# Patient Record
Sex: Female | Born: 1950 | Race: Black or African American | Hispanic: No | Marital: Married | Smoking: Never smoker
Health system: Southern US, Community
[De-identification: ages and names within clinical notes are randomized; demographics above are authoritative.]

## PROBLEM LIST (undated history)

## (undated) DIAGNOSIS — G8929 Other chronic pain: Secondary | ICD-10-CM

## (undated) DIAGNOSIS — M25569 Pain in unspecified knee: Secondary | ICD-10-CM

## (undated) DIAGNOSIS — N644 Mastodynia: Secondary | ICD-10-CM

## (undated) DIAGNOSIS — M47819 Spondylosis without myelopathy or radiculopathy, site unspecified: Secondary | ICD-10-CM

## (undated) DIAGNOSIS — F5101 Primary insomnia: Secondary | ICD-10-CM

## (undated) DIAGNOSIS — M2550 Pain in unspecified joint: Secondary | ICD-10-CM

## (undated) DIAGNOSIS — E875 Hyperkalemia: Secondary | ICD-10-CM

## (undated) DIAGNOSIS — Z1231 Encounter for screening mammogram for malignant neoplasm of breast: Secondary | ICD-10-CM

## (undated) DIAGNOSIS — F419 Anxiety disorder, unspecified: Secondary | ICD-10-CM

## (undated) DIAGNOSIS — J40 Bronchitis, not specified as acute or chronic: Secondary | ICD-10-CM

## (undated) DIAGNOSIS — J019 Acute sinusitis, unspecified: Secondary | ICD-10-CM

## (undated) DIAGNOSIS — K219 Gastro-esophageal reflux disease without esophagitis: Secondary | ICD-10-CM

## (undated) DIAGNOSIS — M47816 Spondylosis without myelopathy or radiculopathy, lumbar region: Secondary | ICD-10-CM

## (undated) DIAGNOSIS — E782 Mixed hyperlipidemia: Secondary | ICD-10-CM

## (undated) DIAGNOSIS — M5442 Lumbago with sciatica, left side: Secondary | ICD-10-CM

## (undated) DIAGNOSIS — R7989 Other specified abnormal findings of blood chemistry: Secondary | ICD-10-CM

## (undated) DIAGNOSIS — J42 Unspecified chronic bronchitis: Principal | ICD-10-CM

## (undated) DIAGNOSIS — R109 Unspecified abdominal pain: Secondary | ICD-10-CM

## (undated) DIAGNOSIS — M5416 Radiculopathy, lumbar region: Secondary | ICD-10-CM

## (undated) DIAGNOSIS — G4700 Insomnia, unspecified: Secondary | ICD-10-CM

## (undated) DIAGNOSIS — Z139 Encounter for screening, unspecified: Secondary | ICD-10-CM

## (undated) DIAGNOSIS — R413 Other amnesia: Secondary | ICD-10-CM

## (undated) DIAGNOSIS — I10 Essential (primary) hypertension: Secondary | ICD-10-CM

## (undated) DIAGNOSIS — F1910 Other psychoactive substance abuse, uncomplicated: Secondary | ICD-10-CM

## (undated) DIAGNOSIS — F329 Major depressive disorder, single episode, unspecified: Secondary | ICD-10-CM

## (undated) DIAGNOSIS — F32A Depression, unspecified: Secondary | ICD-10-CM

## (undated) DIAGNOSIS — R569 Unspecified convulsions: Secondary | ICD-10-CM

## (undated) DIAGNOSIS — G610 Guillain-Barre syndrome: Secondary | ICD-10-CM

## (undated) HISTORY — PX: JOINT REPLACEMENT: SHX530

---

## 2009-05-08 LAB — LIPID PANEL
Cholesterol, Total: 212 mg/dl — ABNORMAL HIGH (ref <200)
HDL: 40 mg/dL (ref 40–60)
LDL Calculated: 137 mg/dl — ABNORMAL HIGH (ref <100)
Triglycerides: 174 mg/dl — ABNORMAL HIGH (ref <150)
VLDL Cholesterol Calculated: 35 mg/dl

## 2009-05-08 LAB — CBC WITH AUTO DIFFERENTIAL
Basophils %: 0.7 % (ref 0.0–2.0)
Basophils Absolute: 0.1 10*3 (ref 0.0–0.2)
Eosinophils %: 2.5 % (ref 0.0–5.0)
Eosinophils Absolute: 0.2 10*3 (ref 0.0–0.6)
Granulocyte Absolute Count: 5.9 10*3 (ref 1.7–7.7)
Hematocrit: 41.6 % (ref 36.0–48.0)
Hemoglobin: 14.3 g/dL (ref 12.0–16.0)
Lymphocytes %: 33.5 % (ref 25.0–40.0)
Lymphocytes Absolute: 3.2 10*3 (ref 1.0–5.1)
MCH: 31.2 pg (ref 26–34)
MCHC: 34.5 g/dL (ref 31–36)
MCV: 90.6 fl (ref 80–100)
MPV: 7.2 fl (ref 5.0–10.5)
Monocytes %: 3.4 % (ref 0.0–8.0)
Monocytes Absolute: 0.3 10*3 (ref 0.0–0.95)
Platelets: 300 10*3 (ref 135–450)
RBC: 4.59 10*6 (ref 4.0–5.2)
RDW: 12.4 % (ref 11.5–14.5)
Segs Relative: 59.9 % (ref 42.0–63.0)
WBC: 9.7 10*3 (ref 4.0–11.0)

## 2009-05-08 LAB — MAGNESIUM: Magnesium: 2.4 mg/dL (ref 1.8–2.4)

## 2009-05-08 LAB — COMPREHENSIVE METABOLIC PANEL
ALT: 23 U/L (ref 10–40)
AST: 22 U/L (ref 15–37)
Albumin/Globulin Ratio: 1.6 (ref 1.1–2.2)
Albumin: 4.8 g/dL (ref 3.4–5.0)
Alkaline Phosphatase: 90 U/L (ref 25–100)
Anion Gap: 10
BUN: 12 mg/dL (ref 7–18)
CO2: 28 meq/L (ref 21–32)
Calcium: 9.6 mg/dL (ref 8.3–10.6)
Chloride: 105 meq/L (ref 99–110)
Creatinine: 0.7 mg/dL (ref 0.6–1.1)
GFR Est, African/Amer: >60
GFR, Estimated: >60 (ref >60)
Glucose: 117 mg/dL — ABNORMAL HIGH (ref 70–99)
Potassium: 3.9 meq/L (ref 3.5–5.1)
Sodium: 139 meq/L (ref 136–145)
Total Bilirubin: 0.6 mg/dL (ref 0.0–1.0)
Total Protein: 7.8 g/dL (ref 6.4–8.2)

## 2009-05-08 LAB — T4, FREE: T4 Free: 0.96 ng/dL (ref 0.9–1.8)

## 2009-05-08 LAB — TSH
T4 Free: 0.96 ng/dL (ref 0.9–1.8)
TSH: 0.91 u[IU]/mL (ref 0.35–5.5)

## 2009-05-08 LAB — SEDIMENTATION RATE, MANUAL: Sed Rate, Automated: 40 mm/h — ABNORMAL HIGH (ref 0–30)

## 2009-05-08 LAB — VITAMIN B12: Vitamin B-12: 559 pg/mL (ref 211–911)

## 2009-05-10 LAB — VITAMIN D 25 HYDROXY: Vit D, 25-Hydroxy: 11 ng/mL — ABNORMAL LOW (ref 30–80)

## 2010-04-28 ENCOUNTER — Emergency Department (HOSPITAL_BASED_OUTPATIENT_CLINIC_OR_DEPARTMENT_OTHER): Admission: EM | Admit: 2010-04-28 | Discharge: 2010-04-28 | Payer: Self-pay | Admitting: Emergency Medicine

## 2010-05-03 ENCOUNTER — Emergency Department (HOSPITAL_BASED_OUTPATIENT_CLINIC_OR_DEPARTMENT_OTHER): Admission: EM | Admit: 2010-05-03 | Discharge: 2010-05-03 | Payer: Self-pay | Admitting: Emergency Medicine

## 2010-07-21 LAB — LIPID PANEL
Cholesterol, Total: 268 mg/dl — ABNORMAL HIGH (ref <200)
HDL: 37 mg/dl — ABNORMAL LOW (ref 40–60)
LDL Direct: 168 mg/dl — ABNORMAL HIGH (ref <100)
Triglycerides: 344 mg/dl — ABNORMAL HIGH (ref <150)

## 2010-07-21 LAB — COMPREHENSIVE METABOLIC PANEL
ALT: 24 U/L (ref 10–40)
AST: 20 U/L (ref 15–37)
Albumin/Globulin Ratio: 1.7 (ref 1.1–2.2)
Albumin: 4.6 gm/dl (ref 3.4–5.0)
Alkaline Phosphatase: 80 U/L (ref 45–129)
Anion Gap: 6.7
BUN: 13 mg/dl (ref 7–18)
CO2: 28 mEq/L (ref 21–32)
Calcium: 9.8 mg/dl (ref 8.3–10.6)
Chloride: 106 mEq/L (ref 99–110)
Creatinine: 0.6 mg/dl (ref 0.6–1.1)
GFR Est, African/Amer: >60
GFR, Estimated: >60 (ref >60)
Glucose: 106 mg/dl — ABNORMAL HIGH (ref 70–99)
Potassium: 4.5 mEq/L (ref 3.5–5.1)
Sodium: 141 mEq/L (ref 136–145)
Total Bilirubin: 0.5 mg/dl (ref 0.0–1.0)
Total Protein: 7.3 gm/dl (ref 6.4–8.2)

## 2010-07-22 LAB — VITAMIN D 25 HYDROXY: Vit D, 25-Hydroxy: 24 ng/ml — ABNORMAL LOW (ref 30–80)

## 2012-12-07 ENCOUNTER — Encounter

## 2013-06-28 NOTE — Patient Instructions (Addendum)
INSTRUCTED PT NOT TO TAKE VALIUM AND RESTORIL AT THE SAME TIME.   Cichy therapy recommended for comfort.       Arthritis: After Your Visit  Your Care Instructions  Arthritis, also called osteoarthritis, is a breakdown of the cartilage that cushions your joints. When the cartilage wears down, your bones rub against each other. This causes pain and stiffness. Many people have some arthritis as they age. Arthritis most often affects the joints of the spine, hands, hips, knees, or feet.  You can take simple measures to protect your joints, ease your pain, and help y  Learning About Sleeping Well  What does sleeping well mean?  Sleeping well means getting enough sleep. How much sleep is enough varies among people.  The number of hours you sleep is not as important as how you feel when you wake up. If you do not feel refreshed, you probably need more sleep. Another sign of not getting enough sleep is feeling tired during the day.  The average total nightly sleep time is 7?? to 8 hours. Healthy adults may need a little more or a little less than this.  Why is getting enough sleep important?  Getting enough quality sleep is a basic part of good health. When your sleep suffers, your mood and your thoughts can suffer too. You may find yourself feeling more grumpy or stressed. Not getting enough sleep also can lead to serious problems, including injury, accidents, anxiety, and depression.  What might cause poor sleeping?  Many things can cause sleep problems, including:  ?? Stress. Stress can be caused by fear about a single event, such as giving a speech. Or you may have ongoing stress, such as worry about work or school.  ?? Depression, anxiety, and other mental or emotional conditions.  ?? Changes in your sleep habits or surroundings. This includes changes that happen where you sleep, such as noise, light, or sleeping in a different bed. It also includes changes in your sleep pattern, such as having jet lag or working a late  shift.  ?? Health problems, such as pain, breathing problems, and restless legs syndrome.  ?? Lack of regular exercise.  How can you help yourself?  Here are some tips that may help you sleep more soundly and wake up feeling more refreshed.  Your sleeping area  ?? Use your bedroom only for sleeping and sex. A bit of light reading may help you fall asleep. But if it doesn't, do your reading elsewhere in the house. Don't watch TV in bed.  ?? Be sure your bed is big enough to stretch out comfortably, especially if you have a sleep partner.  ?? Keep your bedroom quiet, dark, and cool. Use curtains, blinds, or a sleep mask to block out light. To block out noise, use earplugs, soothing music, or a "white noise" machine.  Your evening and bedtime routine  ?? Get regular exercise, but not within 3 to 4 hours before your bedtime.  ?? Create a relaxing bedtime routine. You might want to take a warm shower or bath, listen to soothing music, or drink a cup of noncaffeinated tea.  ?? Go to bed at the same time every night. And get up at the same time every morning, even if you feel tired.  What to avoid  ?? Limit caffeine (coffee, tea, caffeinated sodas) during the day, and don't have any for at least 4 to 6 hours before bedtime.  ?? Don't drink alcohol before bedtime. Alcohol can  cause you to wake up more often during the night.  ?? Don't smoke or use tobacco, especially in the evening. Nicotine can keep you awake.  ?? Don't take naps during the day, especially close to bedtime.  ?? Don't lie in bed awake for too long. If you can't fall asleep, or if you wake up in the middle of the night and can't get back to sleep within 15 minutes or so, get out of bed and go to another room until you feel sleepy.  ?? Don't take medicine right before bed that may keep you awake or make you feel hyper or energized. Your doctor can tell you if your medicine may do this and if you can take it earlier in the day.  If you can't sleep  ?? Imagine yourself in a  peaceful, pleasant scene. Focus on the details and feelings of being in a place that is relaxing.  ?? Get up and do a quiet or boring activity until you feel sleepy.  ?? Don't drink any liquids after 6 p.m. if you wake up often because you have to go to the bathroom.   Where can you learn more?   Go to [email or web address] and sign in to your MyChart account. Enter [phone] in the Search Health Information box to learn more about ???Learning About Sleeping Well.???    If you do not have an account, please click on the ???Sign Up Now??? link.     ?? 2006-2014 [employer]. Care instructions adapted under license by [facility]. This care instruction is for use with your licensed healthcare professional. If you have questions about a medical condition or this instruction, always ask your healthcare professional. Healthwise, Incorporated disclaims any warranty or liability for your use of this information.  Content Version: 10.0.273164; Last Revised: December 02, 2010            ou stay active.  Follow-up care is a key part of your treatment and safety. Be sure to make and go to all appointments, and call your doctor if you are having problems. It's also a good idea to know your test results and keep a list of the medicines you take.  How can you care for yourself at home?  ?? Stay at a healthy weight. Being overweight puts extra strain on your joints.  ?? Talk to your doctor or physical therapist about exercises that will help ease joint pain.  ?? Stretch. You may enjoy gentle forms of yoga to help keep your joints and muscles flexible.  ?? Walk instead of jog. Other types of exercise that are less stressful on the joints include riding a bicycle, swimming, or water exercise.  ?? Lift weights. Strong muscles help reduce stress on your joints. Stronger thigh muscles, for example, take some of the stress off of the knees and hips. Learn the right way to lift weights so you do not make joint pain  worse.  ?? Take your medicines exactly as prescribed. Call your doctor if you think you are having a problem with your medicine.  ?? Take pain medicines exactly as directed.  ?? If the doctor gave you a prescription medicine for pain, take it as prescribed.  ?? If you are not taking a prescription pain medicine, ask your doctor if you can take an over-the-counter medicine.  ?? Use a cane, crutch, walker, or another device if you need help to get around. These can help rest your joints. You also can use other  things to make life easier, such as a higher toilet seat and padded handles on kitchen utensils.  ?? Do not sit in low chairs, which can make it hard to get up.  ?? Put heat or cold on your sore joints as needed. Use whichever helps you most. You also can take turns with hot and cold packs.  ?? Apply heat 2 or 3 times a day for 20 to 30 minutes--using a heating pad, hot shower, or hot pack--to relieve pain and stiffness.  ?? Put ice or a cold pack on your sore joint for 10 to 20 minutes at a time. Put a thin cloth between the ice and your skin.  ?? Your doctor may suggest you try the supplements glucosamine and chondroitin. They are part of what makes up normal cartilage. Tell your doctor if you have diabetes, allergies to shellfish, or if you take warfarin (Coumadin).  When should you call for help?  Call your doctor now or seek immediate medical care if:  ?? You have sudden swelling, warmth, or pain in any joint.  ?? You have joint pain and a fever or rash.  ?? You have such bad pain that you cannot use a joint.  Watch closely for changes in your health, and be sure to contact your doctor if:  ?? You have mild joint symptoms that continue even with more than 6 weeks of care at home.  ?? You have stomach pain or other problems with your medicine.   Where can you learn more?   Go to [email or web address] and sign in to your MyChart account. Enter [phone] in the Search Health Information box to learn more about  ???Arthritis: After Your Visit.???    If you do not have an account, please click on the ???Sign Up Now??? link.     ?? 2006-2014 Healthwise, Inc[employer]. Care instructions adapted under license by [facility]. This care instruction is for use with your licensed healthcare professional. If you have questions about a medical condition or this instruction, always ask your healthcare professional. Healthwise, Incorporated disclaims any warranty or liability for your use of this information.  Content Version: 10.0.273164; Last Revised: May 13, 2012              diazepam  Pronunciation: dye AZ e pam  Brand: Valium  What is the most important information I should know about diazepam?    You should not use this medication if you are allergic to diazepam or similar medicines (Ativan, Klonopin, Restoril, Xanax, and others), or if you have myasthenia gravis, severe liver disease, narrow-angle glaucoma, a severe breathing problem, or sleep apnea.    Do not use diazepam if you are pregnant.  It could harm the unborn baby.    Do not start or stop taking diazepam during pregnancy without your doctor's advice. Diazepam may cause harm to an unborn baby, but having a seizure during pregnancy could harm both the mother and the baby. Tell your doctor right away if you become pregnant while taking diazepam for seizures.  Before you take diazepam, tell your doctor if you have glaucoma, asthma or other breathing problems, kidney or liver disease, seizures, or a history of drug or alcohol addiction, mental illness, depression, or suicidal thoughts.    Do not drink alcohol while taking diazepam.  This medication can increase the effects of alcohol.  Never take more of this medication than your doctor has prescribed. An overdose of diazepam can be fatal.  Diazepam may be habit forming. Never share diazepam with another person, especially someone with a history of drug abuse or addiction.  What is diazepam?  Diazepam is a  benzodiazepine (ben-zoe-dye-AZE-eh-peens). Diazepam affects chemicals in the brain that may become unbalanced and cause anxiety.  Diazepam is used to treat anxiety disorders, alcohol withdrawal symptoms, or muscle spasms. Diazepam is sometimes used with other medications to treat seizures.  Diazepam may also be used for purposes not listed in this medication guide.  What should I discuss with my healthcare provider before taking diazepam?    You should not use this medication if you are allergic to diazepam or similar drugs (Ativan, Klonopin, Restoril, Xanax, and others), or if you have:  ?? myasthenia gravis (a muscle weakness disorder);  ?? severe liver disease;  ?? narrow-angle glaucoma;  ?? a severe breathing problem; or  ?? sleep apnea (breathing stops during sleep).  To make sure diazepam is safe for you, tell your doctor if you have any of these conditions:  ?? open-angle glaucoma;  ?? asthma, emphysema, bronchitis, chronic obstructive pulmonary disorder (COPD), or other breathing problems;  ?? kidney or liver disease;  ?? epilepsy or other seizure disorder;  ?? a history of mental illness, depression, or suicidal thoughts or behavior; or  ?? a history of drug or alcohol addiction.    Diazepam may be habit forming. Never share diazepam with another person, especially someone with a history of drug abuse or addiction. Keep the medication in a place where others cannot get to it.    FDA pregnancy category D. Do not use diazepam if you are pregnant.  It could harm the unborn baby. Use effective birth control, and tell your doctor if you become pregnant during treatment. Diazepam may cause low blood pressure, breathing problems, or addiction and withdrawal symptoms in a newborn if the mother takes the medication during pregnancy.    Do not start or stop taking diazepam during pregnancy without your doctor's advice. Diazepam may cause harm to an unborn baby, but having a seizure during pregnancy could harm both the mother  and the baby. Tell your doctor right away if you become pregnant while taking diazepam for seizures.    Diazepam can pass into breast milk and may harm a nursing baby. You should not breast-feed while using this medicine.    Do not give this medication to a child younger than 39 months old.  The sedative effects of diazepam may last longer in older adults. Accidental falls are common in elderly patients who take benzodiazepines. Use caution to avoid falling or accidental injury while you are taking diazepam.  How should I take diazepam?  Follow all directions on your prescription label. Your doctor may occasionally change your dose to make sure you get the best results. Do not take this medicine in larger or smaller amounts or for longer than recommended.  Measure liquid medicine with a special dose-measuring spoon or medicine cup. If you do not have a dose-measuring device, ask your pharmacist for one.  Diazepam should be used for only a short time. Do not take this medication for longer than 12 weeks (3 months) without your doctor's advice.  Do not stop using diazepam suddenly, or you could have increased seizures or unpleasant withdrawal symptoms. Ask your doctor how to safely stop using diazepam.    Call your doctor at once if you feel that this medicine is not working as well as usual, or if you think you need to  use more than usual.  While using diazepam, you may need frequent blood tests at your doctor's office.    Store at room temperature away from moisture, heat, and light.  Keep track of the amount of medicine used from each new bottle. Diazepam is a drug of abuse and you should be aware if anyone is using your medicine improperly or without a prescription.  What happens if I miss a dose?  Take the missed dose as soon as you remember. Skip the missed dose if it is almost time for your next scheduled dose. Do not take extra medicine to make up the missed dose.  What happens if I overdose?    Seek emergency  medical attention or call the Poison Help line at [phone]. An overdose of diazepam can be fatal.  Overdose symptoms may include extreme drowsiness, loss of balance or coordination, limp or weak muscles, or fainting.  What should I avoid while taking diazepam?    This medication may impair your thinking or reactions. Be careful if you drive or do anything that requires you to be alert.    Do not drink alcohol while taking diazepam.  This medication can increase the effects of alcohol.  Grapefruit and grapefruit juice may interact with diazepam and lead to unwanted side effects. Discuss the use of grapefruit products with your doctor.  What are the possible side effects of diazepam?    Get emergency medical help if you have any of these signs of an allergic reaction: hives; difficult breathing; swelling of your face, lips, tongue, or throat.    Call your doctor at once if you have:  ?? confusion, hallucinations, unusual thoughts or behavior;  ?? unusual risk-taking behavior, decreased inhibitions, no fear of danger;  ?? depressed mood, thoughts of suicide or hurting yourself;  ?? hyperactivity, agitation, aggression, hostility;  ?? new or worsening seizures;  ?? weak or shallow breathing;  ?? a feeling like you might pass out;  ?? muscle twitching, tremor;  ?? loss of bladder control; or  ?? little or no urinating.  Common side effects may include:  ?? memory problems;  ?? drowsiness, tired feeling;  ?? dizziness, spinning sensation;  ?? feeling restless or irritable;  ?? muscle weakness;  ?? nausea, constipation;  ?? drooling or dry mouth, slurred speech;  ?? blurred vision, double vision;  ?? mild skin rash, itching; or  ?? loss of interest in sex.  This is not a complete list of side effects and others may occur. Call your doctor for medical advice about side effects. You may report side effects to FDA at 1-800-FDA-1088. You may report side effects to FDA at 1-800-FDA-1088.  What other drugs will affect diazepam?    Taking  diazepam with other drugs that make you sleepy or slow your breathing can cause dangerous or life-threatening side effects. Ask your doctor before taking diazepam with a sleeping pill, narcotic pain medicine, muscle relaxer, or medicine for anxiety, depression, or seizures.  Tell your doctor about all medicines you use, and those you start or stop using during your treatment with diazepam, especially:  ?? cimetidine;  ?? omeprazole;  ?? phenytoin;  ?? an antibiotic--clarithromycin, erythromycin, telithromycin;  ?? an antidepressant such as fluoxetine, fluoxetine, and others;  ?? antifungal medicine--itraconazole, ketoconazole, voriconazole;  ?? heart or blood pressure medication such as diltiazem, nicardipine, quinidine, verapamil, and others; or  ?? HIV/AIDS medicine--atazanavir, delavirdine, fosamprenavir, indinavir, nelfinavir, saquinavir, or ritonavir.  This list is not complete. Other drugs may  interact with diazepam, including prescription and over-the-counter medicines, vitamins, and herbal products. Not all possible interactions are listed in this medication guide.  Where can I get more information?  Your pharmacist can provide more information about diazepam.    Remember, keep this and all other medicines out of the reach of children, never share your medicines with others, and use this medication only for the indication prescribed.  Every effort has been made to ensure that the information provided by [employer]') is accurate, up-to-date, and complete, but no guarantee is made to that effect. Drug information contained herein may be time sensitive. [employer] information has been compiled for use by healthcare practitioners and consumers in the [place] and therefore [employer] does not warrant that uses outside of the [place] are appropriate, unless specifically indicated otherwise. [employer]'s drug information does not endorse drugs, diagnose patients or recommend therapy. [employer]'s drug  information is an [employer] corporate to assist licensed healthcare practitioners in caring for their patients and/or to serve consumers viewing this service as a supplement to, and not a substitute for, the expertise, skill, knowledge and judgment of healthcare practitioners. The absence of a warning for a given drug or drug combination in no way should be construed to indicate that the drug or drug combination is safe, effective or appropriate for any given patient. [employer] does not assume any responsibility for any aspect of healthcare administered with the aid of information [employer] provides. The information contained herein is not intended to cover all possible uses, directions, precautions, warnings, drug interactions, allergic reactions, or adverse effects. If you have questions about the drugs you are taking, check with your doctor, nurse or pharmacist.  Copyright [id number] [employer] Version: 12.01. Revision date: 01/14/2012.  This information does not replace the advice of a doctor. Healthwise, Incorporated disclaims any warranty or liability for your use of this information.   Content Version: 10.0.273164        temazepam  Pronunciation: te MAZ e pam  Brand: Restoril  What is the most important information I should know about temazepam?    Temazepam may cause a severe allergic reaction. Stop taking temazepam and get emergency medical help if you have any of these signs of an allergic reaction: hives; difficulty breathing; swelling of your face, lips, tongue, or throat.    Take temazepam only when you are getting ready for several hours of sleep.  You may fall asleep very quickly after taking the medicine.  Some people using this medicine have engaged in activity such as driving, eating, or making phone calls and later having no memory of the activity.  If this happens to you, stop taking temazepam and talk with your doctor about another treatment for your sleep disorder.    Do not use  this medication if you are allergic to temazepam or to other benzodiazepines, such as alprazolam (Xanax), chlordiazepoxide (Librium), clorazepate (Tranxene), diazepam (Valium), lorazepam (Ativan), or triazolam (Halcion).    This medication can cause birth defects in an unborn baby, or withdrawal symptoms in a newborn. Do not use temazepam if you are pregnant.  Before taking temazepam, tell your doctor if you have any breathing problems, glaucoma, kidney or liver disease, myasthenia gravis, or a history of depression, suicidal thoughts, or addiction to drugs or alcohol.    Do not drink alcohol while taking temazepam. It can increase some of the side effects, and could possibly cause a fatal overdose.  Avoid using other medicines that make  you sleepy. They can add to sleepiness caused by temazepam.    Temazepam may be habit-forming and should be used only by the person it was prescribed for. Temazepam should never be shared with another person, especially someone who has a history of drug abuse or addiction. Keep the medication in a secure place where others cannot get to it.  What is temazepam?  Temazepam is in a group of drugs called benzodiazepines (ben-zoe-dye-AZE-eh-peens). Temazepam affects chemicals in the brain that may become unbalanced and cause sleep problems (insomnia).  Temazepam is used to treat insomnia symptoms, such as trouble falling or staying asleep.  Temazepam may also be used for other purposes not listed in this medication guide.  What should I discuss with my healthcare provider before taking temazepam?  Some people using this medicine have engaged in activity such as driving, eating, or making phone calls and later having no memory of the activity.  If this happens to you, stop taking temazepam and talk with your doctor about another treatment for your sleep disorder.    Do not use this medication if you are allergic to temazepam or to other benzodiazepines, such as alprazolam (Xanax),  chlordiazepoxide (Librium), clorazepate (Tranxene), diazepam (Valium), lorazepam (Ativan), or triazolam (Halcion).  Before taking temazepam, tell your doctor if you are allergic to any drugs, or if you have:  ?? asthma, emphysema, bronchitis, chronic obstructive pulmonary disorder (COPD), or other breathing problems;  ?? glaucoma;  ?? kidney or liver disease;  ?? myasthenia gravis;  ?? a history of depression or suicidal thoughts or behavior; or  ?? a history of drug or alcohol addiction.  If you have any of these conditions, you may need a dose adjustment or special tests to safely take temazepam.    Temazepam can cause birth defects in an unborn baby.  It may also cause addiction or withdrawal symptoms in a newborn if the mother takes temazepam late in pregnancy. Do not use temazepam if you are pregnant. Tell your doctor right away if you become pregnant during treatment. Use an effective form of birth control while you are using this medication.    Temazepam may pass into breast milk and could harm a nursing baby. Do not use this medication without telling your doctor if you are breast-feeding a baby.  The sedative effects of temazepam may last longer in older adults. Accidental falls are common in elderly patients who take benzodiazepines. Use caution to avoid falling or accidental injury while you are taking temazepam.    Do not give this medication to anyone under 19 years old.  How should I take temazepam?  Take this medication exactly as it was prescribed for you. Do not take the medication in larger amounts, or take it for longer than recommended by your doctor. Follow the directions on your prescription label.    Take temazepam only when you are getting ready for several hours of sleep.  You may fall asleep very quickly after taking the medicine.    Contact your doctor if this medicine seems to stop working as well in helping you fall asleep and stay asleep.    Temazepam should be used for only a short time to  treat insomnia. After 7 to 10 nights of use, talk with your doctor about whether or not you should keep taking temazepam.  Your insomnia symptoms may return when you stop using temazepam after using it over a long period of time. You may need to use less and less  before you stop the medication completely.    Temazepam may be habit-forming and should be used only by the person it was prescribed for. Temazepam should never be shared with another person, especially someone who has a history of drug abuse or addiction. Keep the medication in a secure place where others cannot get to it.    Store temazepam at room temperature away from moisture and heat.  Keep track of how many tablets have been used from each new bottle of this medicine. Benzodiazepines are drugs of abuse and you should be aware if any person in the household is using this medicine improperly or without a prescription.  What happens if I miss a dose?  Since temazepam is taken as needed, you are not likely to be on a dosing schedule. Take temazepam only when you have time for several hours of sleep.  What happens if I overdose?    Seek emergency medical attention if you think you have used too much of this medicine. An overdose of temazepam can be fatal, especially if taken with alcohol.  Overdose symptoms may include extreme drowsiness, confusion, muscle weakness, slurred speech, tremors, a slow heartbeat, shallow breathing, feeling light-headed, fainting, seizure (black-out or convulsions), and coma.  What should I avoid while taking temazepam?    Do not drink alcohol while you are taking temazepam. It can increase some of the side effects, and could possibly cause a fatal overdose.    Temazepam can cause side effects that may impair your thinking or reactions. Be careful if you drive or do anything that requires you to be awake and alert.  What are the possible side effects of temazepam?    Temazepam may cause a severe allergic reaction. Stop taking  temazepam and get emergency medical help if you have any of these signs of an allergic reaction: hives; difficulty breathing; swelling of your face, lips, tongue, or throat.    Get emergency medical help if you have any of these signs of an allergic reaction: hives; difficulty breathing; swelling of your face, lips, tongue, or throat.    Stop using temazepam and call your doctor at once if you have any of these serious side effects:  ?? weak or shallow breathing;  ?? fast or pounding heartbeats;  ?? confusion, slurred speech, unusual thoughts or behavior;  ?? hallucinations, agitation, aggression;  ?? thoughts of suicide or hurting yourself;  ?? restless muscle movements in your eyes, tongue, jaw, or neck;  ?? pale skin, easy bruising or bleeding, unusual weakness;  ?? fever, chills, body aches, flu symptoms;  ?? problems with urination; or  ?? nausea, stomach pain, low fever, loss of appetite, dark urine, clay-colored stools, jaundice (yellowing of the skin or eyes).  Less serious side effects may include:  ?? daytime drowsiness (or during hours when you are not normally sleeping);  ?? amnesia or forgetfulness;  ?? muscle weakness, lack of balance or coordination;  ?? numbness, burning, pain, or tingly feeling;  ?? headache, blurred vision, depressed mood;  ?? feeling nervous, excited, or irritable;  ?? nausea, vomiting, stomach discomfort; or  ?? dry mouth, increased thirst.  This is not a complete list of side effects and others may occur. Tell your doctor about any unusual or bothersome side effect. You may report side effects to FDA at 1-800-FDA-1088.  What other drugs will affect temazepam?    Before using temazepam, tell your doctor if you regularly use other medicines that make you sleepy (such as cold  or allergy medicine, narcotic pain medicine, sleeping pills, muscle relaxers, and medicine for seizures, depression, or anxiety). They can add to sleepiness caused by temazepam.  Before taking temazepam, tell your doctor if you  are using any of the following drugs:  ?? fluvoxamine (Luvox);  ?? itraconazole (Sporanox);  ?? ketoconazole (Nizoral); or  ?? nefazodone (Serzone).  This list is not complete and there may be other drugs that can interact with temazepam. Tell your doctor about all the prescription and over-the-counter medications you use. This includes vitamins, minerals, herbal products, and drugs prescribed by other doctors. Do not start using a new medication without telling your doctor.  Where can I get more information?  Your pharmacist can provide more information about temazepam.    Remember, keep this and all other medicines out of the reach of children, never share your medicines with others, and use this medication only for the indication prescribed.  Every effort has been made to ensure that the information provided by [employer]') is accurate, up-to-date, and complete, but no guarantee is made to that effect. Drug information contained herein may be time sensitive. [employer] information has been compiled for use by healthcare practitioners and consumers in the [place] and therefore [employer] does not warrant that uses outside of the [place] are appropriate, unless specifically indicated otherwise. [employer]'s drug information does not endorse drugs, diagnose patients or recommend therapy. [employer]'s drug information is an [employer] corporate to assist licensed healthcare practitioners in caring for their patients and/or to serve consumers viewing this service as a supplement to, and not a substitute for, the expertise, skill, knowledge and judgment of healthcare practitioners. The absence of a warning for a given drug or drug combination in no way should be construed to indicate that the drug or drug combination is safe, effective or appropriate for any given patient. [employer] does not assume any responsibility for any aspect of healthcare administered with the aid of information [employer]  provides. The information contained herein is not intended to cover all possible uses, directions, precautions, warnings, drug interactions, allergic reactions, or adverse effects. If you have questions about the drugs you are taking, check with your doctor, nurse or pharmacist.  Copyright [phone] [employer] Version: 8.03. Revision date: 12/11/2009.  This information does not replace the advice of a doctor. [employer] disclaims any warranty or liability for your use of this information.   Content Version: 10.0.273164        Anxiety Disorder: After Your Visit  Your Care Instructions  Anxiety is a normal reaction to stress. Difficult situations can cause you to have symptoms such as sweaty palms and a nervous feeling.  In an anxiety disorder, the symptoms are far more severe. Constant worry, muscle tension, trouble sleeping, nausea and diarrhea, and other symptoms can make normal daily activities difficult or impossible. These symptoms may occur for no reason, and they can affect your work, school, or social life. Medicines, counseling, and self-care can all help.  Follow-up care is a key part of your treatment and safety. Be sure to make and go to all appointments, and call your doctor if you are having problems. It's also a good idea to know your test results and keep a list of the medicines you take.  How can you care for yourself at home?  ?? Take medicines exactly as directed. Call your doctor if you think you are having a problem with your medicine.  ?? Go to your counseling  sessions and follow-up appointments.  ?? Recognize and accept your anxiety. Then, when you are in a situation that makes you anxious, say to yourself, "This is not an emergency. I feel uncomfortable, but I am not in danger. I can keep going even if I feel anxious."  ?? Be kind to your body:  ?? Relieve tension with exercise or a massage.  ?? Get enough rest.  ?? Avoid alcohol, caffeine, nicotine, and illegal drugs. They can  increase your anxiety level and cause sleep problems.  ?? Learn and do relaxation techniques. See below for more about these techniques.  ?? Engage your mind. Get out and do something you enjoy. Go to a funny movie, or take a walk or hike. Plan your day. Having too much or too little to do can make you anxious.  ?? Keep a record of your symptoms. Discuss your fears with a good friend or family member, or join a support group for people with similar problems. Talking to others sometimes relieves stress.  ?? Get involved in social groups, or volunteer to help others. Being alone sometimes makes things seem worse than they are.  ?? Get at least 30 minutes of exercise on most days of the week to relieve stress. Walking is a good choice. You also may want to do other activities, such as running, swimming, cycling, or playing tennis or team sports.  Relaxation techniques  Do relaxation exercises 10 to 20 minutes a day. You can play soothing, relaxing music while you do them, if you wish.  ?? Tell others in your house that you are going to do your relaxation exercises. Ask them not to disturb you.  ?? Find a comfortable place, away from all distractions and noise.  ?? Lie down on your back, or sit with your back straight.  ?? Focus on your breathing. Make it slow and steady.  ?? Breathe in through your nose. Breathe out through either your nose or mouth.  ?? Breathe deeply, filling up the area between your navel and your rib cage. Breathe so that your belly goes up and down.  ?? Do not hold your breath.  ?? Breathe like this for 5 to 10 minutes. Notice the feeling of calmness throughout your whole body.  As you continue to breathe slowly and deeply, relax by doing the following for another 5 to 10 minutes:  ?? Tighten and relax each muscle group in your body. You can begin at your toes and work your way up to your head.  ?? Imagine your muscle groups relaxing and becoming heavy.  ?? Empty your mind of all thoughts.  ?? Let yourself relax  more and more deeply.  ?? Become aware of the state of calmness that surrounds you.  ?? When your relaxation time is over, you can bring yourself back to alertness by moving your fingers and toes and then your hands and feet and then stretching and moving your entire body. Sometimes people fall asleep during relaxation, but they usually wake up shortly afterward.  ?? Always give yourself time to return to full alertness before you drive a car or do anything that might cause an accident if you are not fully alert. Never play a relaxation tape while you drive a car.  When should you call for help?  Call 911 anytime you think you may need emergency care. For example, call if:  ?? You feel you cannot stop from hurting yourself or someone else.  Watch closely for  changes in your health, and be sure to contact your doctor if:  ?? You have anxiety or fear that affects your life.  ?? You have symptoms of anxiety that are new or different from those you had before.   Where can you learn more?   Go to [email or web address] and sign in to your MyChart account. Enter P754 in the Search Health Information box to learn more about ???Anxiety Disorder: After Your Visit.???    If you do not have an account, please click on the ???Sign Up Now??? link.     ?? 2006-2014 [employer]. Care instructions adapted under license by [facility]. This care instruction is for use with your licensed healthcare professional. If you have questions about a medical condition or this instruction, always ask your healthcare professional. Health[employer], Incorporated disclaims any warranty or liability for your use of this information.  Content Version: 10.0.273164; Last Revised: August 02, 2012

## 2013-06-28 NOTE — Progress Notes (Signed)
Subjective:      Patient ID: Lisa York is a 62 y.o. female.    Leg Pain   The incident occurred more than 1 week ago. There was no injury mechanism. The pain is present in the left knee, left leg, left ankle and left foot. The quality of the pain is described as aching. The pain is at a severity of 8/10. The pain is moderate. The pain has been fluctuating since onset. She reports no foreign bodies present. Nothing aggravates the symptoms.     Pt c/o left leg knee pain radiating down leg for "a while." Hx left knee surgery 6-7 years ago. Denies any acute injury. Had xray of left knee in the past 6 months with no acute changes. Has taken aleve, tylenol. Ibuprofen with no relief. Has tried ice with some relief but not totally gone.      Anxiety  Patient is here for evaluation of anxiety.  She has the following anxiety symptoms: insomnia, racing thoughts, shaking. Onset of symptoms was approximately several years ago.  Symptoms have been gradually worsening since that time. She denies current suicidal and homicidal ideation. Family history significant for anxiety and depression.Possible organic causes contributing are: none. Risk factors: positive family history in  brother(s) and sister(s) Previous treatment includes medication Prozac.   She complains of the following medication side effects: none.    Pt reports that she has had increasing anxiety over the past year. Lost her husband 1 year ago. States that she used to take valium once a day over 10 years ago and it helped. States that she stopped taking it because she didn't think that she needed it. Also reports that she has difficulty sleeping.     Review of Systems   Constitutional: Negative for fever, chills and fatigue.   HENT: Negative.    Eyes: Negative.    Respiratory: Negative.  Negative for chest tightness and shortness of breath.    Cardiovascular: Negative.    Gastrointestinal: Negative.    Endocrine: Negative.    Genitourinary: Negative.     Musculoskeletal: Positive for myalgias (left knee pain). Negative for back pain and joint swelling.   Skin: Negative.    Allergic/Immunologic: Negative.    Neurological: Negative for light-headedness and headaches.   Hematological: Negative.    Psychiatric/Behavioral: Positive for sleep disturbance (difficulty sleeping). Negative for confusion. The patient is nervous/anxious (increasing since husband's death).        Objective:   Physical Exam   Constitutional: She is oriented to person, place, and time. Vital signs are normal. She appears well-developed and well-nourished. No distress.   HENT:   Head: Normocephalic.   Right Ear: Hearing, tympanic membrane, external ear and ear canal normal.   Left Ear: Hearing, tympanic membrane, external ear and ear canal normal.   Nose: Nose normal.   Mouth/Throat: Uvula is midline.   Eyes: Conjunctivae are normal. Pupils are equal, round, and reactive to light.   Neck: Trachea normal and normal range of motion. Neck supple. No thyromegaly present.   Cardiovascular: Normal rate, regular rhythm, S1 normal, S2 normal and normal heart sounds.    Pulmonary/Chest: Effort normal and breath sounds normal. No respiratory distress. She has no wheezes.   Abdominal: Soft. Bowel sounds are normal. She exhibits no distension. There is no tenderness.   Musculoskeletal:        Left knee: She exhibits decreased range of motion. She exhibits no swelling. Tenderness found.   Neurological: She is alert and  oriented to person, place, and time.   Skin: Skin is warm, dry and intact. No rash noted. She is not diaphoretic.   Psychiatric: Her speech is normal and behavior is normal. Her mood appears anxious. She exhibits a depressed mood. She expresses no homicidal and no suicidal ideation. She expresses no suicidal plans and no homicidal plans.     BP 158/90   Pulse 110   Wt 184 lb 12.8 oz (83.825 kg)   SpO2 94%    Assessment/Plan      Lisa York was seen today for leg pain and anxiety.    Diagnoses and  associated orders for this visit:    Arthritis: Previous left knee xray revealed mild to moderate arthritis. Pt performs ROM exercises in swimming pool daily. Meds as below. Discussed Obryan therapy and f/u if no relief.    - meloxicam (MOBIC) 15 MG tablet; Take 1 tablet by mouth daily.    Anxiety: Increasing anxiety over the past year after husband's death. Refilled Paxil. Prescribed Valium. Instructed pt not to take with Restoril. Pt verbalized understanding. Pt to f/u if symprtoms do not improve.   - PARoxetine (PAXIL) 40 MG tablet; Take 1 tablet by mouth daily.  - diazepam (VALIUM) 2 MG tablet; Take 1 tablet by mouth every 12 hours as needed for Anxiety for 10 days.    Insomnia, persistent: C/O increasing difficulty sleeping. Recommended taking Paxil in the morning instead of at night. Had taken Restoril in the past with good results. Prescribed Restoril. F/u if no improvement.   - temazepam (RESTORIL) 15 MG capsule; Take 1 capsule by mouth nightly as needed for Sleep for 14 days.

## 2013-08-07 NOTE — Patient Instructions (Addendum)
Knee Pain: After Your Visit  Your Care Instructions  Overuse is often the cause of knee pain. Other causes are climbing stairs, kneeling, or other activities that use the knee. Everyday wear and tear, especially as you get older, also can cause knee pain.  Rest, along with home treatment, often relieves pain and allows your knee to heal. If you have a serious knee injury, you may need tests and treatment.  Follow-up care is a key part of your treatment and safety. Be sure to make and go to all appointments, and call your doctor if you are having problems. It???s also a good idea to know your test results and keep a list of the medicines you take.  How can you care for yourself at home?  ?? Take pain medicines exactly as directed.  ?? If the doctor gave you a prescription medicine for pain, take it as prescribed.  ?? If you are not taking a prescription pain medicine, ask your doctor if you can take an over-the-counter medicine.  ?? Do not take two or more pain medicines at the same time unless the doctor told you to. Many pain medicines contain acetaminophen, which is Tylenol. Too much acetaminophen (Tylenol) can be harmful.  ?? Rest and protect your knee. Take a break from any activity that may cause pain.  ?? Put ice or a cold pack on your knee for 10 to 20 minutes at a time. Put a thin cloth between the ice and your skin.  ?? Prop up a sore knee on a pillow when you ice it or anytime you sit or lie down for the next 3 days. Try to keep it above the level of your heart. This will help reduce swelling.  ?? If your doctor recommends an elastic bandage, sleeve, or other type of support for your knee, wear it as directed.  ?? If your knee is not swollen, you can put moist heat, a heating pad, or a warm cloth on your knee.  ?? After several days of rest, you can begin gentle exercise of your knee.  ?? Reach and stay at a healthy weight. Extra weight can strain the joints, especially the knees and hips, and make the pain worse.  Losing even a few pounds may help.  When should you call for help?  Call 911 anytime you think you may need emergency care. For example, call if:  ?? You have sudden chest pain and shortness of breath, or you cough up blood.  Call your doctor now or seek immediate medical care if:  ?? You have severe or increasing pain.  ?? Your leg or foot turns cold or changes color.  ?? You cannot stand or put weight on your knee.  ?? Your knee looks twisted or bent out of shape.  ?? You cannot move your knee.  ?? You have signs of infection, such as:  ?? Increased pain, swelling, warmth, or redness.  ?? Red streaks leading from the sore area.  ?? Pus draining from a place on your knee.  ?? A fever.  ?? You have signs of a blood clot in your leg, such as:  ?? Pain in your calf, back of the knee, thigh, or groin.  ?? Redness and swelling in your leg or groin.  Watch closely for changes in your health, and be sure to contact your doctor if:  ?? Your knee feels numb or tingly.  ?? You do not get better as expected.  ??   You have any new symptoms, such as swelling.  ?? You have bruises from a knee injury that last longer than 2 weeks.   Where can you learn more?   Go to [email or web address] and sign in to your MyChart account. Enter K195 in the Search Health Information box to learn more about ???Knee Pain: After Your Visit.???    If you do not have an account, please click on the ???Sign Up Now??? link.     ?? 2006-2014 [employer], [employer]. Care instructions adapted under license by [facility]. This care instruction is for use with your licensed healthcare professional. If you have questions about a medical condition or this instruction, always ask your healthcare professional. Health[employer], Inc[employer] disclaims any warranty or liability for your use of this information.  Content Version: 10.0.273164; Last Revised: February 16, 2012              acetaminophen and hydrocodone  Pronunciation: a SEET a MIN oh fen and hye droe  KOE done  Brand: Anexsia, Co-Gesic, Hycet, Lorcet 10/650, Lorcet Plus, Lortab 10/500, Lortab 2.5/500, Lortab 5/500, Lortab 7.5/500, Lortab Elixir, Maxidone, [place], [place], Vicodin, [place], [place], [place], [place]  What is the most important information I should know about acetaminophen and hydrocodone?    You should not use this medicine if you have recently used alcohol, sedatives, tranquilizers, or other narcotic medications.  Do not use this medicine if you have taken an MAO inhibitor in the past 14 days. A dangerous drug interaction could occur. MAO inhibitors include isocarboxazid, linezolid, phenelzine, rasagiline, selegiline, and tranylcypromine.    Do not take more of this medication than is recommended. An overdose of acetaminophen can damage your liver or cause death. Call your doctor at once if you have nausea, pain in your upper stomach, itching, loss of appetite, dark urine, clay-colored stools, or jaundice (yellowing of your skin or eyes).    In rare cases, acetaminophen may cause a severe skin reaction. Stop taking this medicine and call your doctor right away if you have skin redness or a rash that spreads and causes blistering and peeling.     This medicine may be habit forming. Never share acetaminophen and hydrocodone with another person, especially someone with a history of drug abuse or addiction.  What is acetaminophen and hydrocodone?  Hydrocodone is an opioid pain medication. An opioid is sometimes called a narcotic.  Acetaminophen is a less potent pain reliever that increases the effects of hydrocodone.  Acetaminophen and hydrocodone is a combination medicine used to relieve moderate to severe pain.  Acetaminophen and hydrocodone may also be used for purposes not listed in this medication guide.  What should I discuss with my healthcare provider before taking acetaminophen and hydrocodone?    You should not use this medicine if you are allergic to acetaminophen (Tylenol) or hydrocodone, or  if you have recently used alcohol, sedatives, tranquilizers, or other narcotic medications.  Do not use this medicine if you have taken an MAO inhibitor in the past 14 days. A dangerous drug interaction could occur. MAO inhibitors include isocarboxazid, linezolid, phenelzine, rasagiline, selegiline, and tranylcypromine.  To make sure this medicine is safe for you, tell your doctor if you have:  ?? liver disease, cirrhosis, or if you drink more than 3 alcoholic beverages per day;  ?? a history of alcoholism or drug addiction;  ?? diarrhea, inflammatory bowel disease;  ?? bowel obstruction, severe constipation;  ?? a colostomy or ileostomy;  ?? kidney disease;  ?? low  blood pressure, or if you are dehydrated;  ?? a history of head injury, brain tumor, or stroke; or  ?? asthma, COPD, sleep apnea, or other breathing disorders.    Hydrocodone may be habit forming. Never share this medicine with another person, especially someone with a history of drug abuse or addiction. Keep the medication in a place where others cannot get to it.    FDA pregnancy category C. It is not known whether this medication is harmful to an unborn baby, but it could cause breathing problems or addiction/withdrawal symptoms in a newborn. Tell your doctor if you are pregnant or plan to become pregnant while using this medication.    Acetaminophen and hydrocodone can pass into breast milk and may harm a nursing baby. You should not breast-feed while using this medicine.  How should I take acetaminophen and hydrocodone?  Follow all directions on your prescription label. Never take this medicine in larger amounts, or for longer than prescribed. An overdose can damage your liver or cause death. Tell your doctor if the medicine seems to stop working as well in relieving your pain.  Measure liquid medicine with a special dose-measuring spoon or medicine cup. If you do not have a dose-measuring device, ask your pharmacist for one.    Drink 6 to 8 full glasses of  water daily to help prevent constipation while you are taking acetaminophen and hydrocodone. Do not use a stool softener (laxative) without first asking your doctor.  This medicine can cause unusual results with certain urine tests. Tell any doctor who treats you that you are using acetaminophen and hydrocodone.  If you need surgery, tell the surgeon ahead of time that you are using this medicine. You may need to stop using the medicine for a short time.    Do not stop using this medicine suddenly after long-term use, or you could have unpleasant withdrawal symptoms. Ask your doctor how to avoid withdrawal symptoms when you stop using acetaminophen and hydrocodone.    Store at room temperature away from moisture and heat.  Keep track of the amount of medicine used from each new bottle. Hydrocodone is a drug of abuse and you should be aware if anyone is using your medicine improperly or without a prescription.  Always check your bottle to make sure you have received the correct pills (same brand and type) of medicine prescribed by your doctor. Ask the pharmacist if you have any questions about the medicine you receive at the pharmacy.  What happens if I miss a dose?  Since acetaminophen and hydrocodone is taken as needed, you may not be on a dosing schedule. If you are taking the medication regularly, take the missed dose as soon as you remember. Skip the missed dose if it is almost time for your next scheduled dose. Do not use extra medicine to make up the missed dose.  What happens if I overdose?    Seek emergency medical attention or call the Poison Help line at [phone]. An overdose of acetaminophen and hydrocodone can be fatal.   The first signs of an acetaminophen overdose include loss of appetite, nausea, vomiting, stomach pain, sweating, and confusion or weakness. Later symptoms may include pain in your upper stomach, dark urine, and yellowing of your skin or the whites of your eyes.  Overdose symptoms  may also include extreme drowsiness, pinpoint pupils, cold and clammy skin, muscle weakness, fainting, weak pulse, slow heart rate, coma, blue lips, shallow breathing, or no breathing  What should I avoid while taking acetaminophen and hydrocodone?    This medication may impair your thinking or reactions. Avoid driving or operating machinery until you know how acetaminophen and hydrocodone will affect you.    Ask a doctor or pharmacist before using any other cold, allergy, pain, or sleep medication. Acetaminophen (sometimes abbreviated as APAP) is contained in many combination medicines. Taking certain products together can cause you to get too much acetaminophen which can lead to a fatal overdose. Check the label to see if a medicine contains acetaminophen or APAP.    Avoid drinking alcohol. It may increase your risk of liver damage while taking acetaminophen.  What are the possible side effects of acetaminophen and hydrocodone?    Get emergency medical help if you have any of these signs of an allergic reaction: hives; difficulty breathing; swelling of your face, lips, tongue, or throat.    In rare cases, acetaminophen may cause a severe skin reaction that can be fatal. This could occur even if you have taken acetaminophen in the past and had no reaction. Stop taking this medicine and call your doctor right away if you have skin redness or a rash that spreads and causes blistering and peeling.  If you have this type of reaction, you should never again take any medicine that contains acetaminophen.    Call your doctor at once if you have:  ?? shallow breathing, slow heartbeat;  ?? a light-headed feeling, like you might pass out;  ?? confusion, unusual thoughts or behavior;  ?? seizure (convulsions);  ?? easy bruising or bleeding; or  ?? nausea, upper stomach pain, itching, loss of appetite, dark urine, clay-colored stools, jaundice (yellowing of the skin or eyes).  Common side effects include:  ?? drowsiness;  ?? upset  stomach, constipation;  ?? headache;  ?? blurred vision; or  ?? dry mouth.  This is not a complete list of side effects and others may occur. Call your doctor for medical advice about side effects. You may report side effects to FDA at 1-800-FDA-1088.  What other drugs will affect acetaminophen and hydrocodone?    Taking this medicine with other drugs that make you sleepy or slow your breathing can cause dangerous or life-threatening side effects. Ask your doctor before taking acetaminophen and hydrocodone with a sleeping pill, narcotic pain medicine, muscle relaxer, or medicine for anxiety, depression, or seizures.  Other drugs may interact with acetaminophen and hydrocodone, including prescription and over-the-counter medicines, vitamins, and herbal products. Tell each of your health care providers about all medicines you use now and any medicine you start or stop using.  Where can I get more information?  Your pharmacist can provide more information about acetaminophen and hydrocodone.    Remember, keep this and all other medicines out of the reach of children, never share your medicines with others, and use this medication only for the indication prescribed.  Every effort has been made to ensure that the information provided by [employer] ('[employer]') is accurate, up-to-date, and complete, but no guarantee is made to that effect. Drug information contained herein may be time sensitive. [employer] information has been compiled for use by healthcare practitioners and consumers in the [place] and therefore [employer] does not warrant that uses outside of the [place] are appropriate, unless specifically indicated otherwise. [employer]'s drug information does not endorse drugs, diagnose patients or recommend therapy. [employer]'s drug information is an Armed forces technical officer designed to assist licensed healthcare practitioners in caring for their patients  and/or to serve consumers viewing this service as a supplement  to, and not a substitute for, the expertise, skill, knowledge and judgment of healthcare practitioners. The absence of a warning for a given drug or drug combination in no way should be construed to indicate that the drug or drug combination is safe, effective or appropriate for any given patient. [employer] does not assume any responsibility for any aspect of healthcare administered with the aid of information [employer] provides. The information contained herein is not intended to cover all possible uses, directions, precautions, warnings, drug interactions, allergic reactions, or adverse effects. If you have questions about the drugs you are taking, check with your doctor, nurse or pharmacist.  Copyright [phone] [employer] Version: 14.01. Revision date: 08/04/2012.  This information does not replace the advice of a doctor. Healthwise, Incorporated disclaims any warranty or liability for your use of this information.   Content Version: 10.0.273164

## 2013-08-07 NOTE — Progress Notes (Signed)
Subjective:      Patient ID: Lisa York is a 62 y.o. female.    HPI  Chief Complaint   Patient presents with   ??? Knee Pain     Follow up on left knee pain, pt states that it is not getting any better.   Patient with persistent moderate left knee pain X 5 month. Patient describes pain as aching with intermittent sharp pain. Patient had previous x-ray that was negative. Patient denies any know injuries or incident at which pain began.     Review of Systems   Constitutional: Negative for fever and chills.   Respiratory: Negative.    Cardiovascular: Negative.    Musculoskeletal: Positive for arthralgias (left knee).   Skin: Negative.    Neurological: Negative.    Psychiatric/Behavioral: Positive for sleep disturbance (pain in knee is affecting sleep).       Objective:   Physical Exam   Constitutional: Lisa York is oriented to person, place, and time. Lisa York appears well-developed and well-nourished.   HENT:   Head: Normocephalic and atraumatic.   Eyes: Conjunctivae and EOM are normal. Pupils are equal, round, and reactive to light.   Neck: Normal range of motion. Neck supple.   Cardiovascular: Normal rate, regular rhythm and normal heart sounds.    Pulmonary/Chest: Effort normal and breath sounds normal.   Abdominal: Soft. Bowel sounds are normal.   Musculoskeletal:        Left knee: Lisa York exhibits decreased range of motion and swelling. Lisa York exhibits no erythema. Tenderness found. Lateral joint line tenderness noted.   Neurological: Lisa York is alert and oriented to person, place, and time.   Skin: Skin is warm and dry.   Psychiatric: Lisa York has a normal mood and affect.   Vitals reviewed.    BP 134/88   Pulse 98   Temp(Src) 98.9 ??F (37.2 ??C) (Tympanic)   Wt 182 lb (82.555 kg)   SpO2 99%    Assessment/Plan:      Lisa York was seen today for knee pain.    Diagnoses and associated orders for this visit:    Left lateral knee pain:  Patient with persistent left knee pain for approx. 6 months. Edema and tenderness left lateral joint line.  Patient reports a "catch" in knee intermittently when walking. Patient prefers Dr. Kathyrn Sheriff. Discussed Arrey and meds as below for pain. Patient to f/u as needed.  - [facility]- Kathyrn Sheriff, [place], MD (Sports, Shoulder)  - HYDROcodone-acetaminophen (NORCO) 10-325 MG per tablet; Take 1 tablet by mouth every 6 hours as needed for Pain for up to 14 days.

## 2013-08-21 NOTE — Telephone Encounter (Signed)
Pt states that her ortho appt was scheduled for today at 3:00pm and that it was cancelled by their office, can she get a refill of her pain medicine?

## 2013-08-22 NOTE — Telephone Encounter (Signed)
Clydie Braun were you able to reach [facility]?

## 2013-08-22 NOTE — Telephone Encounter (Signed)
Pt called regarding her medication refill request, pls call her to let her know if it is or isn't going to be filled so she may pick the script up

## 2013-08-22 NOTE — Telephone Encounter (Signed)
Pt has an appt next week with [facility]

## 2013-08-22 NOTE — Telephone Encounter (Signed)
Rx was sent to [facility]

## 2013-08-22 NOTE — Telephone Encounter (Signed)
Noted.

## 2013-08-22 NOTE — Telephone Encounter (Signed)
Dr Clotilde Dieter office confirms that they did have to cancel Dr Clotilde Dieter patients yesterday, he had an emergency.

## 2013-08-30 NOTE — Progress Notes (Signed)
Chief Complaint:  Knee Pain      History of Present Illness:  Lisa York is a 62 y.o. female here regarding left knee pain. The pain is located lateral and has been present for the last 2 months or so.  She states she notices some catching with walking and the knee sometimes gives out.  She also notices a painful bump on the top of her patella, this bump Saenz electric shocks across her knee when touched.  She denies any specific injury.  She states that ice does tend to help.  She was given Mobic and Norco from her PCP, however she was unable to tolerate the Mobic.  She underwent a knee scope of this knee in 2007 by Dr. Kathyrn Sheriff    Medical History:  Patient's medications, allergies, past medical, surgical, social and family histories were reviewed and updated as appropriate.    Review of Systems:  Pertinent items are noted in HPI.  Relevant review of systems reviewed and available in the patient's chart    Vital Signs:  Filed Vitals:    08/30/13 1403   BP: 118/69   Pulse: 93       Pain Assessment:       General Exam:   Constitutional: Patient is adequately groomed with no evidence of malnutrition  Lymphatic: The lymphatic examination bilaterally reveals all areas to be without enlargement or induration.  Vascular: Examination reveals no swelling or calf tenderness.  Peripheral pulses are palpable and 2+.    Knee Examination  Inspection:   No gross deformities noted. no swelling noted. No erythema or ecchymosis. Skin is intact.    Palpation:  Tenderness to palpation along the lateral joint line.  Tenderness palpation over the patella, a bump is palpated because 1 cm in diameter.    Range of Motion:  Range of motion    Strength:  5 out of 5 strength with flexion and extension.    Special Tests:  ACL, MCL, PCL, LCL are intact with stress exam.  There is crepitus noted with range of motion under the patella.  A positive grind test.  No pain or catching is able to be elicited with McMurray's.    Skin: There are no  rashes, ulcerations or lesions.    Gait: Normal    Reflex: Not tested    Additional Examinations:  Right Lower Extremity: Examination of the right lower extremity does not show any tenderness, deformity or injury.  Range of motion is unremarkable.  There is no gross instability.  There are no rashes, ulcerations or lesions.  Strength and tone are normal.      XR KNEE LEFT STANDARD EXTENDED VW  Diagnostic Test Findings:  Xrays obtained and reviewed in office  4 views of the left knee show minimal osteoarthritic changes, there is slight narrowing in the medial joint line bilaterally.  No evidence of fracture or dislocation.      Assessment :  Left knee pain possible meniscus pathology.  Superficial cyst over left patella possible neuroma.    Impression:  Encounter Diagnosis   Name Primary?   ??? Left knee pain Yes       Office Procedures:  Orders Placed This Encounter   Procedures   ??? Knee Left 4V or more     [id number]     Order Specific Question:  Reason for exam:     Answer:  Pain       Treatment Plan:  At this time we would like to  order an MRI for further investigation intra-articularly to rule out lateral meniscus tear.  She'll follow up with [place] after her MRI.  We discussed with her about the cyst over her patella, at this point we instructed her to avoid painful activities such as kneeling.    Lubertha South DO dictating for Dr. Kathyrn Sheriff

## 2013-08-30 NOTE — Patient Instructions (Signed)
Knee: Exercises  Your Care Instructions  Here are some examples of exercises for your knee. Start each exercise slowly. Ease off the exercise if you start to have pain.  Your doctor or physical therapist will tell you when you can start these exercises and which ones will work best for you.  How to do the exercises  Quad sets    1. Sit with your leg straight and supported on the floor or a firm bed. (If you feel discomfort in the front or back of your knee, place a small towel roll under your knee.)  2. Tighten the muscles on top of your thigh by pressing the back of your knee flat down to the floor. (If you feel discomfort under your kneecap, place a small towel roll under your knee.)  3. Hold for about 6 seconds, then rest for up to 10 seconds.  4. Do 8 to 12 repetitions several times a day.  Straight-leg raises to the front    1. Lie on your back with your good knee bent so that your foot rests flat on the floor. Your injured leg should be straight. Make sure that your low back has a normal curve. You should be able to slip your flat hand in between the floor and the small of your back, with your palm touching the floor and your back touching the back of your hand.  2. Tighten the thigh muscles in the injured leg by pressing the back of your knee flat down to the floor. Hold your knee straight.  3. Keeping the thigh muscles tight, lift your injured leg up so that your heel is about 12 inches off the floor. Hold for about 6 seconds and then lower slowly.  4. Do 8 to 12 repetitions, 3 times a day.  Straight-leg raises to the outside    1. Lie on your side, with your injured leg on top.  2. Tighten the front thigh muscles of your injured leg to keep your knee straight.  3. Keep your hip and your leg straight in line with the rest of your body, and keep your knee pointing forward. Do not drop your hip back.  4. Lift your injured leg straight up toward the ceiling, about 12 inches off the floor. Hold for about 6  seconds, then slowly lower your leg.  5. Do 8 to 12 repetitions.  Straight-leg raises to the back    1. Lie on your stomach, and lift your leg straight up behind you (toward the ceiling).  2. Lift your toes about 6 inches off the floor, hold for about 6 seconds, then lower slowly.  3. Do 8 to 12 repetitions.  Straight-leg raises to the inside    1. Lie on the side of your body with the injured leg.  2. You can either prop your other (good) leg up on a chair, or you can bend your good knee and put that foot in front of your injured knee. Do not drop your hip back.  3. Tighten the muscles on the front of your thigh to straighten your injured knee.  4. Keep your kneecap pointing forward, and lift your whole leg up toward the ceiling about 6 inches. Hold for about 6 seconds, then lower slowly.  5. Do 8 to 12 repetitions.  Heel dig bridging    1. Lie on your back with both knees bent and your ankles bent so that only your heels are digging into the floor. Your knees   should be bent about 90 degrees.  2. Then push your heels into the floor, squeeze your buttocks, and lift your hips off the floor until your shoulders, hips, and knees are all in a straight line.  3. Hold for about 6 seconds as you continue to breathe normally, and then slowly lower your hips back down to the floor and rest for up to 10 seconds.  4. Do 8 to 12 repetitions.  Hamstring curls    1. Lie on your stomach with your knees straight. If your kneecap is uncomfortable, roll up a washcloth and put it under your leg just above your kneecap.  2. Lift the foot of your injured leg by bending the knee so that you bring the foot up toward your buttock. If this motion hurts, try it without bending your knee quite as far. This may help you avoid any painful motion.  3. Slowly lower your leg back to the floor.  4. Do 8 to 12 repetitions.  5. With permission from your doctor or physical therapist, you may also want to add a cuff weight to your ankle (not more than  5 pounds). With weight, you do not have to lift your leg more than 12 inches to get a hamstring workout.  Shallow standing knee bends    Note: Do this exercise only if you have very little pain; if you have no clicking, locking, or giving way if you have an injured knee; and if it does not hurt while you are doing 8 to 12 repetitions.  1. Stand with your hands lightly resting on a counter or chair in front of you. Put your feet shoulder-width apart.  2. Slowly bend your knees so that you squat down like you are going to sit in a chair. Make sure your knees do not go in front of your toes.  3. Lower yourself about 6 inches. Your heels should remain on the floor at all times.  4. Rise slowly to a standing position.  Heel raises    1. Stand with your feet a few inches apart, with your hands lightly resting on a counter or chair in front of you.  2. Slowly raise your heels off the floor while keeping your knees straight.  3. Hold for about 6 seconds, then slowly lower your heels to the floor.  4. Do 8 to 12 repetitions several times during the day.  Follow-up care is a key part of your treatment and safety. Be sure to make and go to all appointments, and call your doctor if you are having problems. It's also a good idea to know your test results and keep a list of the medicines you take.   Where can you learn more?   Go to [email or web address] and sign in to your MyChart account. Enter Q615 in the Search Health Information box to learn more about ???Knee: Exercises.???    If you do not have an account, please click on the ???Sign Up Now??? link.     ?? 2006-2014 [employer]. Care instructions adapted under license by [facility]. This care instruction is for use with your licensed healthcare professional. If you have questions about a medical condition or this instruction, always ask your healthcare professional. [employer], Inc[employer] disclaims any warranty or liability for your  use of this information.  Content Version: 10.0.273164; Last Revised: November 11, 2012

## 2013-08-30 NOTE — Communication Body (Signed)
Assessment:      Plan:

## 2013-09-15 NOTE — Telephone Encounter (Signed)
Pt seen Dr. Kathyrn Sheriff, is waiting for approval of MRI. She fell a few days ago and is requesting a refill on the Norco

## 2013-09-21 MED ORDER — TEMAZEPAM 15 MG PO CAPS
15 MG | ORAL_CAPSULE | Freq: Every evening | ORAL | Status: AC | PRN
Start: 2013-09-21 — End: 2013-10-05

## 2013-09-21 MED ORDER — OMEPRAZOLE 40 MG PO CPDR
40 MG | ORAL_CAPSULE | Freq: Every day | ORAL | Status: DC
Start: 2013-09-21 — End: 2014-03-28

## 2013-09-21 MED ORDER — HYDROCODONE-ACETAMINOPHEN 10-325 MG PO TABS
10-325 MG | ORAL_TABLET | Freq: Four times a day (QID) | ORAL | Status: DC | PRN
Start: 2013-09-21 — End: 2013-11-10

## 2013-09-21 NOTE — Patient Instructions (Signed)
Learning About Sleeping Well  What does sleeping well mean?  Sleeping well means getting enough sleep. How much sleep is enough varies among people.  The number of hours you sleep is not as important as how you feel when you wake up. If you do not feel refreshed, you probably need more sleep. Another sign of not getting enough sleep is feeling tired during the day.  The average total nightly sleep time is 7?? to 8 hours. Healthy adults may need a little more or a little less than this.  Why is getting enough sleep important?  Getting enough quality sleep is a basic part of good health. When your sleep suffers, your mood and your thoughts can suffer too. You may find yourself feeling more grumpy or stressed. Not getting enough sleep also can lead to serious problems, including injury, accidents, anxiety, and depression.  What might cause poor sleeping?  Many things can cause sleep problems, including:  ?? Stress. Stress can be caused by fear about a single event, such as giving a speech. Or you may have ongoing stress, such as worry about work or school.  ?? Depression, anxiety, and other mental or emotional conditions.  ?? Changes in your sleep habits or surroundings. This includes changes that happen where you sleep, such as noise, light, or sleeping in a different bed. It also includes changes in your sleep pattern, such as having jet lag or working a late shift.  ?? Health problems, such as pain, breathing problems, and restless legs syndrome.  ?? Lack of regular exercise.  How can you help yourself?  Here are some tips that may help you sleep more soundly and wake up feeling more refreshed.  Your sleeping area   ?? Use your bedroom only for sleeping and sex. A bit of light reading may help you fall asleep. But if it doesn't, do your reading elsewhere in the house. Don't watch TV in bed.  ?? Be sure your bed is big enough to stretch out comfortably, especially if you have a sleep partner.  ?? Keep your bedroom quiet,  dark, and cool. Use curtains, blinds, or a sleep mask to block out light. To block out noise, use earplugs, soothing music, or a "white noise" machine.  Your evening and bedtime routine   ?? Get regular exercise, but not within 3 to 4 hours before your bedtime.  ?? Create a relaxing bedtime routine. You might want to take a warm shower or bath, listen to soothing music, or drink a cup of noncaffeinated tea.  ?? Go to bed at the same time every night. And get up at the same time every morning, even if you feel tired.  What to avoid   ?? Limit caffeine (coffee, tea, caffeinated sodas) during the day, and don't have any for at least 4 to 6 hours before bedtime.  ?? Don't drink alcohol before bedtime. Alcohol can cause you to wake up more often during the night.  ?? Don't smoke or use tobacco, especially in the evening. Nicotine can keep you awake.  ?? Don't take naps during the day, especially close to bedtime.  ?? Don't lie in bed awake for too long. If you can't fall asleep, or if you wake up in the middle of the night and can't get back to sleep within 15 minutes or so, get out of bed and go to another room until you feel sleepy.  ?? Don't take medicine right before bed that may keep you   awake or make you feel hyper or energized. Your doctor can tell you if your medicine may do this and if you can take it earlier in the day.  If you can't sleep   ?? Imagine yourself in a peaceful, pleasant scene. Focus on the details and feelings of being in a place that is relaxing.  ?? Get up and do a quiet or boring activity until you feel sleepy.  ?? Don't drink any liquids after 6 p.m. if you wake up often because you have to go to the bathroom.   Where can you learn more?   Go to [email or web address] and sign in to your MyChart account. Enter [phone] in the Search Health Information box to learn more about ???Learning About Sleeping Well.???    If you do not have an account, please click on the ???Sign Up Now??? link.     ??  2006-2014 [employer]. Care instructions adapted under license by [facility]. This care instruction is for use with your licensed healthcare professional. If you have questions about a medical condition or this instruction, always ask your healthcare professional. [employer], [employer] disclaims any warranty or liability for your use of this information.  Content Version: 10.1.311062; Current as of: March 08, 2013              Knee Pain: After Your Visit  Your Care Instructions     Overuse is often the cause of knee pain. Other causes are climbing stairs, kneeling, or other activities that use the knee. Everyday wear and tear, especially as you get older, also can cause knee pain.  Rest, along with home treatment, often relieves pain and allows your knee to heal. If you have a serious knee injury, you may need tests and treatment.  Follow-up care is a key part of your treatment and safety. Be sure to make and go to all appointments, and call your doctor if you are having problems. It???s also a good idea to know your test results and keep a list of the medicines you take.  How can you care for yourself at home?  ?? Take pain medicines exactly as directed.  ?? If the doctor gave you a prescription medicine for pain, take it as prescribed.  ?? If you are not taking a prescription pain medicine, ask your doctor if you can take an over-the-counter medicine.  ?? Do not take two or more pain medicines at the same time unless the doctor told you to. Many pain medicines contain acetaminophen, which is Tylenol. Too much acetaminophen (Tylenol) can be harmful.  ?? Rest and protect your knee. Take a break from any activity that may cause pain.  ?? Put ice or a cold pack on your knee for 10 to 20 minutes at a time. Put a thin cloth between the ice and your skin.  ?? Prop up a sore knee on a pillow when you ice it or anytime you sit or lie down for the next 3 days. Try to keep it above the level of your  heart. This will help reduce swelling.  ?? If your doctor recommends an elastic bandage, sleeve, or other type of support for your knee, wear it as directed.  ?? If your knee is not swollen, you can put moist heat, a heating pad, or a warm cloth on your knee.  ?? After several days of rest, you can begin gentle exercise of your knee.  ?? Reach and stay at a healthy weight.  Extra weight can strain the joints, especially the knees and hips, and make the pain worse. Losing even a few pounds may help.  When should you call for help?  Call 911 anytime you think you may need emergency care. For example, call if:  ?? You have sudden chest pain and shortness of breath, or you cough up blood.  Call your doctor now or seek immediate medical care if:  ?? You have severe or increasing pain.  ?? Your leg or foot turns cold or changes color.  ?? You cannot stand or put weight on your knee.  ?? Your knee looks twisted or bent out of shape.  ?? You cannot move your knee.  ?? You have signs of infection, such as:  ?? Increased pain, swelling, warmth, or redness.  ?? Red streaks leading from the sore area.  ?? Pus draining from a place on your knee.  ?? A fever.  ?? You have signs of a blood clot in your leg, such as:  ?? Pain in your calf, back of the knee, thigh, or groin.  ?? Redness and swelling in your leg or groin.  Watch closely for changes in your health, and be sure to contact your doctor if:  ?? Your knee feels numb or tingly.  ?? You do not get better as expected.  ?? You have any new symptoms, such as swelling.  ?? You have bruises from a knee injury that last longer than 2 weeks.   Where can you learn more?   Go to [email or web address] and sign in to your MyChart account. Enter K195 in the Search Health Information box to learn more about ???Knee Pain: After Your Visit.???    If you do not have an account, please click on the ???Sign Up Now??? link.     ?? 2006-2014 [employer]. Care instructions adapted under license  by [facility]. This care instruction is for use with your licensed healthcare professional. If you have questions about a medical condition or this instruction, always ask your healthcare professional. [employer], [employer] disclaims any warranty or liability for your use of this information.  Content Version: 10.1.311062; Current as of: February 16, 2012              Gastroesophageal Reflux Disease (GERD): After Your Visit  Your Care Instructions     Gastroesophageal reflux disease (GERD) is the backward flow of stomach acid into the esophagus. The esophagus is the tube that leads from your throat to your stomach. A one-way valve prevents the stomach acid from moving up into this tube. When you have GERD, this valve does not close tightly enough.  If you have mild GERD symptoms including heartburn, you may be able to control the problem with antacids or over-the-counter medicine. Changing your diet, losing weight, and making other lifestyle changes can also help reduce symptoms.  Follow-up care is a key part of your treatment and safety. Be sure to make and go to all appointments, and call your doctor if you are having problems. It???s also a good idea to know your test results and keep a list of the medicines you take.  How can you care for yourself at home?  ?? Take your medicines exactly as prescribed. Call your doctor if you think you are having a problem with your medicine.  ?? Your doctor may recommend over-the-counter medicine. For mild or occasional indigestion, antacids, such as Tums, Gaviscon, Mylanta, or Maalox, may help. Your doctor also may  recommend over-the-counter acid reducers, such as Pepcid AC, Tagamet HB, Zantac 75, or Prilosec. Read and follow all instructions on the label. If you use these medicines often, talk with your doctor.  ?? Change your eating habits.  ?? It???s best to eat several small meals instead of two or three large meals.  ?? After you eat, wait 2 to 3 hours before you  lie down.  ?? Chocolate, mint, and alcohol can make GERD worse.  ?? Spicy foods, foods that have a lot of acid (like tomatoes and oranges), and coffee can make GERD symptoms worse in some people. If your symptoms are worse after you eat a certain food, you may want to stop eating that food to see if your symptoms get better.  ?? Do not smoke or chew tobacco. Smoking can make GERD worse. If you need help quitting, talk to your doctor about stop-smoking programs and medicines. These can increase your chances of quitting for good.  ?? If you have GERD symptoms at night, raise the head of your bed 6 to 8 inches by putting the frame on blocks or placing a foam wedge under the head of your mattress. (Adding extra pillows does not work.)  ?? Do not wear tight clothing around your middle.  ?? Lose weight if you need to. Losing just 5 to 10 pounds can help.  When should you call for help?  Call your doctor now or seek immediate medical care if:  ?? You have new or different belly pain.  ?? Your stools are black and tarlike or have streaks of blood.  Watch closely for changes in your health, and be sure to contact your doctor if:  ?? Your symptoms have not improved after 2 days.  ?? Food seems to catch in your throat or chest.   Where can you learn more?   Go to [email or web address] and sign in to your MyChart account. Enter [phone] in the Search Health Information box to learn more about ???Gastroesophageal Reflux Disease (GERD): After Your Visit.???    If you do not have an account, please click on the ???Sign Up Now??? link.     ?? 2006-2014 [employer], [employer]. Care instructions adapted under license by [facility]. This care instruction is for use with your licensed healthcare professional. If you have questions about a medical condition or this instruction, always ask your healthcare professional. [employer] disclaims any warranty or liability for your use of this information.  Content  Version: 10.1.311062; Current as of: May 13, 2012

## 2013-09-21 NOTE — Progress Notes (Signed)
Subjective:      Patient ID: Lisa York is a 62 y.o. female.    Leg Pain   The incident occurred more than 1 week ago. There was no injury mechanism. The pain is present in the left leg. The quality of the pain is described as aching. The pain has been intermittent since onset. She reports no foreign bodies present. Nothing aggravates the symptoms.   pt has seen Dr. Kathyrn Sheriff who told pt she has possible cyst on kneecap. Patient reports ortho is working on getting MRI approved through [employer].  Pt also wants flu and pneumonia inj  Review of Systems   Constitutional: Negative for fever, chills and appetite change.   HENT: Positive for congestion and sneezing.    Respiratory: Negative.    Cardiovascular: Negative.    Gastrointestinal: Negative.    Genitourinary: Negative.    Musculoskeletal: Positive for arthralgias (left knee).   Skin: Negative.    Allergic/Immunologic: Positive for environmental allergies.   Psychiatric/Behavioral: Negative.        Objective:   Physical Exam   Constitutional: She is oriented to person, place, and time. Vital signs are normal. She appears well-developed and well-nourished.   HENT:   Head: Normocephalic and atraumatic.   Eyes: Conjunctivae and EOM are normal. Pupils are equal, round, and reactive to light.   Neck: Normal range of motion. Neck supple.   Cardiovascular: Normal rate, regular rhythm and normal heart sounds.    Pulmonary/Chest: Effort normal and breath sounds normal.   Abdominal: Soft. Bowel sounds are normal.   Musculoskeletal: Normal range of motion.        Left knee: She exhibits swelling. She exhibits no erythema. Tenderness found.   Neurological: She is alert and oriented to person, place, and time.   Skin: Skin is warm, dry and intact.   Psychiatric: She has a normal mood and affect.   Vitals reviewed.    BP 138/80   Pulse 104   Wt 185 lb (83.915 kg)   BMI 29.87 kg/m2   SpO2 95%    Assessment/Plan:      Lisa York was seen today for leg pain.    Diagnoses and  associated orders for this visit:    Left knee pain: Discussed pain management contract that includes random drug urine tests and pill counts. Patient agreeable. Contract signed and patient provided with copy of contract. Discussed Buesing and to f/u with ortho as scheduled.   - HYDROcodone-acetaminophen (NORCO) 10-325 MG per tablet; Take 1 tablet by mouth every 6 hours as needed for Pain for up to 30 days.    Insomnia: ongoing issue. Discussed sleep hygiene. Also see patient instructions.   - temazepam (RESTORIL) 15 MG capsule; Take 1 capsule by mouth nightly as needed for Sleep for up to 14 days.    GERD (gastroesophageal reflux disease)  - omeprazole (PRILOSEC) 40 MG capsule; Take 1 capsule by mouth daily.

## 2013-10-09 ENCOUNTER — Encounter (HOSPITAL_BASED_OUTPATIENT_CLINIC_OR_DEPARTMENT_OTHER): Payer: Self-pay | Admitting: Emergency Medicine

## 2013-10-09 ENCOUNTER — Emergency Department (HOSPITAL_BASED_OUTPATIENT_CLINIC_OR_DEPARTMENT_OTHER)
Admission: EM | Admit: 2013-10-09 | Discharge: 2013-10-10 | Disposition: A | Discharge status: Home / Self Care | Payer: Medicare HMO | Attending: Emergency Medicine | Admitting: Emergency Medicine

## 2013-10-09 ENCOUNTER — Emergency Department (HOSPITAL_BASED_OUTPATIENT_CLINIC_OR_DEPARTMENT_OTHER): Payer: Medicare HMO

## 2013-10-09 DIAGNOSIS — F10239 Alcohol dependence with withdrawal, unspecified: Secondary | ICD-10-CM | POA: Insufficient documentation

## 2013-10-09 DIAGNOSIS — R4182 Altered mental status, unspecified: Secondary | ICD-10-CM | POA: Insufficient documentation

## 2013-10-09 DIAGNOSIS — Z880 Allergy status to penicillin: Secondary | ICD-10-CM | POA: Insufficient documentation

## 2013-10-09 DIAGNOSIS — R4789 Other speech disturbances: Secondary | ICD-10-CM | POA: Insufficient documentation

## 2013-10-09 DIAGNOSIS — R5381 Other malaise: Secondary | ICD-10-CM | POA: Insufficient documentation

## 2013-10-09 DIAGNOSIS — Z8669 Personal history of other diseases of the nervous system and sense organs: Secondary | ICD-10-CM | POA: Insufficient documentation

## 2013-10-09 DIAGNOSIS — F10939 Alcohol use, unspecified with withdrawal, unspecified: Secondary | ICD-10-CM

## 2013-10-09 HISTORY — DX: Unspecified convulsions: R56.9

## 2013-10-09 HISTORY — DX: Guillain-Barre syndrome: G61.0

## 2013-10-09 LAB — RAPID URINE DRUG SCREEN, HOSP PERFORMED
Amphetamines: NOT DETECTED
Barbiturates: NOT DETECTED
Cocaine: NOT DETECTED
Opiates: NOT DETECTED
Tetrahydrocannabinol: NOT DETECTED

## 2013-10-09 LAB — URINALYSIS, ROUTINE W REFLEX MICROSCOPIC
Bilirubin Urine: NEGATIVE
Ketones, ur: NEGATIVE mg/dL
Nitrite: NEGATIVE
Protein, ur: 100 mg/dL — AB
Specific Gravity, Urine: 1.015 (ref 1.005–1.030)
Urobilinogen, UA: 1 mg/dL (ref 0.0–1.0)

## 2013-10-09 LAB — CBC WITH DIFFERENTIAL/PLATELET
Basophils Relative: 0 % (ref 0–1)
Eosinophils Relative: 0 % (ref 0–5)
Hemoglobin: 10.6 g/dL — ABNORMAL LOW (ref 12.0–15.0)
Lymphocytes Relative: 23 % (ref 12–46)
MCH: 35.6 pg — ABNORMAL HIGH (ref 26.0–34.0)
Monocytes Absolute: 0.7 10*3/uL (ref 0.1–1.0)
Neutro Abs: 3.1 10*3/uL (ref 1.7–7.7)
Neutrophils Relative %: 63 % (ref 43–77)
Platelets: 109 10*3/uL — ABNORMAL LOW (ref 150–400)
RBC: 2.98 MIL/uL — ABNORMAL LOW (ref 3.87–5.11)
RDW: 13.4 % (ref 11.5–15.5)
WBC: 5 10*3/uL (ref 4.0–10.5)

## 2013-10-09 LAB — GLUCOSE, CAPILLARY: Glucose-Capillary: 160 mg/dL — ABNORMAL HIGH (ref 70–99)

## 2013-10-09 LAB — COMPREHENSIVE METABOLIC PANEL
BUN: 6 mg/dL (ref 6–23)
CO2: 26 mEq/L (ref 19–32)
Calcium: 8.9 mg/dL (ref 8.4–10.5)
Creatinine, Ser: 0.7 mg/dL (ref 0.50–1.10)
GFR calc Af Amer: >90 mL/min (ref >90)
GFR calc non Af Amer: 90 mL/min — ABNORMAL LOW (ref >90)
Glucose, Bld: 206 mg/dL — ABNORMAL HIGH (ref 70–99)
Sodium: 133 mEq/L — ABNORMAL LOW (ref 135–145)
Total Protein: 7.7 g/dL (ref 6.0–8.3)

## 2013-10-09 LAB — URINE MICROSCOPIC-ADD ON

## 2013-10-09 LAB — TROPONIN I: Troponin I: <0.3 ng/mL (ref <0.30)

## 2013-10-09 LAB — ETHANOL: Alcohol, Ethyl (B): <11 mg/dL (ref 0–11)

## 2013-10-09 MED ORDER — ACETAMINOPHEN 325 MG PO TABS
650.0000 mg | ORAL_TABLET | Freq: Once | ORAL | Status: AC
  Administered 2013-10-09: 650 mg via ORAL
  Filled 2013-10-09: qty 2

## 2013-10-09 MED ORDER — SODIUM CHLORIDE 0.9 % IV BOLUS (SEPSIS)
1000.0000 mL | Freq: Once | INTRAVENOUS | Status: AC
  Administered 2013-10-09: 1000 mL via INTRAVENOUS

## 2013-10-09 NOTE — ED Notes (Signed)
Per Pt. Husband the Pt. Had a seizure for approx. 2-3 mins.  Pt. Not taking any seizure meds at present time per Pt. Husband.  Pt. Said when he came home from work the Pt. Was speaking fine and he left to get food and the pt. Was acting funny.

## 2013-10-09 NOTE — ED Notes (Signed)
Pt. Is alert and oriented with no distress noted.  Family at bedside of pt. Reports the pt. Is acting normal and is WNL.  Pt. Will be discharge home after Dr. Micheline Maze has given explanation of how to care for Pt. And when to return if needed.

## 2013-10-09 NOTE — ED Notes (Signed)
Pt. Brought in by family due to seizure in car.  Pt. Has history of seizures.  Pt. Is postictal at present time.  Eyes open and speaking abstract.

## 2013-10-09 NOTE — ED Notes (Signed)
Family at bedside. 

## 2013-10-09 NOTE — ED Provider Notes (Signed)
CSN: [id number]     Arrival date & time 10/09/13  2034 History   First MD Initiated Contact with Patient 10/09/13 2108     Chief Complaint  Patient presents with  . Seizures   (Consider location/radiation/quality/duration/timing/severity/associated sxs/prior Treatment) Patient is a 62 y.o. female presenting with altered mental status.  Altered Mental Status Presenting symptoms: disorientation and partial responsiveness   Presenting symptoms: no confusion   Severity:  Moderate Most recent episode:  Today Episode history:  Single Duration:  1 hour Timing:  Constant Progression:  Waxing and waning Chronicity:  New Context: alcohol use (recent decreased amt of ETOH intake)   Context: taking medications as prescribed, not a recent change in medication, not a recent illness and not a recent infection   Associated symptoms: seizures, slurred speech and weakness   Associated symptoms: no abdominal pain, no agitation, no bladder incontinence, no decreased appetite, no difficulty breathing, no fever, no headaches, no nausea, no palpitations and no vomiting   Associated symptoms comment:  Witnessed 203 min generalized seizure just PTA Seizures:    Seizure activity on arrival: no     Seizure type:  Grand mal   Duration:  3 minutes   Timing:  Once   Progression:  Improving   Chronicity:  Recurrent   Past Medical History  Diagnosis Date  . Guillain-Barre     in 1986 poss due to flu shot  . Seizures     last seizure was 10 yrs ago   Past Surgical History  Procedure Laterality Date  . Joint replacement      Left hip   No family history on file. History  Substance Use Topics  . Smoking status: Never Smoker   . Smokeless tobacco: Not on file  . Alcohol Use: Yes     Comment: per Husband the pt. has a drinking problem and he pours the alcohol out.   OB History   Grav Para Term Preterm Abortions TAB SAB Ect Mult Living                 Review of Systems  Constitutional: Positive  for activity change. Negative for fever, chills, diaphoresis, appetite change, fatigue and decreased appetite.  HENT: Negative for congestion, facial swelling, rhinorrhea and sore throat.   Eyes: Negative for photophobia and discharge.  Respiratory: Negative for cough, chest tightness and shortness of breath.   Cardiovascular: Negative for chest pain, palpitations and leg swelling.  Gastrointestinal: Negative for nausea, vomiting, abdominal pain and diarrhea.  Endocrine: Negative for polydipsia and polyuria.  Genitourinary: Negative for bladder incontinence, dysuria, frequency, difficulty urinating and pelvic pain.  Musculoskeletal: Negative for arthralgias, back pain, neck pain and neck stiffness.  Skin: Negative for color change and wound.  Allergic/Immunologic: Negative for immunocompromised state.  Neurological: Positive for seizures and weakness. Negative for facial asymmetry, numbness and headaches.  Hematological: Does not bruise/bleed easily.  Psychiatric/Behavioral: Negative for confusion and agitation.    Allergies  Penicillins  Home Medications  No current outpatient prescriptions on file. BP 149/82  Pulse 87  Temp(Src) 101 F (38.3 C) (Oral)  Resp 18  Ht 5' (1.524 m)  Wt 90 lb (40.824 kg)  BMI 17.58 kg/m2  SpO2 100% Physical Exam  Constitutional: She is oriented to person, place, and time. She appears well-developed and well-nourished. No distress.  HENT:  Head: Normocephalic and atraumatic.  Mouth/Throat: No oropharyngeal exudate.  Eyes: Pupils are equal, round, and reactive to light.  Neck: Normal range of motion. Neck supple.  Cardiovascular: Normal rate, regular rhythm and normal heart sounds.  Exam reveals no gallop and no friction rub.   No murmur heard. Pulmonary/Chest: Effort normal and breath sounds normal. No respiratory distress. She has no wheezes. She has no rales.  Abdominal: Soft. Bowel sounds are normal. She exhibits no distension and no mass. There  is no tenderness. There is no rebound and no guarding.  Musculoskeletal: Normal range of motion. She exhibits no edema and no tenderness.  Neurological: She is alert and oriented to person, place, and time. She has normal strength. No cranial nerve deficit or sensory deficit. She exhibits normal muscle tone. Gait abnormal. GCS eye subscore is 4. GCS verbal subscore is 3. GCS motor subscore is 6.  Falls to side with attempt to ambulate  Skin: Skin is warm and dry.  Psychiatric: She has a normal mood and affect.    ED Course  Procedures (including critical care time) Labs Review Labs Reviewed  GLUCOSE, CAPILLARY - Abnormal; Notable for the following:    Glucose-Capillary 160 (*)    All other components within normal limits  URINALYSIS, ROUTINE W REFLEX MICROSCOPIC - Abnormal; Notable for the following:    APPearance CLOUDY (*)    Hgb urine dipstick LARGE (*)    Protein, ur 100 (*)    Leukocytes, UA TRACE (*)    All other components within normal limits  COMPREHENSIVE METABOLIC PANEL - Abnormal; Notable for the following:    Sodium 133 (*)    Chloride 92 (*)    Glucose, Bld 206 (*)    AST 67 (*)    Alkaline Phosphatase 197 (*)    GFR calc non Af Amer 90 (*)    All other components within normal limits  CBC WITH DIFFERENTIAL - Abnormal; Notable for the following:    RBC 2.98 (*)    Hemoglobin 10.6 (*)    HCT 30.6 (*)    MCV 102.7 (*)    MCH 35.6 (*)    Platelets 109 (*)    Monocytes Relative 14 (*)    All other components within normal limits  URINE MICROSCOPIC-ADD ON - Abnormal; Notable for the following:    Bacteria, UA MANY (*)    All other components within normal limits  URINE CULTURE  ETHANOL  URINE RAPID DRUG SCREEN (HOSP PERFORMED)  APTT  TROPONIN I   Imaging Review Ct Head Wo Contrast  10/09/2013   CLINICAL DATA:  Slurred speech in ataxia. Code stroke.  Seizure.  EXAM: CT HEAD WITHOUT CONTRAST  TECHNIQUE: Contiguous axial images were obtained from the base of  the skull through the vertex without intravenous contrast.  COMPARISON:  03/13/2007  FINDINGS: Skull:No acute osseous abnormality. No lytic or blastic lesion.  Orbits: No acute abnormality.  Brain: Global brain atrophy. The cerebellum is more severely affected, likely related to history of alcohol use. No evidence of acute infarct, hemorrhage, hydrocephalus, or mass lesion. No shift or herniation. Ventriculomegaly in proportion to cerebral volume loss.  IMPRESSION: 1. No evidence of acute intracranial disease. 2. Arlys John atrophy, preferentially affecting the cerebellum.   Electronically Signed   By: Tiburcio Pea M.D.   On: 10/09/2013 21:40    EKG Interpretation     Ventricular Rate:  87 PR Interval:  142 QRS Duration: 58 QT Interval:  366 QTC Calculation: 440 R Axis:   62 Text Interpretation:  Normal sinus rhythm Possible Left atrial enlargement Borderline ECG No old tracing to compare  MDM   1. Alcohol withdrawal seizure    Pt is a 62 y.o. female with Pmhx as above, last seen nml 6:30 PM, pt came home from store, found pt standing in house, with slurred speech, difficulty walking, decreased responsiveness.  Pt placed in car and on way to ED had 2-3 mins generalized seizure activity that resolved just before arrival. Pt post-ictal, but improving. She is moving arms, legs equally, but cannot ambulate w/o falling, unable to speak in clear sentences.  Husband states pt heavy drinker and that he poured out all ETOH in house yesterday except for 4 beers that were left in the house. Given my concern for possible CVA, code CVA called and I have spoken to Dr. Thad Ranger w/ neurology who feels pt likely in post-ictal state upon husband's arrival home and does not feel symptoms likely represent CVA.  Will not transfer to [facility] at this time and will continue w/u.    11:13 Pt back to baseline mental status, able to ambulate w/ nml gait as witnessed by family, states she feels improved.  I  suspect ETOH withdraw seizures as cause of symptoms.  Pt has mild fever, but no clear source, Doubt meningitis, pna, UTI, no sign of skin or soft tissue infection.  I have requested that if pt wants to quit drinking again, she does so under physician supervision. Return precautions given for new or worsening symptoms including confusion, trouble speaking, trouble walking, confusion      Shanna Cisco, MD 10/10/13 1219

## 2013-10-10 NOTE — ED Notes (Signed)
Pt. Did  Not take the tylenol before leaving facility.  Pt. Family taking Pt. Home.  Will return the tylenol.

## 2013-10-11 LAB — URINE CULTURE: Colony Count: >=100000

## 2013-10-12 NOTE — Progress Notes (Signed)
Post ED Visit - Positive Culture Follow-up  Culture report reviewed by antimicrobial stewardship pharmacist: []  Wes Dulaney, Pharm.D., BCPS []  Celedonio Miyamoto, Pharm.D., BCPS []  Georgina Pillion, [address].D., BCPS []  [place], [address].D., BCPS, AAHIVP [x]  Estella Husk, Pharm.D., BCPS, AAHIVP  Positive Urine culture No urinary symptoms or diagnosis of UTI.  No treatment required.  Sallee Provencal 10/12/2013, 8:44 AM

## 2013-10-30 DIAGNOSIS — Z8669 Personal history of other diseases of the nervous system and sense organs: Secondary | ICD-10-CM | POA: Insufficient documentation

## 2013-10-30 DIAGNOSIS — I10 Essential (primary) hypertension: Secondary | ICD-10-CM | POA: Insufficient documentation

## 2013-11-10 MED ORDER — OFLOXACIN 0.3 % OP SOLN
0.3 % | Freq: Four times a day (QID) | OPHTHALMIC | Status: AC
Start: 2013-11-10 — End: 2013-11-20

## 2013-11-10 MED ORDER — ALBUTEROL SULFATE HFA 108 (90 BASE) MCG/ACT IN AERS
108 (90 Base) MCG/ACT | Freq: Four times a day (QID) | RESPIRATORY_TRACT | Status: DC | PRN
Start: 2013-11-10 — End: 2014-05-28

## 2013-11-10 MED ORDER — HYDROCODONE-ACETAMINOPHEN 10-325 MG PO TABS
10-325 MG | ORAL_TABLET | Freq: Three times a day (TID) | ORAL | Status: AC | PRN
Start: 2013-11-10 — End: 2013-12-10

## 2013-11-10 MED ORDER — AMOXICILLIN-POT CLAVULANATE 875-125 MG PO TABS
875-125 MG | ORAL_TABLET | Freq: Two times a day (BID) | ORAL | Status: AC
Start: 2013-11-10 — End: 2013-11-20

## 2013-11-10 NOTE — Progress Notes (Signed)
Subjective:      Patient ID: Lisa York is a 62 y.o. female.    Sinusitis  This is a new problem. The current episode started 1 to 4 weeks ago. There has been no fever. Her pain is at a severity of 7/10. The pain is moderate. Associated symptoms include congestion, coughing, ear pain, headaches, sinus pressure and sneezing. Pertinent negatives include no chills, hoarse voice, shortness of breath or sore throat. Treatments tried: otc antihistamine. The treatment provided mild relief.       Review of Systems   Constitutional: Negative for fever and chills.   HENT: Positive for ear pain, congestion, sneezing and sinus pressure. Negative for sore throat and hoarse voice.    Eyes: Negative.    Respiratory: Positive for cough. Negative for shortness of breath.    Cardiovascular: Negative.    Gastrointestinal: Negative.    Genitourinary: Negative.    Musculoskeletal: Negative.    Skin: Negative.    Neurological: Positive for headaches. Negative for dizziness.   Psychiatric/Behavioral: Negative.        Objective:   Physical Exam   Constitutional: She is oriented to person, place, and time. She appears well-developed and well-nourished.   HENT:   Head: Normocephalic and atraumatic.   Right Ear: Hearing, external ear and ear canal normal. A middle ear effusion (serous) is present.   Left Ear: Hearing, external ear and ear canal normal. Tympanic membrane is erythematous. Tympanic membrane is not bulging. A middle ear effusion (serous) is present.   Nose: Right sinus exhibits no maxillary sinus tenderness and no frontal sinus tenderness. Left sinus exhibits maxillary sinus tenderness and frontal sinus tenderness.   Mouth/Throat: Uvula is midline and mucous membranes are normal. Posterior oropharyngeal erythema present.   Eyes: Conjunctivae and EOM are normal. Pupils are equal, round, and reactive to light.   Neck: Normal range of motion. Neck supple.   Cardiovascular: Normal rate, regular rhythm and normal heart sounds.     Pulmonary/Chest: Effort normal. No accessory muscle usage. No respiratory distress. She has wheezes in the right upper field. She has no rhonchi. She has no rales.   Abdominal: Soft. Bowel sounds are normal.   Musculoskeletal: Normal range of motion.   Neurological: She is alert and oriented to person, place, and time.   Skin: Skin is warm and dry.   Psychiatric: She has a normal mood and affect.   Vitals reviewed.    BP 142/74   Pulse 101   Temp(Src) 98.5 ??F (36.9 ??C) (Tympanic)   Wt 185 lb (83.915 kg)   BMI 29.87 kg/m2   SpO2 95%    Assessment/plan:      Lisa York was seen today for sinusitis.    Diagnoses and associated orders for this visit:    Sinusitis acute: significant right frontal, right maxillary tenderness and overlying edema. Encouaged adequate hydration and Patient to f/u if no better or worsening of symptoms. Also see patient instructions.   - amoxicillin-clavulanate (AUGMENTIN) 875-125 MG per tablet; Take 1 tablet by mouth every 12 hours for 10 days.    Conjunctivitis of left eye:   - ofloxacin (OCUFLOX) 0.3 % ophthalmic solution; Place 3 drops into the left eye 4 times daily for 10 days.    Bronchitis, acute: encouraged smoking cessation. Discussed importance of adequate hydration and use of albuterol. Patient verbalized understanding.   - albuterol (PROAIR HFA) 108 (90 BASE) MCG/ACT inhaler; Inhale 2 puffs into the lungs every 6 hours as needed for Wheezing.  Left knee pain: Patient c/o persistent left knee. Patient previous saw Dr. Coolidge Breeze however insurance would not approve MRI and recommended PT. Referred patient for PT evaluate and treat. Instruction that if no better or worsening of symptoms for patient to f/u with Dr. Kathyrn Sheriff. Instructed patient to continue with [facility] and pain management as below. Patient to f/u if no better or worsening of symptoms.  - [facility]  - HYDROcodone-acetaminophen (NORCO) 10-325 MG per tablet; Take 1 tablet by mouth every 8 hours as  needed for Pain for up to 30 days.

## 2013-11-10 NOTE — Patient Instructions (Signed)
Drink plenty of fluids, take antibiotics as ordered and f/u if no better or worsening of symptoms.     Sinusitis: After Your Visit  Your Care Instructions     Sinusitis is an infection of the lining of the sinus cavities in your head. Sinusitis often follows a cold. It causes pain and pressure in your head and face.  In most cases, sinusitis gets better on its own in 1 to 2 weeks. But some mild symptoms may last for several weeks. Sometimes antibiotics are needed.  Follow-up care is a key part of your treatment and safety. Be sure to make and go to all appointments, and call your doctor if you are having problems. It's also a good idea to know your test results and keep a list of the medicines you take.  How can you care for yourself at home?  ?? Take an over-the-counter pain medicine, such as acetaminophen (Tylenol), ibuprofen (Advil, Motrin), or naproxen (Aleve). Read and follow all instructions on the label.  ?? If the doctor prescribed antibiotics, take them as directed. Do not stop taking them just because you feel better. You need to take the full course of antibiotics.  ?? Be careful when taking over-the-counter cold or flu medicines and Tylenol at the same time. Many of these medicines have acetaminophen, which is Tylenol. Read the labels to make sure that you are not taking more than the recommended dose. Too much acetaminophen (Tylenol) can be harmful.  ?? Breathe warm, moist air from a steamy shower, a hot bath, or a sink filled with hot water. Avoid cold, dry air. Using a humidifier in your home may help. Follow the directions for cleaning the machine.  ?? Use saline (saltwater) nasal washes to help keep your nasal passages open and wash out mucus and bacteria. You can buy saline nose drops at a grocery store or drugstore. Or you can make your own at home by adding 1 teaspoon of salt and 1 teaspoon of baking soda to 2 cups of distilled water. If you make your own, fill a bulb syringe with the solution, insert  the tip into your nostril, and squeeze gently. Blow your nose.  ?? Put a hot, wet towel or a warm gel pack on your face 3 or 4 times a day for 5 to 10 minutes each time.  ?? Try a decongestant nasal spray like oxymetazoline (Afrin). Do not use it for more than 3 days in a row. Using it for more than 3 days can make your congestion worse.  When should you call for help?  Call your doctor now or seek immediate medical care if:  ?? You have new or worse swelling or redness in your face or around your eyes.  ?? You have a new or higher fever.  Watch closely for changes in your health, and be sure to contact your doctor if:  ?? You have new or worse facial pain.  ?? The mucus from your nose becomes thicker (like pus) or has new blood in it.  ?? You are not getting better as expected.   Where can you learn more?   Go to [email or web address] and sign in to your MyChart account. Enter [phone] in the Search Health Information box to learn more about ???Sinusitis: After Your Visit.???    If you do not have an account, please click on the ???Sign Up Now??? link.     ?? 2006-2014 [employer]. Care instructions adapted under license by [facility]. This care instruction is for use with your licensed healthcare professional. If you have questions about a medical condition or this instruction, always ask your healthcare professional. Healthwise, Incorporated disclaims any warranty or liability for your use of this information.  Content Version: 10.1.311062; Current as of: March 08, 2013              Knee Pain: After Your Visit  Your Care Instructions     Overuse is often the cause of knee pain. Other causes are climbing stairs, kneeling, or other activities that use the knee. Everyday wear and tear, especially as you get older, also can cause knee pain.  Rest, along with home treatment, often relieves pain and allows your knee to heal. If you have a serious knee injury, you may need tests and  treatment.  Follow-up care is a key part of your treatment and safety. Be sure to make and go to all appointments, and call your doctor if you are having problems. It???s also a good idea to know your test results and keep a list of the medicines you take.  How can you care for yourself at home?  ?? Take pain medicines exactly as directed.  ?? If the doctor gave you a prescription medicine for pain, take it as prescribed.  ?? If you are not taking a prescription pain medicine, ask your doctor if you can take an over-the-counter medicine.  ?? Do not take two or more pain medicines at the same time unless the doctor told you to. Many pain medicines contain acetaminophen, which is Tylenol. Too much acetaminophen (Tylenol) can be harmful.  ?? Rest and protect your knee. Take a break from any activity that may cause pain.  ?? Put ice or a cold pack on your knee for 10 to 20 minutes at a time. Put a thin cloth between the ice and your skin.  ?? Prop up a sore knee on a pillow when you ice it or anytime you sit or lie down for the next 3 days. Try to keep it above the level of your heart. This will help reduce swelling.  ?? If your doctor recommends an elastic bandage, sleeve, or other type of support for your knee, wear it as directed.  ?? If your knee is not swollen, you can put moist heat, a heating pad, or a warm cloth on your knee.  ?? After several days of rest, you can begin gentle exercise of your knee.  ?? Reach and stay at a healthy weight. Extra weight can strain the joints, especially the knees and hips, and make the pain worse. Losing even a few pounds may help.  When should you call for help?  Call 911 anytime you think you may need emergency care. For example, call if:  ?? You have sudden chest pain and shortness of breath, or you cough up blood.  Call your doctor now or seek immediate medical care if:  ?? You have severe or increasing pain.  ?? Your leg or foot turns cold or changes color.  ?? You cannot stand or put weight  on your knee.  ?? Your knee looks twisted or bent out of shape.  ?? You cannot move your knee.  ?? You have signs of infection, such as:  ?? Increased pain, swelling, warmth, or redness.  ?? Red streaks leading from the sore area.  ?? Pus draining from a place on your knee.  ?? A fever.  ?? You have  signs of a blood clot in your leg, such as:  ?? Pain in your calf, back of the knee, thigh, or groin.  ?? Redness and swelling in your leg or groin.  Watch closely for changes in your health, and be sure to contact your doctor if:  ?? Your knee feels numb or tingly.  ?? You do not get better as expected.  ?? You have any new symptoms, such as swelling.  ?? You have bruises from a knee injury that last longer than 2 weeks.   Where can you learn more?   Go to [email or web address] and sign in to your MyChart account. Enter K195 in the Search Health Information box to learn more about ???Knee Pain: After Your Visit.???    If you do not have an account, please click on the ???Sign Up Now??? link.     ?? 2006-2014 Healthwise, Incorporated. Care instructions adapted under license by [facility]. This care instruction is for use with your licensed healthcare professional. If you have questions about a medical condition or this instruction, always ask your healthcare professional. Healthwise, Incorporated disclaims any warranty or liability for your use of this information.  Content Version: 10.1.311062; Current as of: February 16, 2012              acetaminophen and hydrocodone  Pronunciation: a SEET a MIN oh fen and hye droe KOE done  Brand: Anexsia, Co-Gesic, Hycet, Lorcet 10/650, Lorcet Plus, Lortab 10/500, Lortab 2.5/500, Lortab 5/500, Lortab 7.5/500, Lortab Elixir, Maxidone, [place], [place], Vicodin, [place], [place], [place], [place]  What is the most important information I should know about acetaminophen and hydrocodone?     You should not use this medicine if you have recently used alcohol, sedatives,  tranquilizers, or other narcotic medications.  Do not use this medicine if you have taken an MAO inhibitor in the past 14 days. A dangerous drug interaction could occur. MAO inhibitors include isocarboxazid, linezolid, phenelzine, rasagiline, selegiline, and tranylcypromine.     Do not take more of this medication than is recommended. An overdose of acetaminophen can damage your liver or cause death. Call your doctor at once if you have nausea, pain in your upper stomach, itching, loss of appetite, dark urine, clay-colored stools, or jaundice (yellowing of your skin or eyes).     In rare cases, acetaminophen may cause a severe skin reaction. Stop taking this medicine and call your doctor right away if you have skin redness or a rash that spreads and causes blistering and peeling.     This medicine may be habit forming. Never share acetaminophen and hydrocodone with another person, especially someone with a history of drug abuse or addiction.  What is acetaminophen and hydrocodone?  Hydrocodone is an opioid pain medication. An opioid is sometimes called a narcotic.  Acetaminophen is a less potent pain reliever that increases the effects of hydrocodone.  Acetaminophen and hydrocodone is a combination medicine used to relieve moderate to severe pain.  Acetaminophen and hydrocodone may also be used for purposes not listed in this medication guide.  What should I discuss with my healthcare provider before taking acetaminophen and hydrocodone?     You should not use this medicine if you are allergic to acetaminophen (Tylenol) or hydrocodone, or if you have recently used alcohol, sedatives, tranquilizers, or other narcotic medications.  Do not use this medicine if you have taken an MAO inhibitor in the past 14 days. A dangerous drug interaction could occur. MAO inhibitors include isocarboxazid,  linezolid, phenelzine, rasagiline, selegiline, and tranylcypromine.  To make sure this medicine is safe for you, tell your doctor  if you have:  ?? liver disease, cirrhosis, or if you drink more than 3 alcoholic beverages per day;  ?? a history of alcoholism or drug addiction;  ?? diarrhea, inflammatory bowel disease;  ?? bowel obstruction, severe constipation;  ?? a colostomy or ileostomy;  ?? kidney disease;  ?? low blood pressure, or if you are dehydrated;  ?? a history of head injury, brain tumor, or stroke; or  ?? asthma, COPD, sleep apnea, or other breathing disorders.    Hydrocodone may be habit forming. Never share this medicine with another person, especially someone with a history of drug abuse or addiction. Keep the medication in a place where others cannot get to it.     FDA pregnancy category C. It is not known whether this medication is harmful to an unborn baby, but it could cause breathing problems or addiction/withdrawal symptoms in a newborn. Tell your doctor if you are pregnant or plan to become pregnant while using this medication.     Acetaminophen and hydrocodone can pass into breast milk and may harm a nursing baby. You should not breast-feed while using this medicine.  How should I take acetaminophen and hydrocodone?  Follow all directions on your prescription label. Never take this medicine in larger amounts, or for longer than prescribed. An overdose can damage your liver or cause death. Tell your doctor if the medicine seems to stop working as well in relieving your pain.  Measure liquid medicine with a special dose-measuring spoon or medicine cup. If you do not have a dose-measuring device, ask your pharmacist for one.     Drink 6 to 8 full glasses of water daily to help prevent constipation while you are taking acetaminophen and hydrocodone. Do not use a stool softener (laxative) without first asking your doctor.  This medicine can cause unusual results with certain urine tests. Tell any doctor who treats you that you are using acetaminophen and hydrocodone.  If you need surgery, tell the surgeon ahead of time that you are  using this medicine. You may need to stop using the medicine for a short time.     Do not stop using this medicine suddenly after long-term use, or you could have unpleasant withdrawal symptoms. Ask your doctor how to avoid withdrawal symptoms when you stop using acetaminophen and hydrocodone.     Store at room temperature away from moisture and heat.  Keep track of the amount of medicine used from each new bottle. Hydrocodone is a drug of abuse and you should be aware if anyone is using your medicine improperly or without a prescription.  Always check your bottle to make sure you have received the correct pills (same brand and type) of medicine prescribed by your doctor. Ask the pharmacist if you have any questions about the medicine you receive at the pharmacy.  What happens if I miss a dose?  Since acetaminophen and hydrocodone is taken as needed, you may not be on a dosing schedule. If you are taking the medication regularly, take the missed dose as soon as you remember. Skip the missed dose if it is almost time for your next scheduled dose. Do not use extra medicine to make up the missed dose.  What happens if I overdose?     Seek emergency medical attention or call the Poison Help line at [phone]. An overdose of acetaminophen and hydrocodone  can be fatal.   The first signs of an acetaminophen overdose include loss of appetite, nausea, vomiting, stomach pain, sweating, and confusion or weakness. Later symptoms may include pain in your upper stomach, dark urine, and yellowing of your skin or the whites of your eyes.  Overdose symptoms may also include extreme drowsiness, pinpoint pupils, cold and clammy skin, muscle weakness, fainting, weak pulse, slow heart rate, coma, blue lips, shallow breathing, or no breathing  What should I avoid while taking acetaminophen and hydrocodone?     This medication may impair your thinking or reactions. Avoid driving or operating machinery until you know how acetaminophen  and hydrocodone will affect you.     Ask a doctor or pharmacist before using any other cold, allergy, pain, or sleep medication. Acetaminophen (sometimes abbreviated as APAP) is contained in many combination medicines. Taking certain products together can cause you to get too much acetaminophen which can lead to a fatal overdose. Check the label to see if a medicine contains acetaminophen or APAP.     Avoid drinking alcohol. It may increase your risk of liver damage while taking acetaminophen.  What are the possible side effects of acetaminophen and hydrocodone?     Get emergency medical help if you have any of these signs of an allergic reaction: hives; difficulty breathing; swelling of your face, lips, tongue, or throat.     In rare cases, acetaminophen may cause a severe skin reaction that can be fatal. This could occur even if you have taken acetaminophen in the past and had no reaction. Stop taking this medicine and call your doctor right away if you have skin redness or a rash that spreads and causes blistering and peeling.  If you have this type of reaction, you should never again take any medicine that contains acetaminophen.     Call your doctor at once if you have:  ?? shallow breathing, slow heartbeat;  ?? a light-headed feeling, like you might pass out;  ?? confusion, unusual thoughts or behavior;  ?? seizure (convulsions);  ?? easy bruising or bleeding; or  ?? nausea, upper stomach pain, itching, loss of appetite, dark urine, clay-colored stools, jaundice (yellowing of the skin or eyes).  Common side effects include:  ?? drowsiness;  ?? upset stomach, constipation;  ?? headache;  ?? blurred vision; or  ?? dry mouth.  This is not a complete list of side effects and others may occur. Call your doctor for medical advice about side effects. You may report side effects to FDA at 1-800-FDA-1088.  What other drugs will affect acetaminophen and hydrocodone?     Taking this medicine with other drugs that make you sleepy or  slow your breathing can cause dangerous or life-threatening side effects. Ask your doctor before taking acetaminophen and hydrocodone with a sleeping pill, narcotic pain medicine, muscle relaxer, or medicine for anxiety, depression, or seizures.  Other drugs may interact with acetaminophen and hydrocodone, including prescription and over-the-counter medicines, vitamins, and herbal products. Tell each of your health care providers about all medicines you use now and any medicine you start or stop using.  Where can I get more information?  Your pharmacist can provide more information about acetaminophen and hydrocodone.    Remember, keep this and all other medicines out of the reach of children, never share your medicines with others, and use this medication only for the indication prescribed.   Every effort has been made to ensure that the information provided by [employer]  [employer]') is accurate, up-to-date, and complete, but no guarantee is made to that effect. Drug information contained herein may be time sensitive. [employer] information has been compiled for use by healthcare practitioners and consumers in the [place] and therefore [employer] does not warrant that uses outside of the [place] are appropriate, unless specifically indicated otherwise. [employer]'s drug information does not endorse drugs, diagnose patients or recommend therapy. [employer]'s drug information is an [employer], corporate to assist licensed healthcare practitioners in caring for their patients and/or to serve consumers viewing this service as a supplement to, and not a substitute for, the expertise, skill, knowledge and judgment of healthcare practitioners. The absence of a warning for a given drug or drug combination in no way should be construed to indicate that the drug or drug combination is safe, effective or appropriate for any given patient. [employer] does not assume any responsibility for any aspect of healthcare  administered with the aid of information [employer] provides. The information contained herein is not intended to cover all possible uses, directions, precautions, warnings, drug interactions, allergic reactions, or adverse effects. If you have questions about the drugs you are taking, check with your doctor, nurse or pharmacist.  Copyright [phone] [employer] Version: 14.01. Revision date: 08/04/2012.  This information does not replace the advice of a doctor. Healthwise, Incorporated disclaims any warranty or liability for your use of this information.   Content Version: 10.1.311062

## 2013-12-18 MED ORDER — TOBRAMYCIN-DEXAMETHASONE 0.3-0.1 % OP SUSP
OPHTHALMIC | Status: AC
Start: 2013-12-18 — End: 2013-12-28

## 2013-12-18 NOTE — Telephone Encounter (Signed)
Pt was Dx with pink eye in ER, needs refill on eye drops

## 2014-01-08 MED ORDER — TEMAZEPAM 15 MG PO CAPS
15 MG | ORAL_CAPSULE | Freq: Every evening | ORAL | Status: DC
Start: 2014-01-08 — End: 2014-02-13

## 2014-01-08 MED ORDER — HYDROCODONE-ACETAMINOPHEN 10-325 MG PO TABS
10-325 MG | ORAL_TABLET | Freq: Four times a day (QID) | ORAL | Status: DC | PRN
Start: 2014-01-08 — End: 2014-02-26

## 2014-01-11 NOTE — Telephone Encounter (Signed)
Error

## 2014-02-13 MED ORDER — TEMAZEPAM 15 MG PO CAPS
15 MG | ORAL_CAPSULE | ORAL | Status: DC
Start: 2014-02-13 — End: 2014-03-28

## 2014-02-26 MED ORDER — HYDROCODONE-ACETAMINOPHEN 10-325 MG PO TABS
10-325 MG | ORAL_TABLET | Freq: Four times a day (QID) | ORAL | Status: DC | PRN
Start: 2014-02-26 — End: 2014-03-28

## 2014-03-12 MED ORDER — IBUPROFEN 600 MG PO TABS
600 MG | ORAL_TABLET | Freq: Three times a day (TID) | ORAL | Status: DC | PRN
Start: 2014-03-12 — End: 2014-05-28

## 2014-03-12 NOTE — Progress Notes (Signed)
Chief Complaint:  Knee Pain      History of Present Illness:  Lisa FordyceLinda S York is a 63 y.o. female here regarding left knee pain. The pain is located lateral and has been present for the last 6 months or so.  She states she notices some catching with walking and the knee sometimes gives out. She denies any specific injury.  She states that ice does tend to help.  She was given Mobic and Norco from her PCP, however she was unable to tolerate the Mobic, but the Norco does help.  She has been taking ibuprofen 600 mg twice daily and thinks it only helps somewhat.   She underwent a knee scope of this knee in 2007 by Dr. Kathyrn SheriffStanfield and has had no complaints until approx 6mons ago.  At her last visit we referred her for an MRI however that was apparently denied.    Medical History:  Patient's medications, allergies, past medical, surgical, social and family histories were reviewed and updated as appropriate.    Review of Systems:  Pertinent items are noted in HPI.  Relevant review of systems reviewed and available in the patient's chart    Vital Signs:  Filed Vitals:    03/12/14 0900   BP: 138/79   Pulse: 80       Pain Assessment:  Location of Pain: Knee  Location Modifiers: Left  Severity of Pain: 8  Quality of Pain: Dull, Aching, Throbbing  Duration of Pain: Persistent  Frequency of Pain: Constant  Aggravating Factors: Stairs, Walking, Standing  Limiting Behavior: Some  Relieving Factors: Rest  Result of Injury: No  Work-Related Injury: No  Are there other pain locations you wish to document?: No    General Exam:   Constitutional: Patient is adequately groomed with no evidence of malnutrition  Lymphatic: The lymphatic examination bilaterally reveals all areas to be without enlargement or induration.  Vascular: Examination reveals no swelling or calf tenderness.  Peripheral pulses are palpable and 2+.    Knee Examination  Inspection:   No gross deformities noted. no swelling noted. No erythema or ecchymosis. Skin is  intact.    Palpation:  Tenderness to palpation along the lateral joint line.  Tenderness palpation over the patella    Range of Motion:  Range of motion is essentially full with flexion and extension    Strength:  5 out of 5 strength with flexion and extension.  Able to do a straight leg raise     Special Tests:  ACL, MCL, PCL, LCL are intact with stress exam.  There is crepitus noted with range of motion under the patella.  A positive grind test.  No pain or catching is able to be elicited with McMurray's.    Skin: There are no rashes, ulcerations or lesions.    Gait: Normal    Reflex: Not tested    Additional Examinations:  Right Lower Extremity: Examination of the right lower extremity does not show any tenderness, deformity or injury.  Range of motion is unremarkable.  There is no gross instability.  There are no rashes, ulcerations or lesions.  Strength and tone are normal.      None  Diagnostic Test Findings:  No new x-rays taken today      Assessment : left knee pain possible meniscus pathology versus arthritic changes.    Impression:  Encounter Diagnosis   Name Primary?   ??? Left knee pain Yes       Office Procedures:  No orders  of the defined types were placed in this encounter.       Treatment Plan:  At this point the patient would like to proceed with a corticosteroid injection.  We will also give her a prescription for ibuprofen 600 mg to take 3 times a day.  She will follow-up with Lisa York in approximately 2 months if she is continuing to have pain we may want to refer her for an MRI once again.  The risks and benefits of an injection were discussed with the patient.  The patient had full opportunity to ask questions and all were answered.  The patient then provided verbal informed consent.  The skin was then prepped with betadine solution and alcohol.  Under aseptic conditions, the  left knee was injected with 2cc of 1% xylocaine, then a mixture of 3cc of 0.5% marcaine and 2cc (80 mg) of Depomedrol.  There  were no immediate complications following the injection.  The patient was advised of the possibility of injection site reaction and instructed to apply ice to the area.    Eustaquio Boyden, PA-C, scribing for Dr. Warnell Forester

## 2014-03-12 NOTE — Progress Notes (Signed)
SITE: LEFT KNEE    MARCAINE  NDC# 0409-1610-50  LOT NUMBER#  [id number]    DEPO 40MG  NDC# 0009-0280-03  LOT NUMBER#  [id number]    LIDOCAINE    NDC# 0409-4276-02  LOT NUMBER#  [id number]

## 2014-03-28 MED ORDER — PAROXETINE HCL 40 MG PO TABS
40 MG | ORAL_TABLET | Freq: Every day | ORAL | Status: DC
Start: 2014-03-28 — End: 2014-10-16

## 2014-03-28 MED ORDER — TEMAZEPAM 15 MG PO CAPS
15 MG | ORAL_CAPSULE | Freq: Every evening | ORAL | Status: DC | PRN
Start: 2014-03-28 — End: 2014-07-24

## 2014-03-28 MED ORDER — HYDROCODONE-ACETAMINOPHEN 10-325 MG PO TABS
10-325 MG | ORAL_TABLET | Freq: Four times a day (QID) | ORAL | Status: DC | PRN
Start: 2014-03-28 — End: 2014-04-30

## 2014-03-28 MED ORDER — OMEPRAZOLE 40 MG PO CPDR
40 MG | ORAL_CAPSULE | Freq: Every day | ORAL | Status: DC
Start: 2014-03-28 — End: 2014-10-16

## 2014-03-28 NOTE — Patient Instructions (Addendum)
Make nurse visit for blood work, f/u in 90 days or less.     Stopping Smoking: After Your Visit  Your Care Instructions  Cigarette smokers crave the nicotine in cigarettes. Giving it up is much harder than simply changing a habit. Your body has to stop craving the nicotine. It is hard to quit, but you can do it. There are many tools that people use to quit smoking. You may find that combining tools works best for you.  There are several steps to quitting. First you get ready to quit. Then you get support to help you. After that, you learn new skills and behaviors to become a nonsmoker. For many people, a necessary step is getting and using medicine.  Your doctor will help you set up the plan that best meets your needs. You may want to attend a smoking cessation program to help you quit smoking. When you choose a program, look for one that has proven success. Ask your doctor for ideas. You will greatly increase your chances of success if you take medicine as well as get counseling or join a cessation program.  Some of the changes you feel when you first quit tobacco are uncomfortable. Your body will miss the nicotine at first, and you may feel short-tempered and grumpy. You may have trouble sleeping or concentrating. Medicine can help you deal with these symptoms. You may struggle with changing your smoking habits and rituals. The last step is the tricky one: Be prepared for the smoking urge to continue for a time. This is a lot to deal with, but keep at it. You will feel better.  Follow-up care is a key part of your treatment and safety. Be sure to make and go to all appointments, and call your doctor if you are having problems. It???s also a good idea to know your test results and keep a list of the medicines you take.  How can you care for yourself at home?  ?? Ask your family, friends, and coworkers for support. You have a better chance of quitting if you have help and support.  ?? Join a support group, such as  Nicotine Anonymous, for people who are trying to quit smoking.  ?? Consider signing up for a smoking cessation program, such as the American [employer] Association's Freedom from Smoking program.  ?? Set a quit date. Pick your date carefully so that it is not right in the middle of a big deadline or stressful time. Once you quit, do not even take a puff. Get rid of all ashtrays and lighters after your last cigarette. Clean your house and your clothes so that they do not smell of smoke.  ?? Learn how to be a nonsmoker. Think about ways you can avoid those things that make you reach for a cigarette.  ?? Avoid situations that put you at greatest risk for smoking. For some people, it is hard to have a drink with friends without smoking. For others, they might skip a coffee break with coworkers who smoke.  ?? Change your daily routine. Take a different route to work or eat a meal in a different place.  ?? Cut down on stress. Calm yourself or release tension by doing an activity you enjoy, such as reading a book, taking a hot bath, or gardening.  ?? Talk to your doctor or pharmacist about nicotine replacement therapy, which replaces the nicotine in your body. You still get nicotine but you do not use tobacco. Nicotine replacement  products help you slowly reduce the amount of nicotine you need. These products come in several forms, many of them available over-the-counter:  ?? Nicotine patches  ?? Nicotine gum and lozenges  ?? Nicotine inhaler  ?? Ask your doctor about bupropion (Wellbutrin) or varenicline (Chantix), which are prescription medicines. They do not contain nicotine. They help you by reducing withdrawal symptoms, such as stress and anxiety.  ?? Some people find hypnosis, acupuncture, and massage helpful for ending the smoking habit.  ?? Eat a healthy diet and get regular exercise. Having healthy habits will help your body move past its craving for nicotine.  ?? Be prepared to keep trying. Most people are not successful the first  few times they try to quit. Do not get mad at yourself if you smoke again. Make a list of things you learned and think about when you want to try again, such as next week, next month, or next year.   Where can you learn more?   Go to [email or web address] and sign in to your MyChart account. Enter [phone] in the [place] box to learn more about ???Stopping Smoking: After Your Visit.???    If you do not have an account, please click on the ???Sign Up Now??? link.     ?? 2006-2015 [employer], Inc[employer]. Care instructions adapted under license by [facility]. This care instruction is for use with your licensed healthcare professional. If you have questions about a medical condition or this instruction, always ask your healthcare professional. [employer] any warranty or liability for your use of this information.  Content Version: 10.4.390249; Current as of: September 05, 2013

## 2014-03-28 NOTE — Progress Notes (Signed)
Subjective:      Patient ID: Lisa York is a 63 y.o. female.  Medication Refill  Pt presents for medication refills.    Knee Pain   There was no injury mechanism. The pain is present in the left knee. The quality of the pain is described as aching. The pain is at a severity of 8/10. Pertinent negatives include no muscle weakness. The symptoms are aggravated by movement and weight bearing.       Review of Systems   Constitutional: Negative for fever, chills, appetite change and fatigue.   HENT: Negative.    Respiratory: Negative.  Negative for cough and shortness of breath.    Cardiovascular: Negative.  Negative for chest pain and palpitations.   Gastrointestinal: Negative.    Genitourinary: Negative.    Musculoskeletal: Positive for arthralgias (right knee).   Skin: Negative.    Neurological: Negative.    Hematological: Negative.    Psychiatric/Behavioral: Positive for sleep disturbance (insomnia). The patient is nervous/anxious (ongoing).        Objective:   Physical Exam   Constitutional: She is oriented to person, place, and time. She appears well-developed and well-nourished.   HENT:   Head: Normocephalic and atraumatic.   Eyes: Conjunctivae and EOM are normal. Pupils are equal, round, and reactive to light.   Neck: Normal range of motion. Neck supple.   Cardiovascular: Normal rate, regular rhythm and normal heart sounds.    Pulmonary/Chest: Effort normal and breath sounds normal.   Abdominal: Soft. Bowel sounds are normal.   Musculoskeletal: Normal range of motion.        Right knee: She exhibits no swelling and no erythema. Tenderness found.   Neurological: She is alert and oriented to person, place, and time.   Skin: Skin is warm and dry.   Psychiatric: She has a normal mood and affect.   Vitals reviewed.    BP 142/84    Pulse 88    Temp(Src) 98.4 ??F (36.9 ??C) (Tympanic)    Wt 180 lb (81.647 kg)    SpO2 98%     Assessment/Plan:      Lisa York was seen today for medication refill and knee pain.    Diagnoses and  associated orders for this visit:    Anxiety: ongoing.  - PARoxetine (PAXIL) 40 MG tablet; Take 1 tablet by mouth daily.  - COMPREHENSIVE METABOLIC PANEL; Future    Knee pain: Patient had cortisone injection if left knee last month (Dr. Kathyrn SheriffStanfield).   - HYDROcodone-acetaminophen (NORCO) 10-325 MG per tablet; Take 1 tablet by mouth every 6 hours as needed for Pain.   Controlled Substances Monitoring: Attestation: The Prescription Monitoring Report for this patient was reviewed today. Louann Sjogren(Zhanae Proffit D Jocelyne Reinertsen, CNP)  Documentation: No signs of potential drug abuse or diversion identified. (previous signed medication contract and patient instructed she must b/u every 90 for f/u visit/OARRS report. ) Louann Sjogren(Ziona Wickens D Karenna Romanoff, CNP)    GERD (gastroesophageal reflux disease): symptoms controlled. Refill.  - omeprazole (PRILOSEC) 40 MG capsule; Take 1 capsule by mouth daily.    Insomnia: ongoing insomnia. Refill as below.   - temazepam (RESTORIL) 15 MG capsule; Take 1 capsule by mouth nightly as needed for Sleep.    Breast cancer screening, high risk patient: past due, discussed the importance.   - MAM Digital Screen Bilateral; Future    Lipid screening:  - Lipid Panel; Future

## 2014-03-29 LAB — COMPREHENSIVE METABOLIC PANEL
ALT: 14 U/L (ref 10–40)
AST: 13 U/L — ABNORMAL LOW (ref 15–37)
Albumin/Globulin Ratio: 1.9 (ref 1.1–2.2)
Albumin: 4.6 g/dL (ref 3.4–5.0)
Alkaline Phosphatase: 70 U/L (ref 40–129)
Anion Gap: 13 (ref 3–16)
BUN: 14 mg/dL (ref 7–20)
CO2: 28 mmol/L (ref 21–32)
Calcium: 9.7 mg/dL (ref 8.3–10.6)
Chloride: 98 mmol/L — ABNORMAL LOW (ref 99–110)
Creatinine: 0.6 mg/dL (ref 0.6–1.2)
GFR African American: >60 (ref >60)
GFR Non-African American: >60 (ref >60)
Globulin: 2.4 g/dL
Glucose: 91 mg/dL (ref 70–99)
Potassium: 5.1 mmol/L (ref 3.5–5.1)
Sodium: 139 mmol/L (ref 136–145)
Total Bilirubin: 0.4 mg/dL (ref 0.0–1.0)
Total Protein: 7 g/dL (ref 6.4–8.2)

## 2014-03-29 LAB — LIPID PANEL
Cholesterol, Total: 254 mg/dL — ABNORMAL HIGH (ref 0–199)
HDL: 51 mg/dL (ref 40–60)
LDL Calculated: 171 mg/dL — ABNORMAL HIGH (ref <100)
Triglycerides: 160 mg/dL — ABNORMAL HIGH (ref 0–150)
VLDL Cholesterol Calculated: 32 mg/dL

## 2014-03-29 NOTE — Progress Notes (Signed)
Blood drawn per order.  Needle size: 21 g  Site: R Antecubital.  First attempt successful Yes    Second attempt no    Pressure applied until bleeding stopped.  Yes applied.    Patient informed to call office or return if bleeding reoccurs and unable to stop.    Tubes drawn: 0 purple     1 red

## 2014-04-30 ENCOUNTER — Encounter

## 2014-04-30 MED ORDER — HYDROCODONE-ACETAMINOPHEN 10-325 MG PO TABS
10-325 MG | ORAL_TABLET | Freq: Four times a day (QID) | ORAL | Status: DC | PRN
Start: 2014-04-30 — End: 2014-05-25

## 2014-05-25 MED ORDER — HYDROCODONE-ACETAMINOPHEN 10-325 MG PO TABS
10-325 MG | ORAL_TABLET | Freq: Four times a day (QID) | ORAL | Status: DC | PRN
Start: 2014-05-25 — End: 2014-06-25

## 2014-05-25 NOTE — Telephone Encounter (Signed)
Pt states that she will pick up next week

## 2014-05-28 MED ORDER — IBUPROFEN 600 MG PO TABS
600 MG | ORAL_TABLET | Freq: Three times a day (TID) | ORAL | Status: DC | PRN
Start: 2014-05-28 — End: 2014-09-24

## 2014-05-28 MED ORDER — ALBUTEROL SULFATE HFA 108 (90 BASE) MCG/ACT IN AERS
108 (90 Base) MCG/ACT | Freq: Four times a day (QID) | RESPIRATORY_TRACT | Status: DC | PRN
Start: 2014-05-28 — End: 2015-07-15

## 2014-05-28 NOTE — Progress Notes (Signed)
Subjective:      Patient ID: Lisa York is a 63 y.o. female.    Abdominal Pain  This is a new problem. The current episode started yesterday (Pt states that last week she helped a friend set tobacco and then helped another friend put up a swimming pool, she didn't notice any pain at the time, but yesterday she started feeling the pain). The onset quality is sudden. The problem occurs intermittently. The pain is located in the RUQ. The pain is at a severity of 8/10. The pain is moderate. The quality of the pain is tearing and burning. The abdominal pain does not radiate. Associated symptoms include constipation (chronic - IBS) and diarrhea (chronic alternating with constipation). Pertinent negatives include no fever, melena, nausea or vomiting. The pain is aggravated by movement and certain positions.   Patient denies N/V, indigestion, changes in bowels or bladder or dark/bloody stools. Patient does have history of IBS with intermittent diarrhea and constipation which has not changed for years. Last colonoscopy was 11-30-11.     Review of Systems   Constitutional: Negative for fever, chills and activity change.   HENT: Negative.    Eyes: Negative.    Respiratory: Negative.    Cardiovascular: Negative.    Gastrointestinal: Positive for abdominal pain, diarrhea (chronic alternating with constipation) and constipation (chronic - IBS). Negative for nausea, vomiting and melena.   Endocrine: Negative.    Genitourinary: Negative.    Musculoskeletal: Negative.    Skin: Negative.    Neurological: Negative.    Psychiatric/Behavioral: Negative.        Objective:   Physical Exam   Constitutional: She is oriented to person, place, and time. She appears well-developed and well-nourished.   HENT:   Head: Normocephalic and atraumatic.   Eyes: Conjunctivae and EOM are normal. Pupils are equal, round, and reactive to light.   Neck: Normal range of motion. Neck supple.   Cardiovascular: Normal rate, regular rhythm and normal heart  sounds.    Pulmonary/Chest: Effort normal and breath sounds normal. No accessory muscle usage. No respiratory distress.   Abdominal: Soft. Bowel sounds are normal.       Musculoskeletal: Normal range of motion.   Neurological: She is alert and oriented to person, place, and time.   Skin: Skin is warm and dry.   Psychiatric: She has a normal mood and affect.   Vitals reviewed.    BP 140/70    Pulse 105    Temp(Src) 98.8 ??F (37.1 ??C) (Tympanic)    Wt 187 lb (84.823 kg)    SpO2 97%     Assessment/Plan:      Lisa York was seen today for abdominal pain.    Diagnoses and associated orders for this visit:    Abdominal pain: ? separtation of rectus abdominus muscle versus herniation. Tenderness on palpation and increased discomfort with increased physical activity. Order for abd. [place] as below. Discussed with patient many types of hernia's can be repaired if they are causing symptoms. Discussed the worse case scenario being hernia with strangulation of tissue. Instructed patient to go straight to the ED should she experience severe or worsening of symptoms - budging of are with increase pain. Patient verbalized understanding.    - US Abdomen Complete; Future    Hernia of abdominal wall: see note above.     Bronchitis, acute: refill  - albuterol (PROAIR HFA) 108 (90 BASE) MCG/ACT inhaler; Inhale 2 puffs into the lungs every 6 hours as needed for Wheezing  Other Orders: refill  - ibuprofen (ADVIL;MOTRIN) 600 MG tablet; Take 1 tablet by mouth every 8 hours as needed for Pain

## 2014-05-28 NOTE — Patient Instructions (Signed)
Hernia: After Your Visit  Your Care Instructions     A hernia develops when tissue bulges through a weak spot in the wall of your belly. The groin area and the navel are common areas for a hernia. A hernia can also develop near the area of a surgery you had before.  Pressure from lifting, straining, or coughing can tear the weak area, causing the hernia to bulge and be painful.  If you cannot push a hernia back into place, the tissue may become trapped outside the belly wall. If the hernia gets twisted and loses its blood supply, it will swell and die. This is called a strangulated hernia. It usually causes a lot of pain. It needs treatment right away.  Some hernias need to be repaired to prevent a strangulated hernia. If your hernia causes symptoms or is large, you may need surgery.  Follow-up care is a key part of your treatment and safety. Be sure to make and go to all appointments, and call your doctor if you are having problems. It???s also a good idea to know your test results and keep a list of the medicines you take.  How can you care for yourself at home?  ?? Take care when lifting heavy objects.  ?? Stay at a healthy weight.  ?? Do not smoke. Smoking can cause coughing, which can cause your hernia to bulge. If you need help quitting, talk to your doctor about stop-smoking programs and medicines. These can increase your chances of quitting for good.  ?? Talk with your doctor before wearing a corset or truss for a hernia. These devices are not recommended for treating hernias and sometimes can do more harm than good. There may be certain situations when your doctor thinks a truss would work, but these are rare.  When should you call for help?  Call your doctor now or seek immediate medical care if:  ?? You have severe belly pain.  ?? You have nausea or vomiting.  ?? You have belly pain and are not passing gas or stool.  ?? You cannot push the hernia back into place with gentle pressure when you are lying  down.  ?? The skin over the hernia turns red or becomes tender.  Watch closely for changes in your health, and be sure to contact your doctor if:  ?? You have new or increased pain.  ?? You do not get better as expected.   Where can you learn more?   Go to [email or web address] and sign in to your MyChart account. Enter C129 in the Search Health Information box to learn more about ???Hernia: After Your Visit.???    If you do not have an account, please click on the ???Sign Up Now??? link.     ?? 2006-2015 Healthwise, Incorporated. Care instructions adapted under license by [facility]. This care instruction is for use with your licensed healthcare professional. If you have questions about a medical condition or this instruction, always ask your healthcare professional. Healthwise, Incorporated disclaims any warranty or liability for your use of this information.  Content Version: 10.4.390249; Current as of: November 10, 2013

## 2014-06-06 NOTE — Patient Instructions (Signed)
Hernia: After Your Visit  Your Care Instructions     A hernia develops when tissue bulges through a weak spot in the wall of your belly. The groin area and the navel are common areas for a hernia. A hernia can also develop near the area of a surgery you had before.  Pressure from lifting, straining, or coughing can tear the weak area, causing the hernia to bulge and be painful.  If you cannot push a hernia back into place, the tissue may become trapped outside the belly wall. If the hernia gets twisted and loses its blood supply, it will swell and die. This is called a strangulated hernia. It usually causes a lot of pain. It needs treatment right away.  Some hernias need to be repaired to prevent a strangulated hernia. If your hernia causes symptoms or is large, you may need surgery.  Follow-up care is a key part of your treatment and safety. Be sure to make and go to all appointments, and call your doctor if you are having problems. It???s also a good idea to know your test results and keep a list of the medicines you take.  How can you care for yourself at home?  ?? Take care when lifting heavy objects.  ?? Stay at a healthy weight.  ?? Do not smoke. Smoking can cause coughing, which can cause your hernia to bulge. If you need help quitting, talk to your doctor about stop-smoking programs and medicines. These can increase your chances of quitting for good.  ?? Talk with your doctor before wearing a corset or truss for a hernia. These devices are not recommended for treating hernias and sometimes can do more harm than good. There may be certain situations when your doctor thinks a truss would work, but these are rare.  When should you call for help?  Call your doctor now or seek immediate medical care if:  ?? You have severe belly pain.  ?? You have nausea or vomiting.  ?? You have belly pain and are not passing gas or stool.  ?? You cannot push the hernia back into place with gentle pressure when you are lying  down.  ?? The skin over the hernia turns red or becomes tender.  Watch closely for changes in your health, and be sure to contact your doctor if:  ?? You have new or increased pain.  ?? You do not get better as expected.   Where can you learn more?   Go to [email or web address] and sign in to your MyChart account. Enter C129 in the Search Health Information box to learn more about ???Hernia: After Your Visit.???    If you do not have an account, please click on the ???Sign Up Now??? link.     ?? 2006-2015 Healthwise, Incorporated. Care instructions adapted under license by [facility]. This care instruction is for use with your licensed healthcare professional. If you have questions about a medical condition or this instruction, always ask your healthcare professional. Health[employer], Incorporated disclaims any warranty or liability for your use of this information.  Content Version: 10.4.390249; Current as of: November 10, 2013

## 2014-06-06 NOTE — Progress Notes (Signed)
Subjective:      Patient ID: Lisa York is a 63 y.o. female.    Abdominal Pain  This is a recurrent problem. The current episode started 1 to 4 weeks ago. The onset quality is gradual. The problem occurs intermittently. The problem has been gradually worsening. The pain is located in the epigastric region and periumbilical region. The pain is at a severity of 8/10. The quality of the pain is sharp and aching. The abdominal pain does not radiate. Associated symptoms include nausea. Pertinent negatives include no constipation, diarrhea, dysuria, fever, frequency, headaches or vomiting. The pain is aggravated by certain positions (lifting, bending). Relieved by: avoid lifting and resting. Lisa York has tried proton pump inhibitors for the symptoms. The treatment provided no relief. Prior diagnostic workup includes ultrasound. Abdominal surgery: previous hernia repair, appendectomy.       Review of Systems   Constitutional: Negative for fever, chills and appetite change.   HENT: Negative.    Respiratory: Negative for cough, shortness of breath and wheezing.    Cardiovascular: Negative for chest pain and palpitations.   Gastrointestinal: Positive for nausea, abdominal pain and abdominal distention. Negative for vomiting, diarrhea, constipation, blood in stool, anal bleeding and rectal pain.   Endocrine: Negative.    Genitourinary: Negative for dysuria and frequency.   Skin: Negative.    Neurological: Negative for dizziness and headaches.   Psychiatric/Behavioral: Negative for sleep disturbance.       Objective:   Physical Exam   Constitutional: Lisa York is oriented to person, place, and time. Lisa York appears well-developed and well-nourished.   HENT:   Head: Normocephalic and atraumatic.   Eyes: Conjunctivae and EOM are normal. Pupils are equal, round, and reactive to light.   Neck: Normal range of motion. Neck supple.   Cardiovascular: Normal rate, regular rhythm and normal heart sounds.    Pulmonary/Chest: Effort normal and breath  sounds normal.   Abdominal: Soft. Bowel sounds are normal. There is no hepatosplenomegaly. There is tenderness. There is no CVA tenderness.       Musculoskeletal: Normal range of motion.   Neurological: Lisa York is alert and oriented to person, place, and time.   Skin: Skin is warm and dry.   Psychiatric: Lisa York has a normal mood and affect.   Vitals reviewed.    BP 122/82    Pulse 95    Temp(Src) 98.4 ??F (36.9 ??C) (Tympanic)    Wt 182 lb (82.555 kg)    SpO2 98%     Assessment:      Lisa York was seen today for abdominal pain.    Diagnoses and associated orders for this visit:    Abdominal pain:? separtation of rectus abdominus muscle versus herniation. Tenderness on palpation and increased discomfort with increased physical activity- particularly lifting. Order for abd. [place] as below. Instructed patient to go straight to the ED should Lisa York experience severe or worsening of symptoms - budging of are with increase pain. Patient verbalized understanding.    [facility]- Consuelo Pandy, MD

## 2014-06-09 NOTE — Telephone Encounter (Signed)
Tell her HIDA showed GB is the cause of her problems.  Recommendation for lap chole.  If she wants to schedule, form is in folder.  If she wants to discuss further, then [facility].

## 2014-06-11 NOTE — Telephone Encounter (Signed)
Spoke with pt. Scheduled her for lap chole @ [facility] on 06-18-14

## 2014-06-12 NOTE — Progress Notes (Signed)
Attempted to contact patient.  No answer.  Message left at 1:58 PM on 06/12/2014.

## 2014-06-13 NOTE — Patient Instructions (Signed)
PRE OP INSTRUCTION SHEET   1. Do not eat or drink anything after 12 midnight  prior to surgery. This includes no water, chewing gum or mints.   2. Take the following pills will a small sip of water (see MAR)                                        3. Aspirin, Ibuprofen, Advil, Naproxen, Vitamin E, fish oil and other Anti-inflammatory products should be stopped for 5 days before surgery or as directed by your physician.   4. Check with your Doctor regarding stopping Plavix, Coumadin, Lovenox, Fragmin or other blood thinners   5. Do not smoke, and do not drink any alcoholic beverages 24 hours prior to surgery.  This includes NA Beer.   6. You may brush your teeth and gargle the morning of surgery.  DO NOT SWALLOW WATER   7. You MUST make arrangements for a responsible adult to take you home after your surgery. You will not be allowed to leave alone or drive yourself home.  It is strongly suggested someone stay with you the first 24 hrs. Your surgery will be cancelled if you do not have a ride home.   8. A parent/legal guardian must accompany a child scheduled for surgery and plan to stay at the hospital until the child is discharged.  Please do not bring other children with you.   9. Please wear simple, loose fitting clothing to the hospital.  Do not bring valuables (money, credit cards, checkbooks, etc.) Do not wear any makeup (including no eye makeup) or nail polish on your fingers or toes.   10. DO NOT wear any jewelry or piercings on day of surgery.  All body piercing jewelry must be removed.   11. If you have dentures,glasses, or contacts they will be removed before going to the OR; we will provide you a container.    12. Please see your family doctor/and cardiologist for a history & physical and/or concerning medications.  Bring any test results/reports from your physician's office. Have history and labs faxed to [phone]    13. Remember to bring Blood Bank bracelet on the day of  surgery.   14. If you have a Living Will and Durable Power of Attorney for Healthcare, please bring in a copy.   15. Notify your Surgeon if you develop any illness between now and surgery  time, cough, cold, fever, sore throat, nausea, vomiting, etc.  Please notify your surgeon if you experience dizziness, shortness of breath or blurred vision between now & the time of your surgery   16. DO NOT shave your operative site 96 hours prior to surgery. For face & neck surgery, men may use an electric razor 48 hours prior to surgery.   17. Shower with _x__Antibacterial soap (x_chlorhexidine for total joint  Pt's) shower two times before surgery.(the morning of and the night before.   18. To provide excellent care visitors will be limited to one in the room at any given time.  Please call pre admission testing if you any further questions [phone] or [phone]

## 2014-06-13 NOTE — Progress Notes (Signed)
Obstructive Sleep Apnea (OSA) Screening     Patient:  Lisa York, Lisa York    Date of Birth: 1951/10/04      Medical Record #:  [id number]                     Date:  06/13/2014     1.  Are you a loud and/or regular snorer?     []   Yes       [x]  No    2.  Have you been observed to gasp or stop breathing during sleep?     []   Yes       [x]  No    3.  Do you feel tired or groggy upon awakening or do you awaken with a headache?           []   Yes       [x]  No    4.  Are you often tired or fatigued during the wake time hours?     []   Yes       [x]  No    5.  Do you fall asleep sitting, reading, watching TV or driving?     []   Yes       [x]  No    6.  Do you often have problems with memory or concentration?     []   Yes       [x]  No    **If patient's score is ?3 they are considered high risk for OSA.  Notify the anesthesiologist of the high risk and document in focus note.    Note:  If the patient's BMI is more than 35 kg m???? , has neck circumference > 40 cm, and/or high blood pressure the risk is greater (?? American Sleep Apnea Association, 2006).

## 2014-06-18 LAB — HEPATIC FUNCTION PANEL
ALT: 11 U/L (ref 10–40)
AST: 11 U/L — ABNORMAL LOW (ref 15–37)
Albumin: 4.3 g/dL (ref 3.4–5.0)
Alkaline Phosphatase: 70 U/L (ref 40–129)
Bilirubin, Direct: <0.2 mg/dL (ref 0.0–0.3)
Total Bilirubin: 0.5 mg/dL (ref 0.0–1.0)
Total Protein: 7.4 g/dL (ref 6.4–8.2)

## 2014-06-18 LAB — BASIC METABOLIC PANEL
Anion Gap: 14 (ref 3–16)
BUN: 9 mg/dL (ref 7–20)
CO2: 27 mmol/L (ref 21–32)
Calcium: 9.6 mg/dL (ref 8.3–10.6)
Chloride: 99 mmol/L (ref 99–110)
Creatinine: 0.6 mg/dL (ref 0.6–1.2)
GFR African American: >60 (ref >60)
GFR Non-African American: >60 (ref >60)
Glucose: 116 mg/dL — ABNORMAL HIGH (ref 70–99)
Potassium: 4 mmol/L (ref 3.5–5.1)
Sodium: 140 mmol/L (ref 136–145)

## 2014-06-18 LAB — AMYLASE: Amylase: 29 U/L (ref 25–115)

## 2014-06-18 MED ORDER — CEFAZOLIN 2000 MG IN D5W 100 ML IVPB
Freq: Once | Status: AC
Start: 2014-06-18 — End: 2014-06-18
  Administered 2014-06-18: 16:00:00 2 g via INTRAVENOUS

## 2014-06-18 MED ORDER — HYDROMORPHONE HCL PF 2 MG/ML IJ SOLN
2 MG/ML | INTRAMUSCULAR | Status: DC | PRN
Start: 2014-06-18 — End: 2014-06-19
  Administered 2014-06-18: 17:00:00 0.5 mg via INTRAVENOUS

## 2014-06-18 MED ORDER — HYDROMORPHONE HCL PF 2 MG/ML IJ SOLN
2 MG/ML | INTRAMUSCULAR | Status: DC | PRN
Start: 2014-06-18 — End: 2014-06-19

## 2014-06-18 MED ORDER — MEPERIDINE HCL 25 MG/ML IJ SOLN
25 MG/ML | INTRAMUSCULAR | Status: DC | PRN
Start: 2014-06-18 — End: 2014-06-19

## 2014-06-18 MED ORDER — HEPARIN SODIUM (PORCINE) 5000 UNIT/ML IJ SOLN
5000 UNIT/ML | Freq: Once | INTRAMUSCULAR | Status: AC
Start: 2014-06-18 — End: 2014-06-18
  Administered 2014-06-18: 15:00:00 5000 [IU] via SUBCUTANEOUS

## 2014-06-18 MED ORDER — NORMAL SALINE FLUSH 0.9 % IV SOLN
0.9 % | Freq: Two times a day (BID) | INTRAVENOUS | Status: DC
Start: 2014-06-18 — End: 2014-06-19

## 2014-06-18 MED ORDER — LABETALOL HCL 5 MG/ML IV SOLN
5 MG/ML | INTRAVENOUS | Status: DC | PRN
Start: 2014-06-18 — End: 2014-06-19

## 2014-06-18 MED ORDER — LACTATED RINGERS IV SOLN
INTRAVENOUS | Status: DC
Start: 2014-06-18 — End: 2014-06-19
  Administered 2014-06-18: 15:00:00 via INTRAVENOUS

## 2014-06-18 MED ORDER — NORMAL SALINE FLUSH 0.9 % IV SOLN
0.9 % | INTRAVENOUS | Status: DC | PRN
Start: 2014-06-18 — End: 2014-06-19

## 2014-06-18 MED ORDER — HYDRALAZINE HCL 20 MG/ML IJ SOLN
20 MG/ML | INTRAMUSCULAR | Status: DC | PRN
Start: 2014-06-18 — End: 2014-06-19

## 2014-06-18 MED ORDER — ONDANSETRON HCL 4 MG/2ML IJ SOLN
4 MG/2ML | INTRAMUSCULAR | Status: DC | PRN
Start: 2014-06-18 — End: 2014-06-19
  Administered 2014-06-18: 17:00:00 4 mg via INTRAVENOUS

## 2014-06-18 MED ORDER — OXYCODONE-ACETAMINOPHEN 5-325 MG PO TABS
5-325 MG | ORAL | Status: DC | PRN
Start: 2014-06-18 — End: 2014-06-19

## 2014-06-18 MED ORDER — LACTATED RINGERS IV SOLN
INTRAVENOUS | Status: AC
Start: 2014-06-18 — End: ?

## 2014-06-18 MED ORDER — OXYCODONE-ACETAMINOPHEN 5-325 MG PO TABS
5-325 MG | ORAL | Status: DC | PRN
Start: 2014-06-18 — End: 2014-06-19
  Administered 2014-06-18 (×2): 1 via ORAL

## 2014-06-18 MED ORDER — BUPIVACAINE HCL (PF) 0.5 % IJ SOLN
0.5 | INTRAMUSCULAR | Status: AC
Start: 2014-06-18 — End: ?

## 2014-06-18 MED ORDER — DIPHENHYDRAMINE HCL 50 MG/ML IJ SOLN
50 MG/ML | Freq: Once | INTRAMUSCULAR | Status: AC | PRN
Start: 2014-06-18 — End: 2014-06-18

## 2014-06-18 MED ORDER — LIDOCAINE HCL (PF) 1 % IJ SOLN
1 % | Freq: Once | INTRAMUSCULAR | Status: AC | PRN
Start: 2014-06-18 — End: 2014-06-18

## 2014-06-18 MED FILL — BUPIVACAINE HCL (PF) 0.5 % IJ SOLN: 0.5 % | INTRAMUSCULAR | Qty: 30

## 2014-06-18 MED FILL — LACTATED RINGERS IV SOLN: INTRAVENOUS | Qty: 1000

## 2014-06-18 MED FILL — HYDROMORPHONE HCL PF 2 MG/ML IJ SOLN: 2 MG/ML | INTRAMUSCULAR | Qty: 1

## 2014-06-18 MED FILL — HEPARIN SODIUM (PORCINE) 5000 UNIT/ML IJ SOLN: 5000 UNIT/ML | INTRAMUSCULAR | Qty: 1

## 2014-06-18 MED FILL — ROXICET 5-325 MG PO TABS: 5-325 MG | ORAL | Qty: 1

## 2014-06-18 MED FILL — CEFAZOLIN 2000 MG IN D5W 100 ML IVPB: Qty: 2

## 2014-06-18 MED FILL — LIDOCAINE HCL (PF) 1 % IJ SOLN: 1 % | INTRAMUSCULAR | Qty: 2

## 2014-06-18 MED FILL — ONDANSETRON HCL 4 MG/2ML IJ SOLN: 4 MG/2ML | INTRAMUSCULAR | Qty: 2

## 2014-06-18 NOTE — Progress Notes (Signed)
Report from RN J.Green and CRNA. Pt arrived to PACU from OR. See doc flow for vitals and assessment. Will monitor.

## 2014-06-18 NOTE — Anesthesia Post-Procedure Evaluation (Signed)
Postoperative Anesthesia Note    Name:    Lisa FordyceLinda S York  MRN:      [id number]    Patient Vitals for the past 12 hrs:   BP Temp Temp src Pulse Resp SpO2   06/18/14 1215 174/82 mmHg 97.3 ??F (36.3 ??C) Temporal 93 16 92 %   06/18/14 1032 123/80 mmHg 97.7 ??F (36.5 ??C) Temporal 80 16 94 %        LABS:    CBC  Lab Results   Component Value Date/Time    WBC 9.7 05/08/2009  9:17 AM    HGB 14.3 05/08/2009  9:17 AM    HCT 41.6 05/08/2009  9:17 AM    PLT 300 05/08/2009  9:17 AM     RENAL  Lab Results   Component Value Date/Time    NA 140 06/18/2014 10:38 AM    K 4.0 06/18/2014 10:38 AM    CL 99 06/18/2014 10:38 AM    CO2 27 06/18/2014 10:38 AM    BUN 9 06/18/2014 10:38 AM    CREATININE 0.6 06/18/2014 10:38 AM    GLUCOSE 116* 06/18/2014 10:38 AM     COAGS  No results found for this basename: protime, inr, aptt       Intake & Output:  In: 800 [I.V.:800]  Out: -     Nausea & Vomiting:  No    Level of Consciousness:  Awake    Pain Assessment:  Adequate analgesia    Anesthesia Complications:  No apparent anesthetic complications    SUMMARY      Vital signs stable  OK to discharge from Stage I post anesthesia care.  Care transferred from Anesthesiology department on discharge from perioperative area

## 2014-06-18 NOTE — Progress Notes (Signed)
Pt converted to postop phase two status.

## 2014-06-18 NOTE — Plan of Care (Signed)
Phase I (PACU)  1.  Patient is identified using name and the date of birth.  2.  The patient is free from signs and symptoms of chemical, electrical, laser, radiation, positioning, or transfer/transport injury.  3.  The patient receives appropriate medication(s), safely administered during the Perioperative period.  4.  The patient has wound/tissue perfusion consistent with or improved from baseline levels established preoperatively.  5.  The patient is at or returning to normothermia at the conclusion of the immediate postoperative period.  6.  The patient's fluid, electrolyte, and acid base balances are consistent with or improved from baseline levels established preoperatively.  7.  The patient's pulmonary function is consistent with or improved from baseline levels established preoperatively.  8.  The patient's cardiovascular status is consistent with or improved from baseline levels established preoperatively.  9.  The patient/caregiver participates in decisions affecting his or her Perioperative plan of care.  10.  The patient's care is consistent with the individualized Perioperative plan of care.  11.  The patient's right to privacy is maintained.  12.  The patient is the recipient of competent and ethical care within legal standards of practice.  13.  The patient's value system, lifestyle, ethnicity, and culture are considered, respected, and incorporated in the Perioperative plan of care.  14.  The patient demonstrates and/or reports adequate pain control throughout the the Perioperative period.  15.  The patient's neurological status is consistent with or improved from baseline levels established preoperatively.  16.  The patient/caregiver demonstrates knowledge of the expected responses to the operative or invasive procedure.  17.  Patient/Caregiver has reduced anxiety.  Interventions- Familiarize with environment and equipment.  18.  Other:  19.Other:

## 2014-06-18 NOTE — Brief Op Note (Signed)
Brief Postoperative Note    Lisa FordyceLinda S York  Date of Birth:  Apr 19, 1951  [id number]    Pre-operative Diagnosis: Chronic cholecystitis    Post-operative Diagnosis: Same    Procedure: Lap chole    Anesthesia: General    Surgeons/Assistants: Kenyata Napier THOMAS Nattie Lazenby      Estimated Blood Loss: less than 50     Complications: None    Specimens: GB    Findings: Uneventful    Electronically signed by Murvin DonningMARK THOMAS Yeilin Zweber, MD on 06/18/2014 at 12:14 PM

## 2014-06-18 NOTE — Progress Notes (Signed)
D/c instructions read and understood to pt and family.

## 2014-06-18 NOTE — Anesthesia Pre-Procedure Evaluation (Signed)
Lisa FordyceLinda S York     Anesthesia Evaluation     Patient summary reviewed and Nursing notes reviewed    No history of anesthetic complications   Airway   Mallampati: II  Neck ROM: full  Dental      Pulmonary    ROS comment: Tobacco abuse  Cardiovascular     ECG reviewed    Neuro/Psych    GI/Hepatic/Renal      Endo/Other    Abdominal                   Allergies: Review of patient's allergies indicates no known allergies.    NPO Status: Time of last liquid consumption: 0000                       Time of last solid food consumption: 0000            Date of last liquid consumption: 06/17/14            Date of last solid food consumption: 06/17/14    Anesthesia Plan    ASA 2     general   (Medications & allergies reviewed  All available lab & EKG data reviewed)  intravenous induction   Anesthetic plan and risks discussed with patient.    Plan discussed with CRNA.          Garner GavelNORBERT J Ashely Joshua, MD  06/18/2014

## 2014-06-18 NOTE — Discharge Instructions (Addendum)
[facility]     David T. Ward, M.D.   [facility]      [facility]        [facility]               [address]                [address]   Jeff Welshhans, M.D.              [address]           [address]          [place] [address]         [place] [address]   Brian M. Shiff, M.D                         [phone]         [phone]        [facility]        [facility]                    Mark T. Poynter, M.D.          [facility]       POST-OPERATIVE INSTRUCTIONS FOR GALLBLADDER SURGERY    ? Call the office to schedule your post-operative appointment with your surgeon for two (2) weeks.     ? You will have either white steri-strips and a water occlusive dressing or surgical glue closing your incisions.    ? If you have clear bandages over your incisions, you may remove them in 2 days.  Leave the steri-stips in place. These will peel away in 7-10 days.     ? You may shower in 2 days after removing your dressing.  Wash incisions gently, and pat them dry. Do not rub your incisions.    ? General guidelines for activity:    Avoid strenuous activity or lifting anything heavier than 15 pounds.     It is OK to be up  walking around, and walking up and down stairs.      Do what is comfortable: stop and rest when you feel tired.   ? Drink plenty of fluids and stay on a bland diet for 2-3 days after surgery.     ? Do NOT drive while taking your narcotic pain medicine.  You may resume driving when you feel capable of responding to an urgent situation if needed and not taking prescription pain medication.     ? Watch for signs of infection:   Excessive warmth or bright redness around your incisions   Leakage of bloody or cloudy fluid from you incisions   Fever over 100.5  ? During the laparoscopic procedure that you had, gas is pumped into the abdominal cavity.  You may feel abdominal, shoulder, or rib pain for  a few days due to this gas.    ? You will have pain medicine ordered. Take as directed    ? If you experience constipation:  . Increase your water intake  . Increase your activity, walking is best.  . A stool softener or mild laxative may be necessary if you still have not had a bowel movement ; call the office for further instructions.           ANESTHESIA DISCHARGE INSTRUCTIONS          Call your surgeon's office today for your follow-up appointment.   Wear your seatbelt home.   You are under the influence of drugs-do not drink alcohol, drive, operate machinery, or make any important decisions for 24 hours.  Children should not ride bikes, skate boards or play on gym sets  for 24 hours after surgery.   A responsible adult needs to be with you for 24 hours.   You may experience lightheadedness, dizziness, or sleepiness following surgery.   Rest at home today- increase activity as tolerated or as instructed by your physician.   Progress slowly to a regular diet unless your physician has instructed you otherwise.  Take only clear liquids if nauseated or vomiting.       If nausea becomes a problem call your physician.   Coughing, sore throat, and muscle aches are other side effects of anesthesia,and should improve with time.      ? Note:  If you have steri-strips, they should peel away in 7-10 days.  If they do not peel away, remove steri-strips on the 10th day.          **If unable to urinate in 6-8 hours after discharge from hospital, return to ED for evaluation.       Notify your physician if you have:   - Excessive bleeding, drainage, redness, or other problems at the                     surgical site.   - Persistent nausea, vomiting, or diarrhea.   - Severe pain after taking prescribed pain medication.   - Hives, rash, or itching.   - Numbness, tingling, increased pain, or bluish, white, cool extremities             around a cast, ace bandage, or dressing.   - Temperature over 100 degrees Farenheit.   - If  you cannot reach your physician for any concern, please go with             this paper to an emergency facility nearest to you.            The following instructions are to be followed if you have a known history or diagnosis of sleep apnea:    For all sleep apnea patients:  ? Sleep on your side or sitting up in a chair whenever possible, especially the first 24 hours after surgery.  ? Use only medicines prescribed by your doctor.    ? Do not drink alcohol.  ? If you have a dental device to assist you while at rest, use it at all times for the first 24 hours.  For patients using CPAP machines:  ? Use your CPAP machine during all periods of sleep as usual.  ? Use your CPAP machine during all periods of daytime rest while on pain medicines.  ** Follow up with your primary care doctor for continued care.      [x]        I have received and reviewed these instructions with the nurse and I understand them.  If I have any problems or questions at a later time, I will call my physician.      May you continue your journey with strength and a peaceful heart.    [facility].

## 2014-06-18 NOTE — Progress Notes (Signed)
Pasero Opioid-induced Sedation Scale (POSS)    Check the box that best describes the patient at this time:    [] S= Sleep, easy to arouse  [x] 1= Awake and alert  [] 2= Slightly drowsy, easily aroused  Status:  Acceptable  Intervention:   No action necessary    [] 3=Frequently drowsy, arousable, drifts off to sleep during conversation  Status:  Unacceptable  Intervention:  Monitor respiratory status and sedation level every 15 minutes until sedation level is stable (at less than 3); notify anesthesiologist prior to administering further opioid medication(s).    [] 4= Minimal or no response to verbal or physical stimulation  Status:  Unacceptable  Intervention:  Monitor respiratory status and sedation level every 15 minutes until sedation level is stable (at less than 3); notify anesthesiologist immediately.    Jaxen Samples, Michaeal Davis, RN 06/18/2014

## 2014-06-18 NOTE — H&P (Signed)
[facility]      The H&P was reviewed, the patient was examined, and no change has occurred in the patient's condition since the H&P was completed. The indications for the procedure were reviewed, and any questions were answered.    I updated the progress note [facility] 06[facility]07/2014 [facility] which is the H&P and located in the media section..Marland Kitchen

## 2014-06-18 NOTE — Plan of Care (Signed)
Pre-Operative:  1.  Patient/Caregiver identifies - states name and date of birth.  2.  The patient is free from signs and symptoms of injury.  3.  The patient receives appropriate medication(s), safely administered during the Perioperative period.  4.  The patient is free from signs and symptoms of infection.  5.  The patient has wound / tissue perfusion.  6.  The patients's fluid, electrolyte, and acid-base balances are established preoperatively.  7.  The patient's pulmonary function is established preoperatively.  8.  The patient's cardiovascular status is established preoperatively.  9.  The patient / caregiver demonstrates knowledge of nutritional management related to the operative or other invasive procedure.  10.  The patient/caregiver demonstrates knowledge of medication management.  11.  The patient/caregiver demonstrates knowledge of pain management.  12.  The patient participates in the rehabilitation process as applicable.  13.  The patient/caregiver participates in decisions affection his or her Perioperative plan of care.  14.  The patient's care is consistent with the individualized Perioperative plan of care.  15.  The patient's right to privacy is maintained.  16.  The patient is the recipient of competent and ethical care within legal standards of practice.  17.  The patient's value system, lifestyle, ethnicity, and culture are considered, respected, and incorporated in the Perioperative plan of care and understands special services available.  18.  The patient demonstrates and/or reports adequate pain control throughout the the Perioperative period.  19.  The patient's neurological status is established preoperatively.  20.  The patient/caregiver demonstrates knowledge of the expected responses to the operative or invasive procedure.  21.  Patient/Caregiver has reduced anxiety.  Interventions- Familiarize with environment and equipment.  22. Patient/Caregiver verbalizes understanding of Phase I  and Phase II process.  23.  Patient pain level is established preoperatively using age appropriate pain scale.  24.  The patient will move to fall risk upon sedation- during and through the recovery phase.  Interventions- orient the patient to the environment, especially the location of the bathroom; provide treaded socks/non-skid footwear; demonstrate and teach back use of the nurse's call system; instruct the patient to call for help before getting out of bed; lock all movable equipment before transferring patient; keep bed in lowest position possible. 25.  Other:

## 2014-06-18 NOTE — Progress Notes (Signed)
Pasero Opioid-induced Sedation Scale (POSS)    Check the box that best describes the patient at this time:    []  S= Sleep, easy to arouse  [x]  1= Awake and alert  []  2= Slightly drowsy, easily aroused  Status:  Acceptable  Intervention:   No action necessary    []  3=Frequently drowsy, arousable, drifts off to sleep during conversation  Status:  Unacceptable  Intervention:  Monitor respiratory status and sedation level every 15 minutes until sedation level is stable (at less than 3); notify anesthesiologist prior to administering further opioid medication(s).    []  4= Minimal or no response to verbal or physical stimulation  Status:  Unacceptable  Intervention:  Monitor respiratory status and sedation level every 15 minutes until sedation level is stable (at less than 3); notify anesthesiologist immediately.    Magnus Ivanobert, Shantay Sonn, RN 06/18/2014

## 2014-06-18 NOTE — Plan of Care (Signed)
Phase II:  1.  Patient is identified using name and the date of birth.  2.  The patient is free from signs and symptoms of chemical, electrical, laser, radiation, positioning, or transfer/transport injury.  3.  The patient receives appropriate medication(s), safely administered during the Perioperative period.  4.  The patient has wound/tissue perfusion consistent with or improved from baseline levels established preoperatively.  5.  The patient is at or returning to normothermia at the conclusion of the immediate postoperative period.  6.  The patient's fluid, electrolyte, and acid base balances are consistent with or improved from baseline levels established preoperatively.  7.  The patient's pulmonary function is consistent with or improved from baseline levels established preoperatively.  8.  The patient's cardiovascular status is consistent with or improved from baseline levels established preoperatively.  9.  The patient/caregiver demonstrates knowledge of nutritional management related to the operative or other invasive procedure.  10.  The patient/caregiver demonstrates knowledge of medication, pain, and wound management.  11.  The patient participates in the rehabilitation process as applicable.  12.  The patient/caregiver participates in decisions affection his or her Perioperative plan of care.  13.  The patient's care is consistent with the individualized Perioperative plan of care.  14.  The patient's right to privacy is maintained.  15.  The patient is the recipient of competent and ethical care within legal standards of practice.  16.  The patient's value system, lifestyle, ethnicity, and culture are considered, respected, and incorporated in the Perioperative plan of care and understands special services available.  17.  The patient demonstrates and/or reports adequate pain control throughout the the Perioperative period.  18.  The patient's neurological status is consistent with or improved from  baseline levels established preoperatively.  19.  The patient/caregiver demonstrates knowledge of the expected responses to the operative or invasive procedure.  20.  Patient/Caregiver has reduced anxiety.  Interventions- Familiarize with environment and equipment.  21. Other:  22. Other:

## 2014-06-18 NOTE — Progress Notes (Signed)
Pasero Opioid-induced Sedation Scale (POSS)    Check the box that best describes the patient at this time:    [] S= Sleep, easy to arouse  [x] 1= Awake and alert  [] 2= Slightly drowsy, easily aroused  Status:  Acceptable  Intervention:   No action necessary    [] 3=Frequently drowsy, arousable, drifts off to sleep during conversation  Status:  Unacceptable  Intervention:  Monitor respiratory status and sedation level every 15 minutes until sedation level is stable (at less than 3); notify anesthesiologist prior to administering further opioid medication(s).    [] 4= Minimal or no response to verbal or physical stimulation  Status:  Unacceptable  Intervention:  Monitor respiratory status and sedation level every 15 minutes until sedation level is stable (at less than 3); notify anesthesiologist immediately.    Allura Doepke, Samantha Olivera, RN 06/18/2014

## 2014-06-19 LAB — EKG 12-LEAD
Atrial Rate: 84 {beats}/min
P Axis: 52 degrees
P-R Interval: 170 ms
Q-T Interval: 382 ms
QRS Duration: 88 ms
QTc Calculation (Bazett): 451 ms
R Axis: 16 degrees
T Axis: 46 degrees
Ventricular Rate: 84 {beats}/min

## 2014-06-19 MED FILL — PROPOFOL 10 MG/ML IV EMUL: 10 MG/ML | INTRAVENOUS | Qty: 20

## 2014-06-19 MED FILL — QUELICIN 20 MG/ML IJ SOLN: 20 MG/ML | INTRAMUSCULAR | Qty: 10

## 2014-06-19 MED FILL — ONDANSETRON HCL 4 MG/2ML IJ SOLN: 4 MG/2ML | INTRAMUSCULAR | Qty: 2

## 2014-06-19 MED FILL — FENTANYL CITRATE 0.05 MG/ML IJ SOLN: 0.05 MG/ML | INTRAMUSCULAR | Qty: 5

## 2014-06-19 MED FILL — DEXAMETHASONE SODIUM PHOSPHATE 4 MG/ML IJ SOLN: 4 MG/ML | INTRAMUSCULAR | Qty: 2

## 2014-06-20 NOTE — Op Note (Signed)
PATIENT NAME:                 PA #:            MR #Lisa York:                 Lisa York, Lisa York                   [id number]       [id number]            SURGEON:                              SURG DATE:  DIS DATE:          Murvin DonningMARK THOMAS [name], MD               06/18/2014  06/18/2014         DATE OF BIRTH:   AGE:           PATIENT TYPE:     RM #:              1951/01/22       62             [facility]                                     OPERATION PERFORMED:  Laparoscopic cholecystectomy.     PREOPERATIVE DIAGNOSIS:  Chronic cholecystitis.     POSTOPERATIVE DIAGNOSIS:  Chronic cholecystitis.      ANESTHESIA:  General.      COMPLICATIONS:  None.      INDICATIONS FOR THE OPERATION:  A 63 year old female who presented with  recurring episodes of epigastric and right upper quadrant pain.  Her workup  showed a HIDA scan with a 0% ejection fraction.  Her symptoms sounded  biliary.  Liver enzymes were unremarkable preoperatively.  The risks,  benefits, and alternatives of operative intervention were explained, and the  patient understood these, accepted these, and elected to proceed.       DESCRIPTION OF OPERATION:  The patient was brought to the operating room.   She was placed supine on the operating room table.  General anesthesia was  induced.  She was prepped and draped in the usual surgical sterile fashion.      A vertical supraumbilical incision was made with the knife.  Subcutaneous  tissues were spread.  A penetrating towel clip was used to elevate the  anterior abdominal wall.  The Veress needle was inserted and pneumoperitoneum  was established.  A disposable 5 mm trocar was passed through the incision.   The laparoscope was inserted and under direct vision an 11 mm port was placed  in the epigastrium and two 5 mm ports in the right upper quadrant.  We had  adequate visualization and retraction.  I identified the cystic duct as it  tapered into the gallbladder.  Three clips were placed away from the  gallbladder and 1 clip next to the  gallbladder and the cystic duct was  divided.  The cystic artery had 2 clips placed proximal and 1 clip distal and  it was divided.  The gallbladder was dissected from the gallbladder fossa of  the liver bed.  The gallbladder was brought out through the epigastric  incision.  I re-inspected the right upper quadrant.  I  copiously irrigated  the area.  I suctioned out the irrigant.  There was no evident bleeding, bile  leak, or complication.  I deflated the abdomen and removed the trocars.  The  fascia at the epigastric port site was reapproximated with 0 Vicryl suture.   Local anesthetic was infiltrated.  A 4-0 Vicryl was used to reapproximate the  skin at all the incisions.  Benzoin and Steri-Strips dressings were placed.      DISPOSITION:  The patient tolerated the procedure without any acute  complication.                                            [name] THOMAS [name], MD     [name]  DD: 06/20/2014 05:35   DT: 06/20/2014 06:27   Job #: [id number]  CC: Tadd Holtmeyer Cleatis PolkaHOMAS [name], MD  CC: Mirna MiresYLER CAMPBELL, MD

## 2014-06-25 MED ORDER — HYDROCODONE-ACETAMINOPHEN 10-325 MG PO TABS
10-325 MG | ORAL_TABLET | Freq: Four times a day (QID) | ORAL | Status: DC | PRN
Start: 2014-06-25 — End: 2014-07-24

## 2014-06-25 NOTE — Telephone Encounter (Signed)
Unable to re-run [place] as system (OARRS web site) is not functioning properly however [place] previously reviewed on 03-28-14 with no worrisome findings.

## 2014-07-24 ENCOUNTER — Encounter

## 2014-07-24 MED ORDER — TEMAZEPAM 15 MG PO CAPS
15 MG | ORAL_CAPSULE | Freq: Every evening | ORAL | Status: DC | PRN
Start: 2014-07-24 — End: 2014-10-16

## 2014-07-24 MED ORDER — HYDROCODONE-ACETAMINOPHEN 10-325 MG PO TABS
10-325 MG | ORAL_TABLET | Freq: Four times a day (QID) | ORAL | Status: DC | PRN
Start: 2014-07-24 — End: 2014-08-22

## 2014-07-24 NOTE — Telephone Encounter (Signed)
Controlled Substances Monitoring: Attestation: The Prescription Monitoring Report for this patient was reviewed today. (Lailyn Appelbaum D Randen Kauth, CNP)  Documentation: No signs of potential drug abuse or diversion identified. (Braxon Suder D Kitiara Hintze, CNP)

## 2014-07-30 LAB — CBC
Hematocrit: 44 % (ref 36.0–48.0)
Hemoglobin: 14.5 g/dL (ref 12.0–16.0)
MCH: 30.9 pg (ref 26.0–34.0)
MCHC: 32.9 g/dL (ref 31.0–36.0)
MCV: 94 fL (ref 80.0–100.0)
MPV: 8.4 fL (ref 5.0–10.5)
Platelets: 283 10*3/uL (ref 135–450)
RBC: 4.68 M/uL (ref 4.00–5.20)
RDW: 12.8 % (ref 12.4–15.4)
WBC: 10.7 10*3/uL (ref 4.0–11.0)

## 2014-07-30 LAB — COMPREHENSIVE METABOLIC PANEL
ALT: 56 U/L — ABNORMAL HIGH (ref 10–40)
AST: 39 U/L — ABNORMAL HIGH (ref 15–37)
Albumin/Globulin Ratio: 1.8 (ref 1.1–2.2)
Albumin: 4.6 g/dL (ref 3.4–5.0)
Alkaline Phosphatase: 94 U/L (ref 40–129)
Anion Gap: 15 (ref 3–16)
BUN: 9 mg/dL (ref 7–20)
CO2: 25 mmol/L (ref 21–32)
Calcium: 10.3 mg/dL (ref 8.3–10.6)
Chloride: 98 mmol/L — ABNORMAL LOW (ref 99–110)
Creatinine: 0.6 mg/dL (ref 0.6–1.2)
GFR African American: >60 (ref >60)
GFR Non-African American: >60 (ref >60)
Globulin: 2.6 g/dL
Glucose: 96 mg/dL (ref 70–99)
Potassium: 4.5 mmol/L (ref 3.5–5.1)
Sodium: 138 mmol/L (ref 136–145)
Total Bilirubin: 0.3 mg/dL (ref 0.0–1.0)
Total Protein: 7.2 g/dL (ref 6.4–8.2)

## 2014-07-30 LAB — TSH: TSH: 0.59 u[IU]/mL (ref 0.27–4.20)

## 2014-07-30 LAB — T4, FREE: T4 Free: 1.2 ng/dL (ref 0.9–1.8)

## 2014-07-30 MED ORDER — ATORVASTATIN CALCIUM 10 MG PO TABS
10 MG | ORAL_TABLET | Freq: Every day | ORAL | Status: DC
Start: 2014-07-30 — End: 2014-10-16

## 2014-07-30 NOTE — Progress Notes (Signed)
Subjective:      Patient ID: Fotini S Dolores is a [age] y.[place]. female.    Chest Pain   This is a new problem. Episode onset: 2 weeks. The onset quality is sudden. The problem occurs intermittently (2-3 episodes since gallbadder was removed). The problem has been waxing and waning. The pain is present in the epigastric region. The pain is at a severity of 10/10. The pain is severe. The quality of the pain is described as sharp. The pain radiates to the mid back. Associated symptoms include back pain, headaches (ongoing issue - no change) and lower extremity edema (occasional bilat feet swelling; resolved by am). Pertinent negatives include no abdominal pain, claudication, cough, diaphoresis, dizziness, exertional chest pressure, fever, hemoptysis, irregular heartbeat, leg pain, nausea, near-syncope, numbness, shortness of breath, syncope or vomiting. The pain is aggravated by nothing. She has tried nothing for the symptoms. Risk factors include smoking/tobacco exposure and post-menopausal.   Her family medical history is significant for heart disease.   Patient reports she had normal angiogram approximately 6-7 years ago. Patient reports prior to angiogram she was having similar symptoms and was thought be anxiety related.     Review of Systems   Constitutional: Negative for fever and diaphoresis.   Respiratory: Negative for cough, hemoptysis and shortness of breath.    Cardiovascular: Positive for chest pain. Negative for claudication, syncope and near-syncope.   Gastrointestinal: Negative for nausea, vomiting and abdominal pain.   Musculoskeletal: Positive for back pain.   Neurological: Positive for headaches (ongoing issue - no change). Negative for dizziness and numbness.       Objective:   Physical Exam   Constitutional: She is oriented to person, place, and time. She appears well-developed and well-nourished.   HENT:   Head: Normocephalic and atraumatic.   Eyes: Conjunctivae and EOM are normal. Pupils are equal, round,  and reactive to light.   Neck: Normal range of motion. Neck supple.   Cardiovascular: Normal rate, regular rhythm and normal heart sounds.    Pulmonary/Chest: Effort normal and breath sounds normal.   Abdominal: Soft. Bowel sounds are normal.   Musculoskeletal: Normal range of motion.   Neurological: She is alert and oriented to person, place, and time.   Skin: Skin is warm and dry.   Psychiatric: She has a normal mood and affect.   Vitals reviewed.    BP 128/82    Pulse 89    Temp(Src) 98.4 ??F (36.9 ??C) (Tympanic)    Ht 5\' 7"  (1.702 m)    Wt 184 lb (83.462 kg)    BMI 28.81 kg/m2      SpO2 98%     Assessment/Plan:      [name] was seen today for chest pain.    Diagnoses and associated orders for this visit:    Chest pain: Reports non-exertional chest pain. prior similar episodes that was thought to be anxiety related as patient reports angiogram was normal. Last episodes 1 week ago. Discussed possible causes such as thyroid, cardiovascular, GERD and PUD for example. Patient denies associated symptoms such as sob, diaphoresis, jaw pain, palpitations or cough. Patient denies, changes in appetite or food intolerances, n/v or changes in bowel. Patient currently on omeprazole daily. Order for EKG and labs as below. EKG done if office today was compared with EKG from 06/18/2014, no changes noted.  - EKG 12 Lead; Future  - CBC  - COMPREHENSIVE METABOLIC PANEL  - TSH without Reflex  - T4, FREE  Anxiety: ongoing issues. ? Cause of recent symptoms. Labs and EKG as above.     Hyperlipidemia: reviewed recent lipids and discussed importance of lowering cholesterol levels to reduce CV risks. Patient agree to try lowest dose of atorvastatin. Order as below.   - atorvastatin (LIPITOR) 10 MG tablet; Take 1 tablet by mouth daily    Tobacco abuse: smokes 1 PPD. Discussed smoking cessation and patient agreeable to try to cut down on smoking. Also see patient instructions.

## 2014-07-30 NOTE — Patient Instructions (Addendum)
atorvastatin  Pronunciation: a TOR va sta tin  Brand: Lipitor  What is atorvastatin?  Atorvastatin is in a group of drugs called HMG CoA reductase inhibitors, or "statins." Atorvastatin reduces levels of "bad" cholesterol (low-density lipoprotein, or LDL) and triglycerides in the blood, while increasing levels of "good" cholesterol (high-density lipoprotein, or HDL).  Atorvastatin is used to treat high cholesterol, and to lower the risk of stroke, heart attack, or other heart complications in people with type 2 diabetes, coronary heart disease, or other risk factors.  Atorvastatin is used in adults and children who are at least 40 years old.  Atorvastatin may also be used for purposes not listed in this medication guide.  What should I discuss with my healthcare provider before taking atorvastatin?  You should not take atorvastatin if you are allergic to it, if you are pregnant or breast-feeding, or if you have liver disease.  To make sure you can safely take atorvastatin, tell your doctor if you have any of these other conditions:  ?? history of liver disease;  ?? history of kidney disease;  ?? muscle pain or weakness;  ?? a thyroid disorder; or  ?? if you drink more than 2 alcoholic beverages daily.  In rare cases, atorvastatin can cause a condition that results in the breakdown of skeletal muscle tissue, leading to kidney failure. This condition may be more likely to occur in older adults and in people who have kidney disease or poorly controlled hypothyroidism (underactive thyroid).  Tell your doctor about all other medications you use. Certain other drugs can increase your risk of serious muscle problems, and it is very important that your doctor knows if you are using any of them:  ?? diltiazem (Cardizem, Cartia, Dilacor, Diltia, Diltzac, Orpah Cobb, Tiazac);  ?? gemfibrozil (Lopid), fenofibric acid (Fibricor, Trilipix), or fenofibrate (Antara, Fenoglide, Lipofen, Lofibra, Tricor, Triglide);  ?? telaprevir  (Incivek);  ?? antibiotics such as clarithromycin (Biaxin) or erythromycin (E.E.S., EryPed, Ery-Tab, Erythrocin, Pediazole);  ?? antifungal medicines such as fluconazole (Diflucan), itraconazole (Sporanox), ketoconazole (Nizoral), or voriconazole (Vfend);  ?? HIV medications such as darunavir (Prexista), fosamprenavir (Lexiva), ritonavir (Norvir), lopinavir/ritonavir (Kaletra), nelfinavir (Viracept), saquinavir (Invirase), or tipranavir (Aptivus);  ?? medicines that contain niacin (Advicor, Niaspan, Niacor, Simcor, Slo-Niacin, and others); or  ?? drugs that weaken your immune system, such as steroids, cancer medicine, or medicines used to prevent organ transplant rejection, such as cyclosporine (Gengraf, Neoral, Sandimmune), sirolimus (Rapamune), or tacrolimus (Prograf).  FDA pregnancy category X. This medication can harm an unborn baby or cause birth defects. Do not take atorvastatin if you are pregnant.Stop taking this medication and tell your doctor right away if you become pregnant. Use effective birth control to avoid pregnancy while you are taking atorvastatin.  Atorvastatin may pass into breast milk and could harm a nursing baby. Do not  breast-feed while you are taking atorvastatin.  How should I take atorvastatin?  Take exactly as prescribed by your doctor. Do not take in larger or smaller amounts or for longer than recommended. Follow the directions on your prescription label. Do not break an atorvastatin tablet unless your doctor has told you.  Atorvastatin is usually taken once a day, with or without food. Take the medicine at the same time each day. Your doctor may occasionally change your dose to make sure you get the best results.  You may need to stop using atorvastatin for a short time if you have:  ?? uncontrolled seizures;  ?? an electrolyte imbalance (such as high or  low potassium levels in your blood);  ?? severely low blood pressure;  ?? a severe infection or illness; or  ?? surgery or a medical  emergency.  To be sure this medicine is helping your condition, your blood will need to be tested often. Visit your doctor regularly.  Atorvastatin is only part of a complete program of treatment that also includes diet, exercise, and weight control. Follow your diet, medication, and exercise routines very closely. You may need to take atorvastatin on a long-term basis for the treatment of high cholesterol.  Store at room temperature away from moisture, heat, and light.  What happens if I miss a dose?  Take the missed dose as soon as you remember. Skip the missed dose if your next dose is less than 12 hours away. Do not  take extra medicine to make up the missed dose.  What happens if I overdose?  Seek emergency medical attention or call the Poison Help line at 1-800-222-1222.  What should I avoid while taking atorvastatin?  Avoid eating foods that are high in fat or cholesterol. Atorvastatin will not be as effective in lowering your cholesterol if you do not follow a cholesterol-lowering diet plan.  Avoid drinking alcohol. It can raise triglyceride levels and may increase your risk of liver damage.  Grapefruit and grapefruit juice may interact with atorvastatin and lead to potentially dangerous effects. Discuss the use of grapefruit products with your doctor.  What are the possible side effects of atorvastatin?  Get emergency medical help if you have any of these signs of an allergic reaction: hives; difficulty breathing; swelling of your face, lips, tongue, or throat.  Stop taking atorvastatin and call your doctor at once if you have any of these serious side effects:  ?? unexplained muscle pain, tenderness, or weakness;  ?? confusion, memory problems;  ?? fever, unusual tiredness, and dark colored urine;  ?? swelling, weight gain, urinating less than usual or not at all;  ?? increased thirst, increased urination, hunger, dry mouth, fruity breath odor, drowsiness, dry skin, blurred vision, weight loss; or  ?? nausea,  upper stomach pain, itching, loss of appetite, dark urine, clay-colored stools, jaundice (yellowing of the skin or eyes).  Less serious side effects may include:  ?? mild muscle pain;  ?? diarrhea; or  ?? mild nausea.  This is not a complete list of side effects and others may occur. Call your doctor for medical advice about side effects. You may report side effects to FDA at 1-800-FDA-1088.  What other drugs will affect atorvastatin?  Many drugs can interact with atorvastatin. Below is just a partial list. Tell your doctor if you are using:  ?? birth control pills;  ?? cimetidine (Tagamet);  ?? conivaptan (Vaprisol);  ?? imatinib (Gleevec);  ?? isoniazid (for treating tuberculosis);  ?? spironolactone (Aldactone, Aldactazide);  ?? an antibiotic such as dalfopristin/quinupristin (Synercid), rifampin (Rifater, Rifadin, Rifamate), telithromycin (Ketek), and others;  ?? an antidepressant such as nefazodone;  ?? heart or blood pressure medication such as digoxin (Lanoxin), diltiazem (Cartia, Cardizem), nicardipine (Cardene), quinidine (Quin-G), verapamil (Calan, Covera, Isoptin, Verelan), and others;  ?? HIV/AIDS medicine such as atazanavir (Reyataz), delavirdine (Rescriptor), efavirenz (Sustiva, Atripla), indinavir (Crixivan), and others; or  ?? any other "statin" medication such as amlodipine and atorvastatin (Caduet), fluvastatin (Lescol), lovastatin (Altoprev, Mevacor), pravastatin (Pravachol), rosuvastatin (Crestor), or simvastatin (Zocor, Simcor, Vytorin).  This list is not complete and there are many other drugs that can increase your risk of serious medical problems if   you take them together with atorvastatin. Tell your doctor about all medications you use. This includes prescription, over-the-counter, vitamin, and herbal products. Do not start a new medication without telling your doctor.  Where can I get more information?  Your pharmacist can provide more information about atorvastatin.    Remember, keep this and all other  medicines out of the reach of children, never share your medicines with others, and use this medication only for the indication prescribed.   Every effort has been made to ensure that the information provided by [employer]') is accurate, up-to-date, and complete, but no guarantee is made to that effect. Drug information contained herein may be time sensitive. [employer] information has been compiled for use by healthcare practitioners and consumers in the [place] and therefore [employer] does not warrant that uses outside of the [place] are appropriate, unless specifically indicated otherwise. [employer]'s drug information does not endorse drugs, diagnose patients or recommend therapy. [employer]'s drug information is an [employer] corporate to assist licensed healthcare practitioners in caring for their patients and/or to serve consumers viewing this service as a supplement to, and not a substitute for, the expertise, skill, knowledge and judgment of healthcare practitioners. The absence of a warning for a given drug or drug combination in no way should be construed to indicate that the drug or drug combination is safe, effective or appropriate for any given patient. [employer] does not assume any responsibility for any aspect of healthcare administered with the aid of information [employer] provides. The information contained herein is not intended to cover all possible uses, directions, precautions, warnings, drug interactions, allergic reactions, or adverse effects. If you have questions about the drugs you are taking, check with your doctor, nurse or pharmacist.  Copyright [phone] [employer] Version: 18.02. Revision date: 03/18/2011.  This information does not replace the advice of a doctor. [employer] Incorporated disclaims any warranty or liability for your use of this information.   Content Version: 10.5.422740        Learning About Low-Fat Eating  What is low-fat eating?  Most  food has some fat in it. Your body needs some fat to be healthy. But some kinds of fats are healthier than others.  In a low-fat eating plan, you try to choose healthier fats and eat fewer unhealthy fats. Healthy fats include olive and canola oil. Try to avoid eating too much saturated fat (such as in cheese and meats) and trans fat (a type of fat found in many packaged snack foods and other baked goods).  You do not need to cut all fat from your diet. But you can make healthier choices about the types and amount of fat you eat.  Even though it is a good idea to choose healthier fats, it is still important to be careful of how much fat you eat, because all fats are high in calories.  What are the different types of fats?  Unhealthy fats  ?? Saturated fat. Saturated fats are mostly in animal foods, such as meat and dairy foods. Tropical oils, such as coconut oil, palm oil, and cocoa butter, are also saturated fats.  ?? Trans fat. Trans fats include shortening, partially hydrogenated vegetable oils, and hydrogenated vegetable oils. Trans fats are made when a liquid fat is turned into a solid fat (for example, when corn oil is made into stick margarine). They are in many processed foods, such as cookies, crackers, and snack foods.  ?? Cholesterol. Cholesterol is only  in animal products, such as eggs, dairy foods, and meats.  Healthy fats  ?? Monounsaturated fat. Monounsaturated fats are liquid at room temperature but get solid when refrigerated. Eating foods that are high in this fat may help lower your "bad" (LDL) cholesterol, keep your "good" (HDL) cholesterol level up, and lower your chances of getting heart disease. This fat is found in canola oil, olive oil, peanut oil, olives, avocados, nuts, and nut butters.  ?? Polyunsaturated fat. Polyunsaturated fats are liquid at room temperature. They are in safflower, sunflower, and corn oils. They are also the main fat in seafood. Omega-3 fatty acids are types of  polyunsaturated fat. Omega-3 fatty acids may lower your chances of getting heart disease. Fatty fish such as salmon and mackerel contain these healthy fatty acids. So do ground flaxseeds and flaxseed oil, soybeans, walnuts, and seeds.  Why cut down on unhealthy fats?  Eating foods that contain saturated fats can raise the LDL ("bad") cholesterol in your blood. Having a high level of LDL cholesterol increases your chance of hardening of the arteries (atherosclerosis), which can lead to heart disease, heart attack, and stroke.  Trans fat raises the level of "bad" LDL cholesterol in your blood and may lower the "good" HDL cholesterol in your blood. Trans fat can raise your risk of heart disease, heart attack, and stroke.  In general:  ?? No more than 10% of your daily calories should come from saturated fat. This is about 20 grams in a 2,000-calorie diet.  ?? No more than 10% of your daily calories should come from polyunsaturated fat. This is about 20 grams in a 2,000-calorie diet.  ?? Monounsaturated fats can be up to 15% of your daily calories. This is about 25 to 30 grams in a 2,000-calorie diet.  If you're not sure how much fat you should be eating or how many calories you need each day to stay at a healthy weight, talk to a registered dietitian. He or she can help you create a plan that's right for you.  What can you do to cut down on fat?  Foods like cheese, butter, sausage, and desserts can have a lot of unhealthy fats. Try these tips for healthier meals at home and when you eat out.  At home  ?? Fill up on fruits, vegetables, and whole grains.  ?? Think of meat as a side dish instead of as the main part of your meal.  ?? When you do eat meat, make it extra-lean ground beef (97% lean), ground [place] breast (without skin added), meats with fat trimmed off before cooking, or skinless chicken.  ?? Try main dishes that use whole wheat pasta, brown [place], dried beans, or vegetables.  ?? Use cooking methods that use little or  no fat, such as broiling, steaming, or grilling. Use cooking spray instead of oil. If you use oil, use a monounsaturated oil, such as canola or olive oil.  ?? Read food labels on canned, bottled, or packaged foods. Choose those with little saturated fat and no trans fat.  When eating out at a restaurant  ?? Order foods that are broiled or poached instead of fried or breaded.  ?? Cut back on the amount of butter or margarine that you use on bread. Use small amounts of olive oil instead.  ?? Order sauces, gravies, and salad dressings on the side, and use only a little.  ?? When you order pasta, choose tomato sauce instead of cream sauce.  ?? Ask  for salsa with your baked potato instead of sour cream, butter, cheese, or bacon.   Where can you learn more?   Go to [email or web address] and sign in to your MyChart account. Enter [phone] in the Search Health Information box to learn more about ???Learning About Low-Fat Eating.???    If you do not have an account, please click on the ???Sign Up Now??? link.     ?? 2006-2015 Healthwise, Incorporated. Care instructions adapted under license by [facility]. This care instruction is for use with your licensed healthcare professional. If you have questions about a medical condition or this instruction, always ask your healthcare professional. Healthwise, Incorporated disclaims any warranty or liability for your use of this information.  Content Version: 10.5.422740; Current as of: November 10, 2013              Chest Pain: After Your Visit  Your Care Instructions  There are many things that can cause chest pain. Some are not serious and will get better on their own in a few days. But some kinds of chest pain need more testing and treatment. Your doctor may have recommended a follow-up visit in the next 8 to 12 hours. If you are not getting better, you may need more tests or treatment.  Even though your doctor has released you, you still need to watch for any problems. The  doctor carefully checked you, but sometimes problems can develop later. If you have new symptoms or if your symptoms do not get better, get medical care right away.  If you have worse or different chest pain or pressure that lasts more than 5 minutes or you passed out (lost consciousness), call 911 or seek other emergency help right away.   A medical visit is only one step in your treatment. Even if you feel better, you still need to do what your doctor recommends, such as going to all suggested follow-up appointments and taking medicines exactly as directed. This will help you recover and help prevent future problems.  How can you care for yourself at home?  ?? Rest until you feel better.  ?? Take your medicine exactly as prescribed. Call your doctor if you think you are having a problem with your medicine.  ?? Do not drive after taking a prescription pain medicine.  When should you call for help?  Call 911 if:   ?? You passed out (lost consciousness).  ?? You have severe difficulty breathing.  ?? You have symptoms of a heart attack. These may include:  ?? Chest pain or pressure, or a strange feeling in your chest.  ?? Sweating.  ?? Shortness of breath.  ?? Nausea or vomiting.  ?? Pain, pressure, or a strange feeling in your back, neck, jaw, or upper belly or in one or both shoulders or arms.  ?? Lightheadedness or sudden weakness.  ?? A fast or irregular heartbeat.  After you call 911, the operator may tell you to chew 1 adult-strength or 2 to 4 low-dose aspirin. Wait for an ambulance. Do not try to drive yourself.  Call your doctor today if:   ?? You have any trouble breathing.  ?? Your chest pain gets worse.  ?? You are dizzy or lightheaded, or you feel like you may faint.  ?? You are not getting better as expected.  ?? You are having new or different chest pain.   Where can you learn more?   Go to [email or web address] and sign in to your MyChart account.  Enter A120 in the Search Health Information box to learn  more about ???Chest Pain: After Your Visit.???    If you do not have an account, please click on the ???Sign Up Now??? link.     ?? 2006-2015 Healthwise, Incorporated. Care instructions adapted under license by [facility]. This care instruction is for use with your licensed healthcare professional. If you have questions about a medical condition or this instruction, always ask your healthcare professional. Health[employer], Incorporated disclaims any warranty or liability for your use of this information.  Content Version: 10.5.422740; Current as of: May 31, 2013              Stopping Smoking: After Your Visit  Your Care Instructions  Cigarette smokers crave the nicotine in cigarettes. Giving it up is much harder than simply changing a habit. Your body has to stop craving the nicotine. It is hard to quit, but you can do it. There are many tools that people use to quit smoking. You may find that combining tools works best for you.  There are several steps to quitting. First you get ready to quit. Then you get support to help you. After that, you learn new skills and behaviors to become a nonsmoker. For many people, a necessary step is getting and using medicine.  Your doctor will help you set up the plan that best meets your needs. You may want to attend a smoking cessation program to help you quit smoking. When you choose a program, look for one that has proven success. Ask your doctor for ideas. You will greatly increase your chances of success if you take medicine as well as get counseling or join a cessation program.  Some of the changes you feel when you first quit tobacco are uncomfortable. Your body will miss the nicotine at first, and you may feel short-tempered and grumpy. You may have trouble sleeping or concentrating. Medicine can help you deal with these symptoms. You may struggle with changing your smoking habits and rituals. The last step is the tricky one: Be prepared for the smoking urge to continue for a time.  This is a lot to deal with, but keep at it. You will feel better.  Follow-up care is a key part of your treatment and safety. Be sure to make and go to all appointments, and call your doctor if you are having problems. It???s also a good idea to know your test results and keep a list of the medicines you take.  How can you care for yourself at home?  ?? Ask your family, friends, and coworkers for support. You have a better chance of quitting if you have help and support.  ?? Join a support group, such as Nicotine Anonymous, for people who are trying to quit smoking.  ?? Consider signing up for a smoking cessation program, such as the American Lung Association's Freedom from Smoking program.  ?? Set a quit date. Pick your date carefully so that it is not right in the middle of a big deadline or stressful time. Once you quit, do not even take a puff. Get rid of all ashtrays and lighters after your last cigarette. Clean your house and your clothes so that they do not smell of smoke.  ?? Learn how to be a nonsmoker. Think about ways you can avoid those things that make you reach for a cigarette.  ?? Avoid situations that put you at greatest risk for smoking. For some people, it is hard  to have a drink with friends without smoking. For others, they might skip a coffee break with coworkers who smoke.  ?? Change your daily routine. Take a different route to work or eat a meal in a different place.  ?? Cut down on stress. Calm yourself or release tension by doing an activity you enjoy, such as reading a book, taking a hot bath, or gardening.  ?? Talk to your doctor or pharmacist about nicotine replacement therapy, which replaces the nicotine in your body. You still get nicotine but you do not use tobacco. Nicotine replacement products help you slowly reduce the amount of nicotine you need. These products come in several forms, many of them available over-the-counter:  ?? Nicotine patches  ?? Nicotine gum and lozenges  ?? Nicotine  inhaler  ?? Ask your doctor about bupropion (Wellbutrin) or varenicline (Chantix), which are prescription medicines. They do not contain nicotine. They help you by reducing withdrawal symptoms, such as stress and anxiety.  ?? Some people find hypnosis, acupuncture, and massage helpful for ending the smoking habit.  ?? Eat a healthy diet and get regular exercise. Having healthy habits will help your body move past its craving for nicotine.  ?? Be prepared to keep trying. Most people are not successful the first few times they try to quit. Do not get mad at yourself if you smoke again. Make a list of things you learned and think about when you want to try again, such as next week, next month, or next year.   Where can you learn more?   Go to [email or web address] and sign in to your MyChart account. Enter [id number] in the Search Health Information box to learn more about ???Stopping Smoking: After Your Visit.???    If you do not have an account, please click on the ???Sign Up Now??? link.     ?? 2006-2015 Healthwise, Incorporated. Care instructions adapted under license by [facility]. This care instruction is for use with your licensed healthcare professional. If you have questions about a medical condition or this instruction, always ask your healthcare professional. Healthwise, Incorporated disclaims any warranty or liability for your use of this information.  Content Version: 10.5.422740; Current as of: September 05, 2013

## 2014-08-01 ENCOUNTER — Encounter

## 2014-08-22 MED ORDER — HYDROCODONE-ACETAMINOPHEN 10-325 MG PO TABS
10-325 MG | ORAL_TABLET | Freq: Four times a day (QID) | ORAL | Status: DC | PRN
Start: 2014-08-22 — End: 2014-09-21

## 2014-09-21 MED ORDER — HYDROCODONE-ACETAMINOPHEN 10-325 MG PO TABS
10-325 MG | ORAL_TABLET | Freq: Four times a day (QID) | ORAL | Status: DC | PRN
Start: 2014-09-21 — End: 2014-10-16

## 2014-09-25 MED ORDER — IBUPROFEN 600 MG PO TABS
600 MG | ORAL_TABLET | ORAL | Status: DC
Start: 2014-09-25 — End: 2018-10-03

## 2014-10-03 ENCOUNTER — Encounter: Attending: Family | Primary: Family Medicine

## 2014-10-03 ENCOUNTER — Ambulatory Visit: Admit: 2014-10-03 | Discharge: 2014-10-03 | Payer: MEDICARE | Attending: Family Medicine | Primary: Family Medicine

## 2014-10-03 DIAGNOSIS — J019 Acute sinusitis, unspecified: Secondary | ICD-10-CM

## 2014-10-03 MED ORDER — CEFDINIR 300 MG PO CAPS
300 MG | ORAL_CAPSULE | Freq: Two times a day (BID) | ORAL | Status: AC
Start: 2014-10-03 — End: 2014-10-13

## 2014-10-03 MED ORDER — PROMETHAZINE-DM 6.25-15 MG/5ML PO SYRP
Freq: Four times a day (QID) | ORAL | Status: DC | PRN
Start: 2014-10-03 — End: 2014-10-16

## 2014-10-03 MED ORDER — CEFTRIAXONE SODIUM 1 G IJ SOLR
1 g | Freq: Once | INTRAMUSCULAR | Status: AC
Start: 2014-10-03 — End: 2014-10-03
  Administered 2014-10-03: 15:00:00 1 g via INTRAMUSCULAR

## 2014-10-03 NOTE — Telephone Encounter (Signed)
Pt was on the schedule with Selena BattenKim today.  She wants to know if we could just call something in for her sxs of Sinus pain and pressure, ST, ears are itching,cough (sometimes productive), no headache, eyes are sensitive to light, not able to eat as much b/c she's not hungry but she is drinking,   no fever.   Sxs since Saturday.  Progressively getting worse.   OTC sinus med and Nyquil is not working.   Uses [facility].

## 2014-10-03 NOTE — Telephone Encounter (Signed)
Seen today

## 2014-10-03 NOTE — Progress Notes (Signed)
Subjective:      Patient ID: Lisa York is a 63 y.o. female.  Chief Complaint   Patient presents with   ??? Head Congestion   ??? Pharyngitis     Pt states she has also had watery eyes and sneezing x 1 week.  Sinusitis  This is a new problem. The current episode started in the past 7 days. The problem has been gradually worsening since onset. There has been no fever (felt hot, chills). Associated symptoms include chills, congestion, coughing, ear pain, sinus pressure and a sore throat. Pertinent negatives include no shortness of breath. Past treatments include acetaminophen and oral decongestants. The treatment provided no relief.       Review of Systems   Constitutional: Positive for chills. Negative for fever.   HENT: Positive for congestion, ear pain, sinus pressure and sore throat.    Respiratory: Positive for cough. Negative for shortness of breath.    Gastrointestinal: Negative for vomiting and diarrhea.       Objective:   Physical Exam   Constitutional: She is oriented to person, place, and time. She appears well-developed and well-nourished.   HENT:   Head: Normocephalic and atraumatic.   Right Ear: External ear normal. A middle ear effusion is present.   Left Ear: External ear normal. A middle ear effusion is present.   Nose: Right sinus exhibits maxillary sinus tenderness and frontal sinus tenderness. Left sinus exhibits maxillary sinus tenderness and frontal sinus tenderness.   Mouth/Throat: Oropharynx is clear and moist.   Eyes: Conjunctivae and EOM are normal.   Cardiovascular: Normal rate, regular rhythm and normal heart sounds.    Pulmonary/Chest: Effort normal and breath sounds normal.   Abdominal: Soft. There is no tenderness.   Neurological: She is alert and oriented to person, place, and time.   Psychiatric: She has a normal mood and affect.     BP 126/76 mmHg   Pulse 90   Temp(Src) 98.5 ??F (36.9 ??C) (Tympanic)   Resp 18   Ht 5\' 7"  (1.702 m)   Wt 181 lb 9.6 oz (82.373 kg)   BMI 28.44 kg/m2   SpO2  96%   Assessment/Plan   1. Acute sinusitis, recurrence not specified, unspecified location  inj and abx today.  Call if not better.    - cefdinir (OMNICEF) 300 MG capsule; Take 1 capsule by mouth 2 times daily for 10 days  Dispense: 20 capsule; Refill: 0  - promethazine-dextromethorphan (PROMETHAZINE-DM) 6.25-15 MG/5ML syrup; Take 5 mLs by mouth 4 times daily as needed for Cough  Dispense: 120 mL; Refill: 0  - cefTRIAXone (ROCEPHIN) injection 1 g; Inject 1 g into the muscle once    2. Tobacco use  Discussed need for cessation

## 2014-10-03 NOTE — Patient Instructions (Addendum)
Stopping Smoking: Care Instructions  Your Care Instructions  Cigarette smokers crave the nicotine in cigarettes. Giving it up is much harder than simply changing a habit. Your body has to stop craving the nicotine. It is hard to quit, but you can do it. There are many tools that people use to quit smoking. You may find that combining tools works best for you.  There are several steps to quitting. First you get ready to quit. Then you get support to help you. After that, you learn new skills and behaviors to become a nonsmoker. For many people, a necessary step is getting and using medicine.  Your doctor will help you set up the plan that best meets your needs. You may want to attend a smoking cessation program to help you quit smoking. When you choose a program, look for one that has proven success. Ask your doctor for ideas. You will greatly increase your chances of success if you take medicine as well as get counseling or join a cessation program.  Some of the changes you feel when you first quit tobacco are uncomfortable. Your body will miss the nicotine at first, and you may feel short-tempered and grumpy. You may have trouble sleeping or concentrating. Medicine can help you deal with these symptoms. You may struggle with changing your smoking habits and rituals. The last step is the tricky one: Be prepared for the smoking urge to continue for a time. This is a lot to deal with, but keep at it. You will feel better.  Follow-up care is a key part of your treatment and safety. Be sure to make and go to all appointments, and call your doctor if you are having problems. It???s also a good idea to know your test results and keep a list of the medicines you take.  How can you care for yourself at home?  ?? Ask your family, friends, and coworkers for support. You have a better chance of quitting if you have help and support.  ?? Join a support group, such as Nicotine Anonymous, for people who are trying to quit  smoking.  ?? Consider signing up for a smoking cessation program, such as the American [employer] Association's Freedom from Smoking program.  ?? Set a quit date. Pick your date carefully so that it is not right in the middle of a big deadline or stressful time. Once you quit, do not even take a puff. Get rid of all ashtrays and lighters after your last cigarette. Clean your house and your clothes so that they do not smell of smoke.  ?? Learn how to be a nonsmoker. Think about ways you can avoid those things that make you reach for a cigarette.  ?? Avoid situations that put you at greatest risk for smoking. For some people, it is hard to have a drink with friends without smoking. For others, they might skip a coffee break with coworkers who smoke.  ?? Change your daily routine. Take a different route to work or eat a meal in a different place.  ?? Cut down on stress. Calm yourself or release tension by doing an activity you enjoy, such as reading a book, taking a hot bath, or gardening.  ?? Talk to your doctor or pharmacist about nicotine replacement therapy, which replaces the nicotine in your body. You still get nicotine but you do not use tobacco. Nicotine replacement products help you slowly reduce the amount of nicotine you need. These products come in several forms,   many of them available over-the-counter:  ?? Nicotine patches  ?? Nicotine gum and lozenges  ?? Nicotine inhaler  ?? Ask your doctor about bupropion (Wellbutrin) or varenicline (Chantix), which are prescription medicines. They do not contain nicotine. They help you by reducing withdrawal symptoms, such as stress and anxiety.  ?? Some people find hypnosis, acupuncture, and massage helpful for ending the smoking habit.  ?? Eat a healthy diet and get regular exercise. Having healthy habits will help your body move past its craving for nicotine.  ?? Be prepared to keep trying. Most people are not successful the first few times they try to quit. Do not get mad at yourself  if you smoke again. Make a list of things you learned and think about when you want to try again, such as next week, next month, or next year.   Where can you learn more?   Go to [email or web address] and sign in to your MyChart account. Enter Y522 in the Search Health Information box to learn more about ???Stopping Smoking: Care Instructions.???    If you do not have an account, please click on the ???Sign Up Now??? link.     ?? 2006-2015 [employer]. Care instructions adapted under license by [facility]. This care instruction is for use with your licensed healthcare professional. If you have questions about a medical condition or this instruction, always ask your healthcare professional. [employer], Inc[employer] disclaims any warranty or liability for your use of this information.  Content Version: 10.6.465758; Current as of: September 05, 2013              Stopping Smoking: Care Instructions  Your Care Instructions  Cigarette smokers crave the nicotine in cigarettes. Giving it up is much harder than simply changing a habit. Your body has to stop craving the nicotine. It is hard to quit, but you can do it. There are many tools that people use to quit smoking. You may find that combining tools works best for you.  There are several steps to quitting. First you get ready to quit. Then you get support to help you. After that, you learn new skills and behaviors to become a nonsmoker. For many people, a necessary step is getting and using medicine.  Your doctor will help you set up the plan that best meets your needs. You may want to attend a smoking cessation program to help you quit smoking. When you choose a program, look for one that has proven success. Ask your doctor for ideas. You will greatly increase your chances of success if you take medicine as well as get counseling or join a cessation program.  Some of the changes you feel when you first quit tobacco are uncomfortable. Your body  will miss the nicotine at first, and you may feel short-tempered and grumpy. You may have trouble sleeping or concentrating. Medicine can help you deal with these symptoms. You may struggle with changing your smoking habits and rituals. The last step is the tricky one: Be prepared for the smoking urge to continue for a time. This is a lot to deal with, but keep at it. You will feel better.  Follow-up care is a key part of your treatment and safety. Be sure to make and go to all appointments, and call your doctor if you are having problems. It???s also a good idea to know your test results and keep a list of the medicines you take.  How can you care for yourself at home?  ??   Ask your family, friends, and coworkers for support. You have a better chance of quitting if you have help and support.  ?? Join a support group, such as Nicotine Anonymous, for people who are trying to quit smoking.  ?? Consider signing up for a smoking cessation program, such as the American Lung Association's Freedom from Smoking program.  ?? Set a quit date. Pick your date carefully so that it is not right in the middle of a big deadline or stressful time. Once you quit, do not even take a puff. Get rid of all ashtrays and lighters after your last cigarette. Clean your house and your clothes so that they do not smell of smoke.  ?? Learn how to be a nonsmoker. Think about ways you can avoid those things that make you reach for a cigarette.  ?? Avoid situations that put you at greatest risk for smoking. For some people, it is hard to have a drink with friends without smoking. For others, they might skip a coffee break with coworkers who smoke.  ?? Change your daily routine. Take a different route to work or eat a meal in a different place.  ?? Cut down on stress. Calm yourself or release tension by doing an activity you enjoy, such as reading a book, taking a hot bath, or gardening.  ?? Talk to your doctor or pharmacist about nicotine replacement therapy,  which replaces the nicotine in your body. You still get nicotine but you do not use tobacco. Nicotine replacement products help you slowly reduce the amount of nicotine you need. These products come in several forms, many of them available over-the-counter:  ?? Nicotine patches  ?? Nicotine gum and lozenges  ?? Nicotine inhaler  ?? Ask your doctor about bupropion (Wellbutrin) or varenicline (Chantix), which are prescription medicines. They do not contain nicotine. They help you by reducing withdrawal symptoms, such as stress and anxiety.  ?? Some people find hypnosis, acupuncture, and massage helpful for ending the smoking habit.  ?? Eat a healthy diet and get regular exercise. Having healthy habits will help your body move past its craving for nicotine.  ?? Be prepared to keep trying. Most people are not successful the first few times they try to quit. Do not get mad at yourself if you smoke again. Make a list of things you learned and think about when you want to try again, such as next week, next month, or next year.   Where can you learn more?   Go to [email or web address] and sign in to your MyChart account. Enter Y522 in the Search Health Information box to learn more about ???Stopping Smoking: Care Instructions.???    If you do not have an account, please click on the ???Sign Up Now??? link.     ?? 2006-2015 [employer], [employer]. Care instructions adapted under license by [facility]. This care instruction is for use with your licensed healthcare professional. If you have questions about a medical condition or this instruction, always ask your healthcare professional. [employer] disclaims any warranty or liability for your use of this information.  Content Version: 10.6.465758; Current as of: September 05, 2013

## 2014-10-16 ENCOUNTER — Telehealth

## 2014-10-16 MED ORDER — PROMETHAZINE-DM 6.25-15 MG/5ML PO SYRP
Freq: Four times a day (QID) | ORAL | Status: AC | PRN
Start: 2014-10-16 — End: 2014-10-23

## 2014-10-16 MED ORDER — TRIAMCINOLONE ACETONIDE 0.1 % EX OINT
0.1 % | Freq: Two times a day (BID) | CUTANEOUS | Status: AC
Start: 2014-10-16 — End: 2014-10-30

## 2014-10-16 MED ORDER — PAROXETINE HCL 40 MG PO TABS
40 MG | ORAL_TABLET | Freq: Every day | ORAL | Status: DC
Start: 2014-10-16 — End: 2015-04-29

## 2014-10-16 MED ORDER — PNEUMOCOCCAL 13-VAL CONJ VACC IM SUSP
Freq: Once | INTRAMUSCULAR | Status: DC
Start: 2014-10-16 — End: 2014-10-16

## 2014-10-16 MED ORDER — OMEPRAZOLE 40 MG PO CPDR
40 MG | ORAL_CAPSULE | Freq: Every day | ORAL | Status: DC
Start: 2014-10-16 — End: 2015-04-29

## 2014-10-16 MED ORDER — ZOLPIDEM TARTRATE 5 MG PO TABS
5 MG | ORAL_TABLET | Freq: Every evening | ORAL | Status: AC | PRN
Start: 2014-10-16 — End: 2014-11-15

## 2014-10-16 MED ORDER — ATORVASTATIN CALCIUM 10 MG PO TABS
10 MG | ORAL_TABLET | Freq: Every day | ORAL | Status: DC
Start: 2014-10-16 — End: 2015-04-29

## 2014-10-16 MED ORDER — HYDROCODONE-ACETAMINOPHEN 10-325 MG PO TABS
10-325 MG | ORAL_TABLET | Freq: Four times a day (QID) | ORAL | Status: DC | PRN
Start: 2014-10-16 — End: 2014-11-13

## 2014-10-16 NOTE — Telephone Encounter (Signed)
I sent cough med to pharm.

## 2014-10-16 NOTE — Telephone Encounter (Signed)
Pt called requesting a cough [facility] to be [facility]

## 2014-10-16 NOTE — Progress Notes (Signed)
Subjective:      Patient ID: Lisa FordyceLinda S York is a 63 y.o. female.    Cough  This is a recurrent problem. The current episode started 1 to 4 weeks ago (2 to 3 weeks ago). The problem has been gradually improving (but not completely resolved). The cough is productive of sputum (productive cough and nasal drainage of clear phlegm). Associated symptoms include nasal congestion, a rash (left upper arm only), rhinorrhea, shortness of breath, sweats and wheezing. Pertinent negatives include no chest pain, chills, ear pain, fever, headaches or sore throat. Risk factors for lung disease include smoking/tobacco exposure. Treatments tried: Pt saw Dr Orvan Falconerampbell and was treated with antibiotics, her symptoms have improved but are not resolved. The treatment provided significant relief. Her past medical history is significant for environmental allergies.   Insomnia  Pt's prescription coverage has changed and [employer] will not cover temazepam, she needs to discuss getting a different rx.  Itching  Pt presents with itching on left upper arm, there is no apparent rash.  Pt states that she gets this every year in the fall and it is always in only that area, she has been seen by multiple providers in the past and they have treated the itching, but have not diagnosed what causes it.      Review of Systems   Constitutional: Negative for fever and chills.   HENT: Positive for rhinorrhea. Negative for congestion, ear pain, sinus pressure, sore throat and voice change.    Eyes: Negative.    Respiratory: Positive for cough, shortness of breath and wheezing.    Cardiovascular: Negative.  Negative for chest pain, palpitations and leg swelling.   Gastrointestinal: Negative.    Endocrine: Negative.    Musculoskeletal: Positive for back pain and arthralgias (left knee pain).   Skin: Positive for rash (left upper arm only).   Allergic/Immunologic: Positive for environmental allergies.   Neurological: Negative for dizziness and headaches.   Hematological:  Negative.    Psychiatric/Behavioral: Positive for sleep disturbance.       Objective:   Physical Exam   Constitutional: She is oriented to person, place, and time. She appears well-developed and well-nourished.   HENT:   Head: Normocephalic and atraumatic.   Right Ear: Hearing, tympanic membrane, external ear and ear canal normal.   Left Ear: Hearing, tympanic membrane, external ear and ear canal normal.   Nose: Right sinus exhibits maxillary sinus tenderness (mild bilat). Right sinus exhibits no frontal sinus tenderness. Left sinus exhibits maxillary sinus tenderness. Left sinus exhibits no frontal sinus tenderness.   Mouth/Throat: Uvula is midline, oropharynx is clear and moist and mucous membranes are normal.   Eyes: Conjunctivae and EOM are normal. Pupils are equal, round, and reactive to light.   Neck: Normal range of motion. Neck supple.   Cardiovascular: Normal rate, regular rhythm, S1 normal, S2 normal, normal heart sounds, intact distal pulses and normal pulses.  Exam reveals no gallop.    No murmur heard.  Pulmonary/Chest: Effort normal and breath sounds normal. No accessory muscle usage. No respiratory distress. She has no decreased breath sounds. She has no wheezes. She has no rhonchi. She has no rales.   Abdominal: Soft. Bowel sounds are normal.   Musculoskeletal: Normal range of motion.   Neurological: She is alert and oriented to person, place, and time.   Skin: Skin is warm, dry and intact. Rash (left upper arm - mild erythema and scratch marks) noted.   Psychiatric: She has a normal mood and affect.  Her speech is normal and behavior is normal.   Vitals reviewed.    BP 140/96 mmHg   Pulse 86   Temp(Src) 98.8 ??F (37.1 ??C) (Tympanic)   Ht 5\' 7"  (1.702 m)   Wt 185 lb (83.915 kg)   BMI 28.97 kg/m2   SpO2 96%    Assessment/Plan:       Lisa York was seen today for cough and insomnia.    Diagnoses and associated orders for this visit:    Anxiety: stable. Refill as below.   - PARoxetine (PAXIL) 40 MG tablet;  Take 1 tablet by mouth daily    Cough: Patient reports intermittent cough however reports significantly better since treated with round of antibiotics. Patient states she just wanted to make sure she didn't need any more antibiotics because she still is coughing some. Lungs CTA. Patient continues to smoke - urged smoking cessation and patient reports she has cut back significantly. Encouraged adequate hydration, use of albuterol prn for wheezing and Patient to f/u if no better or worsening of symptoms.    Gastroesophageal reflux disease without esophagitis: stable.   - omeprazole (PRILOSEC) 40 MG capsule; Take 1 capsule by mouth daily    Hyperlipidemia: Patient not fasting. Plan to repeat fasting lipids with next visit.   - atorvastatin (LIPITOR) 10 MG tablet; Take 1 tablet by mouth daily    Rash: left upper arm with scratch marks. Will tx. As below. Patient to f/u if no better or worsening of symptoms.  - triamcinolone (KENALOG) 0.1 % ointment; Apply topically 2 times daily for 14 days    Primary insomnia: reports restoril not covered by insurance and too expensive. Patient reports inability to fall asleep and stay asleep. Discussed trying ambien as below. Patient to f/u if no better or worsening of symptoms.  - zolpidem (AMBIEN) 5 MG tablet; Take 1 tablet by mouth nightly as needed for Sleep    Chronic joint pain: take prn for chronic back and left knee pain.   - HYDROcodone-acetaminophen (NORCO) 10-325 MG per tablet; Take 1 tablet by mouth every 6 hours as needed for Pain    History of pneumonia  - Pneumococcal conjugate vaccine 13-valent IM (PREVNAR 13)    Need for pneumococcal vaccination  - Pneumococcal conjugate vaccine 13-valent IM (PREVNAR 13)

## 2014-10-18 ENCOUNTER — Encounter: Attending: Family Medicine | Primary: Family Medicine

## 2014-11-13 ENCOUNTER — Encounter

## 2014-11-14 MED ORDER — HYDROCODONE-ACETAMINOPHEN 10-325 MG PO TABS
10-325 MG | ORAL_TABLET | Freq: Four times a day (QID) | ORAL | Status: DC | PRN
Start: 2014-11-14 — End: 2014-12-13

## 2014-12-13 ENCOUNTER — Encounter

## 2014-12-13 MED ORDER — HYDROCODONE-ACETAMINOPHEN 10-325 MG PO TABS
10-325 MG | ORAL_TABLET | Freq: Four times a day (QID) | ORAL | Status: DC | PRN
Start: 2014-12-13 — End: 2015-01-14

## 2015-01-14 ENCOUNTER — Encounter

## 2015-01-14 MED ORDER — HYDROCODONE-ACETAMINOPHEN 10-325 MG PO TABS
10-325 MG | ORAL_TABLET | Freq: Four times a day (QID) | ORAL | Status: DC | PRN
Start: 2015-01-14 — End: 2015-02-11

## 2015-01-14 NOTE — Telephone Encounter (Signed)
Controlled Substances Monitoring: Attestation: The Prescription Monitoring Report for this patient was reviewed today. (Shirleyann Montero D Zayden Hahne, CNP)  Documentation: No signs of potential drug abuse or diversion identified. (Kameelah Minish D Claryce Friel, CNP)

## 2015-01-15 ENCOUNTER — Ambulatory Visit: Admit: 2015-01-15 | Discharge: 2015-01-15 | Payer: MEDICARE | Attending: Family | Primary: Family Medicine

## 2015-01-15 DIAGNOSIS — G8929 Other chronic pain: Secondary | ICD-10-CM

## 2015-01-15 MED ORDER — ZOLPIDEM TARTRATE 5 MG PO TABS
5 MG | ORAL_TABLET | ORAL | Status: DC
Start: 2015-01-15 — End: 2015-04-29

## 2015-01-15 NOTE — Patient Instructions (Signed)
Back Pain: Care Instructions  Your Care Instructions     Back pain has many possible causes. It is often related to problems with muscles and ligaments of the back. It may also be related to problems with the nerves, discs, or bones of the back. Moving, lifting, standing, sitting, or sleeping in an awkward way can strain the back. Sometimes you don't notice the injury until later. Arthritis is another common cause of back pain.  Although it may hurt a lot, back pain usually improves on its own within several weeks. Most people recover in 12 weeks or less. Using good home treatment and being careful not to stress your back can help you feel better sooner.  Follow-up care is a key part of your treatment and safety. Be sure to make and go to all appointments, and call your doctor if you are having problems. It???s also a good idea to know your test results and keep a list of the medicines you take.  How can you care for yourself at home?  ?? Sit or lie in positions that are most comfortable and reduce your pain. Try one of these positions when you lie down:  ?? Lie on your back with your knees bent and supported by large pillows.  ?? Lie on the floor with your legs on the seat of a sofa or chair.  ?? Lie on your side with your knees and hips bent and a pillow between your legs.  ?? Lie on your stomach if it does not make pain worse.  ?? Do not sit up in bed, and avoid soft couches and twisted positions. Bed rest can help relieve pain at first, but it delays healing. Avoid bed rest after the first day of back pain.  ?? Change positions every 30 minutes. If you must sit for long periods of time, take breaks from sitting. Get up and walk around, or lie in a comfortable position.  ?? Try using a heating pad on a low or medium setting for 15 to 20 minutes every 2 or 3 hours. Try a warm shower in place of one session with the heating pad.  ?? You can also try an ice pack for 10 to 15 minutes every 2 to 3 hours. Put a thin cloth  between the ice pack and your skin.  ?? Take pain medicines exactly as directed.  ?? If the doctor gave you a prescription medicine for pain, take it as prescribed.  ?? If you are not taking a prescription pain medicine, ask your doctor if you can take an over-the-counter medicine.  ?? Take short walks several times a day. You can start with 5 to 10 minutes, 3 or 4 times a day, and work up to longer walks. Walk on level surfaces and avoid hills and stairs until your back is better.  ?? Return to work and other activities as soon as you can. Continued rest without activity is usually not good for your back.  ?? To prevent future back pain, do exercises to stretch and strengthen your back and stomach. Learn how to use good posture, safe lifting techniques, and proper body mechanics.  When should you call for help?  Call your doctor now or seek immediate medical care if:  ?? You have new or worsening numbness in your legs.  ?? You have new or worsening weakness in your legs. (This could make it hard to stand up.)  ?? You lose control of your bladder or bowels.    Watch closely for changes in your health, and be sure to contact your doctor if:  ?? Your pain gets worse.  ?? You are not getting better after 2 weeks.   Where can you learn more?   Go to [email or web address] and sign in to your MyChart account. Enter [id number] in the Search Health Information box to learn more about ???Back Pain: Care Instructions.???    If you do not have an account, please click on the ???Sign Up Now??? link.     ?? 2006-2015 [employer]. Care instructions adapted under license by [facility]. This care instruction is for use with your licensed healthcare professional. If you have questions about a medical condition or this instruction, always ask your healthcare professional. [employer], Inc[employer] disclaims any warranty or liability for your use of this information.  Content Version: 10.6.465758; Current as of: May 18, 2014        Anxiety Disorder: Care Instructions  Your Care Instructions  Anxiety is a normal reaction to stress. Difficult situations can cause you to have symptoms such as sweaty palms and a nervous feeling.  In an anxiety disorder, the symptoms are far more severe. Constant worry, muscle tension, trouble sleeping, nausea and diarrhea, and other symptoms can make normal daily activities difficult or impossible. These symptoms may occur for no reason, and they can affect your work, school, or social life. Medicines, counseling, and self-care can all help.  Follow-up care is a key part of your treatment and safety. Be sure to make and go to all appointments, and call your doctor if you are having problems. It's also a good idea to know your test results and keep a list of the medicines you take.  How can you care for yourself at home?  ?? Take medicines exactly as directed. Call your doctor if you think you are having a problem with your medicine.  ?? Go to your counseling sessions and follow-up appointments.  ?? Recognize and accept your anxiety. Then, when you are in a situation that makes you anxious, say to yourself, "This is not an emergency. I feel uncomfortable, but I am not in danger. I can keep going even if I feel anxious."  ?? Be kind to your body:  ?? Relieve tension with exercise or a massage.  ?? Get enough rest.  ?? Avoid alcohol, caffeine, nicotine, and illegal drugs. They can increase your anxiety level and cause sleep problems.  ?? Learn and do relaxation techniques. See below for more about these techniques.  ?? Engage your mind. Get out and do something you enjoy. Go to a funny movie, or take a walk or hike. Plan your day. Having too much or too little to do can make you anxious.  ?? Keep a record of your symptoms. Discuss your fears with a good friend or family member, or join a support group for people with similar problems. Talking to others sometimes relieves stress.  ?? Get involved in social groups, or  volunteer to help others. Being alone sometimes makes things seem worse than they are.  ?? Get at least 30 minutes of exercise on most days of the week to relieve stress. Walking is a good choice. You also may want to do other activities, such as running, swimming, cycling, or playing tennis or team sports.  Relaxation techniques  Do relaxation exercises 10 to 20 minutes a day. You can play soothing, relaxing music while you do them, if you wish.  ?? Tell others in  your house that you are going to do your relaxation exercises. Ask them not to disturb you.  ?? Find a comfortable place, away from all distractions and noise.  ?? Lie down on your back, or sit with your back straight.  ?? Focus on your breathing. Make it slow and steady.  ?? Breathe in through your nose. Breathe out through either your nose or mouth.  ?? Breathe deeply, filling up the area between your navel and your rib cage. Breathe so that your belly goes up and down.  ?? Do not hold your breath.  ?? Breathe like this for 5 to 10 minutes. Notice the feeling of calmness throughout your whole body.  As you continue to breathe slowly and deeply, relax by doing the following for another 5 to 10 minutes:  ?? Tighten and relax each muscle group in your body. You can begin at your toes and work your way up to your head.  ?? Imagine your muscle groups relaxing and becoming heavy.  ?? Empty your mind of all thoughts.  ?? Let yourself relax more and more deeply.  ?? Become aware of the state of calmness that surrounds you.  ?? When your relaxation time is over, you can bring yourself back to alertness by moving your fingers and toes and then your hands and feet and then stretching and moving your entire body. Sometimes people fall asleep during relaxation, but they usually wake up shortly afterward.  ?? Always give yourself time to return to full alertness before you drive a car or do anything that might cause an accident if you are not fully alert. Never play a relaxation  tape while you drive a car.  When should you call for help?  Call 911 anytime you think you may need emergency care. For example, call if:  ?? You feel you cannot stop from hurting yourself or someone else.  Watch closely for changes in your health, and be sure to contact your doctor if:  ?? You have anxiety or fear that affects your life.  ?? You have symptoms of anxiety that are new or different from those you had before.   Where can you learn more?   Go to [email or web address] and sign in to your MyChart account. Enter P754 in the Search Health Information box to learn more about ???Anxiety Disorder: Care Instructions.???    If you do not have an account, please click on the ???Sign Up Now??? link.     ?? 2006-2015 [employer]. Care instructions adapted under license by [facility]. This care instruction is for use with your licensed healthcare professional. If you have questions about a medical condition or this instruction, always ask your healthcare professional. [employer], Inc[employer] disclaims any warranty or liability for your use of this information.  Content Version: 10.6.465758; Current as of: November 10, 2013

## 2015-01-15 NOTE — Progress Notes (Signed)
Subjective:      Patient ID: Lisa FordyceLinda S York is a 64 y.o. female.    HPI  Chief Complaint   Patient presents with   ??? Medication Refill     Pt presents for routine check up and refill of her norco.     Subjective:       Lisa FordyceLinda S York is a 64 y.o. female who presents for follow up of anxiety disorder. She has the following anxiety symptoms: none, when taking paxil. Onset of symptoms was approximately several years ago. Symptoms have been stable since that time. She denies current suicidal and homicidal ideation. Family history significant for anxiety and depression. Risk factors: none. Previous treatment includes Paxil. She complains of the following medication side effects: none.   Chronic Back Pain: Pain is unchanged. On average, pain is perceived as mild (1-3  pain scale).  Change in quality of symptoms: no.  Associated symptoms: none.  She denies any other symptoms.  Current treatment: ice, heat, norco.  Medication side effects: none. Recent diagnostic testing: none.      Review of Systems   Constitutional: Negative for fever, chills and appetite change.   HENT: Negative.  Negative for congestion, ear pain and sinus pressure.    Eyes: Negative.    Respiratory: Negative for cough, chest tightness and shortness of breath.    Cardiovascular: Negative for chest pain, palpitations and leg swelling.   Gastrointestinal: Negative.  Negative for abdominal pain, diarrhea and constipation.   Genitourinary: Negative.    Musculoskeletal: Positive for back pain and arthralgias (right knee, chronic, better after joint injection).   Skin: Positive for rash (itchy area to left shoulder, chronic).   Neurological: Negative for dizziness and headaches.   Psychiatric/Behavioral: Negative for sleep disturbance.       Objective:   Physical Exam   Constitutional: She is oriented to person, place, and time. She appears well-developed and well-nourished.   HENT:   Head: Normocephalic and atraumatic.   Eyes: Conjunctivae and EOM are normal.  Pupils are equal, round, and reactive to light.   Neck: Normal range of motion. Neck supple.   Cardiovascular: Normal rate, regular rhythm and normal heart sounds.    Pulmonary/Chest: Effort normal and breath sounds normal.   Abdominal: Soft. Bowel sounds are normal.   Musculoskeletal: Normal range of motion.   Neurological: She is alert and oriented to person, place, and time.   Skin: Skin is warm and dry.   Psychiatric: She has a normal mood and affect.   Vitals reviewed.    BP 118/72 mmHg   Pulse 82   Temp(Src) 98.2 ??F (36.8 ??C) (Tympanic)   Ht 5\' 7"  (1.702 m)   Wt 184 lb (83.462 kg)   BMI 28.81 kg/m2   SpO2 96%    Assessment:      Lisa York was seen today for medication refill.    Diagnoses and associated orders for this visit:    Chronic low back pain: well controlled with current meds. refill provided yesterday. OARRS updated yesterday.     Anxiety: symptoms are well controlled with current meds.

## 2015-01-24 LAB — PATHOLOGY REPORT
Pathology Report: 1275
Pathology Report: 1275

## 2015-02-11 ENCOUNTER — Encounter

## 2015-02-11 MED ORDER — HYDROCODONE-ACETAMINOPHEN 10-325 MG PO TABS
10-325 MG | ORAL_TABLET | Freq: Four times a day (QID) | ORAL | Status: DC | PRN
Start: 2015-02-11 — End: 2015-03-11

## 2015-03-11 ENCOUNTER — Encounter

## 2015-03-11 MED ORDER — HYDROCODONE-ACETAMINOPHEN 10-325 MG PO TABS
10-325 MG | ORAL_TABLET | Freq: Four times a day (QID) | ORAL | Status: DC | PRN
Start: 2015-03-11 — End: 2015-04-08

## 2015-04-08 ENCOUNTER — Encounter

## 2015-04-08 MED ORDER — HYDROCODONE-ACETAMINOPHEN 10-325 MG PO TABS
10-325 MG | ORAL_TABLET | Freq: Four times a day (QID) | ORAL | Status: DC | PRN
Start: 2015-04-08 — End: 2015-04-29

## 2015-04-08 NOTE — Telephone Encounter (Signed)
Oarrs ran

## 2015-04-08 NOTE — Telephone Encounter (Signed)
Need oarrs

## 2015-04-29 ENCOUNTER — Ambulatory Visit: Admit: 2015-04-29 | Discharge: 2015-04-29 | Payer: MEDICARE | Attending: Family | Primary: Family Medicine

## 2015-04-29 DIAGNOSIS — F419 Anxiety disorder, unspecified: Secondary | ICD-10-CM

## 2015-04-29 MED ORDER — OMEPRAZOLE 40 MG PO CPDR
40 MG | ORAL_CAPSULE | Freq: Every day | ORAL | Status: DC
Start: 2015-04-29 — End: 2015-11-05

## 2015-04-29 MED ORDER — PAROXETINE HCL 40 MG PO TABS
40 MG | ORAL_TABLET | Freq: Every day | ORAL | Status: DC
Start: 2015-04-29 — End: 2015-11-05

## 2015-04-29 MED ORDER — ZOLPIDEM TARTRATE 5 MG PO TABS
5 MG | ORAL_TABLET | Freq: Every evening | ORAL | Status: DC | PRN
Start: 2015-04-29 — End: 2015-09-10

## 2015-04-29 MED ORDER — HYDROCODONE-ACETAMINOPHEN 10-325 MG PO TABS
10-325 MG | ORAL_TABLET | Freq: Four times a day (QID) | ORAL | Status: DC | PRN
Start: 2015-04-29 — End: 2015-06-05

## 2015-04-29 NOTE — Patient Instructions (Signed)
Back Pain: Care Instructions  Your Care Instructions     Back pain has many possible causes. It is often related to problems with muscles and ligaments of the back. It may also be related to problems with the nerves, discs, or bones of the back. Moving, lifting, standing, sitting, or sleeping in an awkward way can strain the back. Sometimes you don't notice the injury until later. Arthritis is another common cause of back pain.  Although it may hurt a lot, back pain usually improves on its own within several weeks. Most people recover in 12 weeks or less. Using good home treatment and being careful not to stress your back can help you feel better sooner.  Follow-up care is a key part of your treatment and safety. Be sure to make and go to all appointments, and call your doctor if you are having problems. It???s also a good idea to know your test results and keep a list of the medicines you take.  How can you care for yourself at home?  ?? Sit or lie in positions that are most comfortable and reduce your pain. Try one of these positions when you lie down:  ?? Lie on your back with your knees bent and supported by large pillows.  ?? Lie on the floor with your legs on the seat of a sofa or chair.  ?? Lie on your side with your knees and hips bent and a pillow between your legs.  ?? Lie on your stomach if it does not make pain worse.  ?? Do not sit up in bed, and avoid soft couches and twisted positions. Bed rest can help relieve pain at first, but it delays healing. Avoid bed rest after the first day of back pain.  ?? Change positions every 30 minutes. If you must sit for long periods of time, take breaks from sitting. Get up and walk around, or lie in a comfortable position.  ?? Try using a heating pad on a low or medium setting for 15 to 20 minutes every 2 or 3 hours. Try a warm shower in place of one session with the heating pad.  ?? You can also try an ice pack for 10 to 15 minutes every 2 to 3 hours. Put a thin cloth  between the ice pack and your skin.  ?? Take pain medicines exactly as directed.  ?? If the doctor gave you a prescription medicine for pain, take it as prescribed.  ?? If you are not taking a prescription pain medicine, ask your doctor if you can take an over-the-counter medicine.  ?? Take short walks several times a day. You can start with 5 to 10 minutes, 3 or 4 times a day, and work up to longer walks. Walk on level surfaces and avoid hills and stairs until your back is better.  ?? Return to work and other activities as soon as you can. Continued rest without activity is usually not good for your back.  ?? To prevent future back pain, do exercises to stretch and strengthen your back and stomach. Learn how to use good posture, safe lifting techniques, and proper body mechanics.  When should you call for help?  Call your doctor now or seek immediate medical care if:  ?? You have new or worsening numbness in your legs.  ?? You have new or worsening weakness in your legs. (This could make it hard to stand up.)  ?? You lose control of your bladder or bowels.    Watch closely for changes in your health, and be sure to contact your doctor if:  ?? Your pain gets worse.  ?? You are not getting better after 2 weeks.   Where can you learn more?   Go to [email or web address] and sign in to your MyChart account. Enter [id number] in the Search Health Information box to learn more about ???Back Pain: Care Instructions.???    If you do not have an account, please click on the ???Sign Up Now??? link.     ?? 2006-2015 [employer], [employer]. Care instructions adapted under license by [facility]. This care instruction is for use with your licensed healthcare professional. If you have questions about a medical condition or this instruction, always ask your healthcare professional. [employer], [employer] disclaims any warranty or liability for your use of this information.  Content Version: 10.6.465758; Current as of: May 18, 2014              Purine-Restricted Diet: Care Instructions  Your Care Instructions  Purines are substances that are found in some foods. Your body turns purines into uric acid. High levels of uric acid can cause gout, which is a form of arthritis that causes pain and inflammation in joints.  You may be able to help control the amount of uric acid in your body by limiting high-purine foods in your diet.  Follow-up care is a key part of your treatment and safety. Be sure to make and go to all appointments, and call your doctor if you are having problems. It's also a good idea to know your test results and keep a list of the medicines you take.  How can you care for yourself at home?  ?? Plan your meals and snacks around foods that are low in purines and are safe for you to eat. These foods include:  ?? Green vegetables and tomatoes.  ?? Fruits.  ?? Whole-grain breads, Rumpf, and cereals.  ?? Eggs, peanut butter, and nuts.  ?? Low-fat milk, cheese, and other milk products.  ?? Popcorn.  ?? Gelatin desserts, chocolate, cocoa, and cakes and sweets, in small amounts.  ?? You can eat certain foods that are medium-high in purines, but eat them only once in a while. These foods include:  ?? Legumes, such as dried beans and dried peas. You can have 1 cup cooked legumes each day.  ?? Asparagus, cauliflower, spinach, mushrooms, and green peas.  ?? Fish and seafood (other than very high-purine seafood).  ?? Oatmeal, wheat bran, and wheat germ.  ?? Limit very high-purine foods, including:  ?? Organ meats, such as liver, kidneys, sweetbreads, and brains.  ?? Meats, including bacon, beef, pork, and lamb.  ?? Game meats and any other meats in large amounts.  ?? Anchovies, sardines, herring, mackerel, and scallops.  ?? Gravy.  ?? Beer.   Where can you learn more?   Go to [email or web address] and sign in to your MyChart account. Enter F448 in the Search Health Information box to learn more about ???Purine-Restricted Diet: Care  Instructions.???    If you do not have an account, please click on the ???Sign Up Now??? link.     ?? 2006-2015 [employer], [employer]. Care instructions adapted under license by [facility]. This care instruction is for use with your licensed healthcare professional. If you have questions about a medical condition or this instruction, always ask your healthcare professional. [employer], Inc[employer] disclaims any warranty or liability for your use of this information.  Content Version: 10.6.465758; Current as  of: November 10, 2013                Gout: Care Instructions  Your Care Instructions  Gout is a form of arthritis caused by a buildup of uric acid crystals in a joint. It causes sudden attacks of pain, swelling, redness, and stiffness, usually in one joint, especially the big toe.  Gout usually comes on without a cause. But it can be brought on by drinking alcohol (especially beer) or eating seafood and red meat. Taking certain medicines, such as diuretics or aspirin, also can bring on an attack of gout.  Taking your medicines as prescribed and following up with your doctor regularly can help you avoid gout attacks in the future.  Follow-up care is a key part of your treatment and safety. Be sure to make and go to all appointments, and call your doctor if you are having problems. It???s also a good idea to know your test results and keep a list of the medicines you take.  How can you care for yourself at home?  ?? If the joint is swollen, put ice or a cold pack on the area for 10 to 20 minutes at a time. Put a thin cloth between the ice and your skin.  ?? Prop up the sore limb on a pillow when you ice it or anytime you sit or lie down during the next 3 days. Try to keep it above the level of your heart. This will help reduce swelling.  ?? Rest sore joints. Avoid activities that put weight or strain on the joints for a few days. Take short rest breaks from your regular activities during the day.  ?? Take your medicines  exactly as prescribed. Call your doctor if you think you are having a problem with your medicine.  ?? Take pain medicines exactly as directed.  ?? If the doctor gave you a prescription medicine for pain, take it as prescribed.  ?? If you are not taking a prescription pain medicine, ask your doctor if you can take an over-the-counter medicine.  ?? Eat less seafood and red meat.  ?? Check with your doctor before drinking alcohol.  ?? Losing weight, if you are overweight, may help reduce attacks of gout. But do not go on a "crash diet." Losing a lot of weight in a short amount of time can cause a gout attack.  When should you call for help?  Call your doctor now or seek immediate medical care if:  ?? You have a fever.  ?? The joint is so painful you cannot use it.  ?? You have sudden, unexplained swelling, redness, warmth, or severe pain in one or more joints.  Watch closely for changes in your health, and be sure to contact your doctor if:  ?? You have joint pain.  ?? Your symptoms get worse or are not improving after 2 or 3 days.   Where can you learn more?   Go to [email or web address] and sign in to your MyChart account. Enter [id number] in the Search Health Information box to learn more about ???Gout: Care Instructions.???    If you do not have an account, please click on the ???Sign Up Now??? link.     ?? 2006-2015 [employer], [employer]. Care instructions adapted under license by [facility]. This care instruction is for use with your licensed healthcare professional. If you have questions about a medical condition or this instruction, always ask your healthcare professional. [employer] disclaims any warranty  or liability for your use of this information.  Content Version: 10.6.465758; Current as of: September 05, 2013

## 2015-04-29 NOTE — Progress Notes (Addendum)
Subjective:      Patient ID: Lisa York is a 64 y.o. female.  Chief Complaint   Patient presents with   ??? Foot Pain     rt big toe pain for about 1 week states it is feeling better now. no injury      HPI Comments: Subjective:    Lisa York is a 64 y.o. female who presents with right foot pain; great toe. Onset of the symptoms was 4 days ago. Precipitating event: none known. Current symptoms include: None. Aggravating factors: None known. Symptoms have essentially resolved. Patient has had no prior foot problems. Evaluation to date: none. Treatment to date: otc ibuprofen.        Review of Systems   Constitutional: Negative for fever and chills.   HENT: Negative.    Eyes: Negative.    Respiratory: Negative.    Cardiovascular: Negative.    Gastrointestinal: Negative.    Endocrine: Negative.    Genitourinary: Negative.    Musculoskeletal: Positive for back pain and arthralgias.   Skin: Negative.  Negative for rash and wound.   Allergic/Immunologic: Positive for environmental allergies.   Neurological: Negative.  Negative for dizziness and headaches.   Hematological: Negative.    Psychiatric/Behavioral: Negative.      BP 136/88 mmHg   Pulse 97   Ht 5\' 5"  (1.651 m)   Wt 187 lb (84.823 kg)   BMI 31.12 kg/m2   SpO2 95%    Objective:   Physical Exam   Constitutional: She is oriented to person, place, and time. She appears well-developed and well-nourished.   HENT:   Head: Normocephalic and atraumatic.   Eyes: Conjunctivae and EOM are normal. Pupils are equal, round, and reactive to light.   Neck: Normal range of motion. Neck supple.   Cardiovascular: Normal rate, regular rhythm and normal heart sounds.    Pulmonary/Chest: Effort normal and breath sounds normal.   Abdominal: Soft. Bowel sounds are normal.   Musculoskeletal: Normal range of motion.        Right foot: There is no tenderness and no swelling.        Feet:    Neurological: She is alert and oriented to person, place, and time.   Skin: Skin is warm and dry.    Psychiatric: She has a normal mood and affect.   Vitals reviewed.      Assessment/Plan:      Lisa QuinLinda was seen today for foot pain.    Diagnoses and associated orders for this visit:    Anxiety: well controlled. Repeat labs.   - COMPREHENSIVE METABOLIC PANEL  - PARoxetine (PAXIL) 40 MG tablet; Take 1 tablet by mouth daily    Mixed hyperlipidemia: repeat fasting lipids.   - Lipid Panel    Chronic back pain: patient has chronic back pain.     Right foot pain: resolved at present. Patient reports redness, tenderness and pain at right great toe PIP joint. No erythema, swelling or warmth at present however I do suspect patient was suffering from a flare of gout. Discussed GOUT and low purine diet. See patient instructions.     Chronic joint pain: Controlled Substances Monitoring: Attestation: The Prescription Monitoring Report for this patient was reviewed today. Lisa York(Lisa Luepke D Arienna Benegas, CNP)  Documentation: No signs of potential drug abuse or diversion identified. Lisa York(Lisa Starling D Jenine Krisher, CNP)  - HYDROcodone-acetaminophen (NORCO) 10-325 MG per tablet; Take 1 tablet by mouth every 6 hours as needed for Pain    Gastroesophageal reflux disease without  esophagitis  - omeprazole (PRILOSEC) 40 MG capsule; Take 1 capsule by mouth daily    Primary insomnia  - zolpidem (AMBIEN) 5 MG tablet; Take 1 tablet by mouth nightly as needed for Sleep

## 2015-04-30 LAB — COMPREHENSIVE METABOLIC PANEL
ALT: 22 U/L (ref 10–40)
AST: 19 U/L (ref 15–37)
Albumin/Globulin Ratio: 1.7 (ref 1.1–2.2)
Albumin: 4.7 g/dL (ref 3.4–5.0)
Alkaline Phosphatase: 82 U/L (ref 40–129)
Anion Gap: 12 (ref 3–16)
BUN: 14 mg/dL (ref 7–20)
CO2: 26 mmol/L (ref 21–32)
Calcium: 9.8 mg/dL (ref 8.3–10.6)
Chloride: 102 mmol/L (ref 99–110)
Creatinine: 0.6 mg/dL (ref 0.6–1.2)
GFR African American: >60 (ref >60)
GFR Non-African American: >60 (ref >60)
Globulin: 2.7 g/dL
Glucose: 109 mg/dL — ABNORMAL HIGH (ref 70–99)
Potassium: 5.3 mmol/L — ABNORMAL HIGH (ref 3.5–5.1)
Sodium: 140 mmol/L (ref 136–145)
Total Bilirubin: 0.4 mg/dL (ref 0.0–1.0)
Total Protein: 7.4 g/dL (ref 6.4–8.2)

## 2015-04-30 LAB — LIPID PANEL
Cholesterol, Total: 263 mg/dL — ABNORMAL HIGH (ref 0–199)
HDL: 45 mg/dL (ref 40–60)
LDL Calculated: 171 mg/dL — ABNORMAL HIGH (ref <100)
Triglycerides: 236 mg/dL — ABNORMAL HIGH (ref 0–150)
VLDL Cholesterol Calculated: 47 mg/dL

## 2015-05-02 MED ORDER — ATORVASTATIN CALCIUM 20 MG PO TABS
20 MG | ORAL_TABLET | Freq: Every day | ORAL | Status: DC
Start: 2015-05-02 — End: 2015-09-04

## 2015-06-05 ENCOUNTER — Encounter

## 2015-06-05 MED ORDER — HYDROCODONE-ACETAMINOPHEN 10-325 MG PO TABS
10-325 MG | ORAL_TABLET | Freq: Four times a day (QID) | ORAL | Status: DC | PRN
Start: 2015-06-05 — End: 2015-07-02

## 2015-07-02 ENCOUNTER — Encounter

## 2015-07-02 MED ORDER — HYDROCODONE-ACETAMINOPHEN 10-325 MG PO TABS
10-325 MG | ORAL_TABLET | Freq: Four times a day (QID) | ORAL | 0 refills | Status: DC | PRN
Start: 2015-07-02 — End: 2015-07-31

## 2015-07-02 NOTE — Telephone Encounter (Signed)
Pt is leaving out of [place] on [place]urs.  Will need to pick up tomorrow.  Call when ready.

## 2015-07-15 ENCOUNTER — Ambulatory Visit: Admit: 2015-07-15 | Discharge: 2015-07-15 | Payer: MEDICARE | Attending: Family | Primary: Family Medicine

## 2015-07-15 DIAGNOSIS — J029 Acute pharyngitis, unspecified: Secondary | ICD-10-CM

## 2015-07-15 LAB — POCT RAPID STREP A: Strep A Ag: NOT DETECTED

## 2015-07-15 MED ORDER — AZITHROMYCIN 250 MG PO TABS
250 MG | ORAL_TABLET | ORAL | 0 refills | Status: DC
Start: 2015-07-15 — End: 2015-09-04

## 2015-07-15 MED ORDER — PROMETHAZINE-DM 6.25-15 MG/5ML PO SYRP
Freq: Four times a day (QID) | ORAL | 0 refills | Status: AC | PRN
Start: 2015-07-15 — End: 2015-07-25

## 2015-07-15 MED ORDER — METHYLPREDNISOLONE 4 MG PO TBPK
4 MG | PACK | ORAL | 0 refills | Status: AC
Start: 2015-07-15 — End: 2015-07-21

## 2015-07-15 MED ORDER — ALBUTEROL SULFATE HFA 108 (90 BASE) MCG/ACT IN AERS
108 (90 Base) MCG/ACT | Freq: Four times a day (QID) | RESPIRATORY_TRACT | 3 refills | Status: DC | PRN
Start: 2015-07-15 — End: 2015-10-16

## 2015-07-15 NOTE — Patient Instructions (Signed)
Drink plenty of fluids, take medication as ordered, only fill script for antibiotics if symptoms worsen or do not improve.     Learning About Chronic Bronchitis  What is chronic bronchitis?     Chronic bronchitis is long-term swelling and the buildup of mucus in the airways of your lungs. The airways (bronchial tubes) get inflamed and make a lot of mucus. This can narrow or block the airways, making it hard for you to breathe. It is a form of COPD (chronic obstructive pulmonary disease).  Chronic bronchitis is usually caused by smoking. But chemical fumes, dust, or air pollution also can cause it over time.  What can you expect when you have chronic bronchitis?  Chronic bronchitis gets worse over time. You cannot undo the damage to your lungs.  Over time, you may find that:  ?? You get short of breath even when you do simple things like get dressed or fix a meal.  ?? It is hard to eat or exercise.  ?? You lose weight and feel weaker.  Over many years, the swelling and mucus from chronic bronchitis make it more likely that you will get lung infections.  But there are things you can do to prevent more damage and feel better.  What are the symptoms?  The main symptoms of chronic bronchitis are:  ?? A cough that will not go away.  ?? Mucus that comes up when you cough.  ?? Shortness of breath that gets worse when you exercise.  At times, your symptoms may suddenly flare up and get much worse. This is a called an exacerbation (say "egg-ZASS-er-BAY-shun"). When this happens, your usual symptoms quickly get worse and stay bad. This can be dangerous. You may have to go to the hospital.  How can you keep chronic bronchitis from getting worse?  Don't smoke. That is the best way to keep chronic bronchitis from getting worse. If you already smoke, it is never too late to stop. If you need help quitting, talk to your doctor about stop-smoking programs and medicines. These can increase your chances of quitting for good.  You can do  other things to keep chronic bronchitis from getting worse:  ?? Avoid bad air. Air pollution, chemical fumes, and dust also can make chronic bronchitis worse.  ?? Get a flu shot every year. A shot may keep the flu from turning into something more serious, like pneumonia. A flu shot also may lower your chances of having a flare-up.  ?? Get a pneumococcal shot. A shot can prevent some of the serious complications of pneumonia. Ask your doctor how often you should get this shot.  How is chronic bronchitis treated?  Chronic bronchitis is treated with medicines and oxygen. You also can take steps at home to stay healthy and keep your condition from getting worse.  Medicines and oxygen therapy  ?? You may be taking medicines such as:  ?? Bronchodilators. These help open your airways and make breathing easier. Bronchodilators are either short-acting (work for 6 to 9 hours) or long-acting (work for 24 hours). You inhale most bronchodilators, so they start to act quickly. Always carry your quick-relief inhaler with you in case you need it while you are away from home.  ?? Corticosteroids. These reduce airway inflammation. They come in pill or inhaled form. You must take these medicines every day for them to work well.  ?? Antibiotics. These medicines are used when you have a bacterial lung infection.  ?? Take your medicines  exactly as prescribed. Call your doctor if you think you are having a problem with your medicine.  ?? Oxygen therapy boosts the amount of oxygen in your blood and helps you breathe easier. Use the flow rate your doctor has recommended, and do not change it without talking to your doctor first.  Other care at home  ?? If your doctor recommends it, get more exercise. Walking is a good choice. Bit by bit, increase the amount you walk every day. Try for at least 30 minutes on most days of the week.  ?? Learn breathing methods--such as breathing through pursed lips--to help you become less short of breath.  ?? If your  doctor has not set you up with a pulmonary rehabilitation program, talk to him or her about whether rehab is right for you. Rehab includes exercise programs, education about your disease and how to manage it, help with diet and other changes, and emotional support.  ?? Eat regular, healthy meals. Use bronchodilators about 1 hour before you eat to make it easier to eat. Eat several small meals instead of three large ones. Drink beverages at the end of the meal. Avoid foods that are hard to chew.  Follow-up care is a key part of your treatment and safety. Be sure to make and go to all appointments, and call your doctor if you are having problems. It's also a good idea to know your test results and keep a list of the medicines you take.   Where can you learn more?   Go to [email or web address] and sign in to your MyChart account. Enter [id number] in the Search Health Information box to learn more about ???Learning About Chronic Bronchitis.???    If you do not have an account, please click on the ???Sign Up Now??? link.   ?? 2006-2016 [employer]. Care instructions adapted under license by [facility]. This care instruction is for use with your licensed healthcare professional. If you have questions about a medical condition or this instruction, always ask your healthcare professional. [employer] disclaims any warranty or liability for your use of this information.  Content Version: 10.8.513193; Current as of: August 17, 2014        Cough: Care Instructions  Your Care Instructions  A cough is your body's response to something that bothers your throat or airways. Many things can cause a cough. You might cough because of a cold or the flu, bronchitis, or asthma. Smoking, postnasal drip, allergies, and stomach acid that backs up into your throat also can cause coughs.  A cough is a symptom, not a disease. Most coughs stop when the cause, such as a cold, goes away. You can take a few steps at  home to cough less and feel better.  Follow-up care is a key part of your treatment and safety. Be sure to make and go to all appointments, and call your doctor if you are having problems. It's also a good idea to know your test results and keep a list of the medicines you take.  How can you care for yourself at home?  ?? Drink lots of water and other fluids. This helps thin the mucus and soothes a dry or sore throat. Honey or lemon juice in hot water or tea may ease a dry cough.  ?? Take cough medicine as directed by your doctor.  ?? Prop up your head on pillows to help you breathe and ease a dry cough.  ?? Try cough drops to soothe  a dry or sore throat. Cough drops don't stop a cough. Medicine-flavored cough drops are no better than candy-flavored drops or hard candy.  ?? Do not smoke. Avoid secondhand smoke. If you need help quitting, talk to your doctor about stop-smoking programs and medicines. These can increase your chances of quitting for good.  When should you call for help?  Call 911 anytime you think you may need emergency care. For example, call if:  ?? You have severe trouble breathing.  Call your doctor now or seek immediate medical care if:  ?? You cough up blood.  ?? You have new or worse trouble breathing.  ?? You have a new or higher fever.  ?? You have a new rash.  Watch closely for changes in your health, and be sure to contact your doctor if:  ?? You cough more deeply or more often, especially if you notice more mucus or a change in the color of your mucus.  ?? You have new symptoms, such as a sore throat, an earache, or sinus pain.  ?? You do not get better as expected.   Where can you learn more?   Go to [email or web address] and sign in to your MyChart account. Enter D279 in the Search Health Information box to learn more about ???Cough: Care Instructions.???    If you do not have an account, please click on the ???Sign Up Now??? link.   ?? 2006-2016 [employer]. Care instructions  adapted under license by [facility]. This care instruction is for use with your licensed healthcare professional. If you have questions about a medical condition or this instruction, always ask your healthcare professional. [employer] disclaims any warranty or liability for your use of this information.  Content Version: 10.8.513193; Current as of: August 17, 2014

## 2015-07-15 NOTE — Progress Notes (Signed)
Subjective:      Patient ID: Lisa York is a 64 y.o. female.  Chief Complaint   Patient presents with   ??? Pharyngitis     fever, ear pain, cough, runny nose, for 3 days      HPI Comments: Sore Throat  Patient complains of sore throat. Associated symptoms include dry cough, headache, night sweats, post nasal drip, sneezing, sore throat and bilat ear pain.Onset of symptoms was 3 days ago, unchanged since that time. She is drinking plenty of fluids. She has not had recent close exposure to someone with proven streptococcal pharyngitis.        Review of Systems   Constitutional: Positive for chills and fever. Negative for appetite change.   HENT: Positive for congestion, ear pain, postnasal drip, sneezing and sore throat. Negative for ear discharge and sinus pressure.    Eyes: Negative.    Respiratory: Positive for cough and chest tightness (with cough). Negative for shortness of breath and wheezing.    Cardiovascular: Negative.  Negative for chest pain and palpitations.   Gastrointestinal: Positive for diarrhea. Negative for nausea and vomiting.   Endocrine: Negative.    Musculoskeletal: Negative.    Skin: Negative for rash.   Allergic/Immunologic: Positive for environmental allergies.   Neurological: Positive for headaches. Negative for dizziness.   Hematological: Negative.    Psychiatric/Behavioral: Negative.  Negative for sleep disturbance.       Objective:   Physical Exam   Constitutional: She is oriented to person, place, and time. She appears well-developed and well-nourished. She has a sickly appearance. No distress.   HENT:   Head: Normocephalic and atraumatic.   Right Ear: Hearing, external ear and ear canal normal. Tympanic membrane is erythematous and bulging. A middle ear effusion is present.   Left Ear: Hearing, external ear and ear canal normal. Tympanic membrane is bulging. A middle ear effusion is present.   Nose: Nose normal.   Mouth/Throat: Uvula is midline and mucous membranes are normal. No  oropharyngeal exudate or posterior oropharyngeal edema.   Eyes: Conjunctivae and EOM are normal. Pupils are equal, round, and reactive to light.   Neck: Normal range of motion. Neck supple.   Cardiovascular: Normal rate, regular rhythm, S1 normal, S2 normal and normal heart sounds.    Pulmonary/Chest: Effort normal. She has decreased breath sounds (slightly diminished throughout ). She has no wheezes. She has no rhonchi. She has no rales.   Abdominal: Soft. Bowel sounds are normal.   Musculoskeletal: Normal range of motion.   Lymphadenopathy:     She has no cervical adenopathy.   Neurological: She is alert and oriented to person, place, and time.   Skin: Skin is warm, dry and intact.   Psychiatric: She has a normal mood and affect.   Vitals reviewed.    Visit Vitals   ??? BP 120/82   ??? Pulse 105   ??? Temp 98.2 ??F (36.8 ??C)   ??? Ht 5' 4.96" (1.65 m)   ??? Wt 188 lb (85.3 kg)   ??? SpO2 97%   ??? BMI 31.32 kg/m2       Assessment/Plan:      [name] was seen today for pharyngitis.    Diagnoses and all orders for this visit:    Sore throat: rapid strep is negative. Likely viral in nature. Postnasal drip is likely flaring up bronchitis. Will tx. As below. Instructed to drink plenty of fluids, Only fill script for antibiotics if symptoms worsen or do not improver  the next 2-3 days. Patient to f/u if no better or worsening of symptoms.  Orders:  -     POCT rapid strep A    Cough: likely due to drainage. Patient to f/u if no better or worsening of symptoms. If worsening symptoms should consider chest x-ray.   Orders:  -     promethazine-dextromethorphan (PROMETHAZINE-DM) 6.25-15 MG/5ML syrup; Take 5 mLs by mouth 4 times daily as needed for Cough  -     methylPREDNISolone (MEDROL, PAK,) 4 MG tablet; Take by mouth.    Chronic bronchitis, unspecified chronic bronchitis type [facility](HCC): see note above.   Orders:  -     albuterol sulfate HFA (PROAIR HFA) 108 (90 BASE) MCG/ACT inhaler; Inhale 2 puffs into the lungs every 6 hours as needed for  Wheezing  -     azithromycin (ZITHROMAX Z-PAK) 250 MG tablet; 2 tablets or 500 mg on day one then 1 tablet or 250 mg by mouth on days 2-5.

## 2015-07-31 ENCOUNTER — Encounter

## 2015-08-01 MED ORDER — HYDROCODONE-ACETAMINOPHEN 10-325 MG PO TABS
10-325 MG | ORAL_TABLET | Freq: Four times a day (QID) | ORAL | 0 refills | Status: DC | PRN
Start: 2015-08-01 — End: 2015-09-03

## 2015-08-01 NOTE — Telephone Encounter (Signed)
Controlled Substances Monitoring: Attestation: The Prescription Monitoring Report for this patient was reviewed today. (Andretta Ergle D Donovan Gatchel, CNP)  Documentation: No signs of potential drug abuse or diversion identified. (Marv Alfrey D Chrisangel Eskenazi, CNP)

## 2015-09-03 ENCOUNTER — Encounter

## 2015-09-03 MED ORDER — HYDROCODONE-ACETAMINOPHEN 10-325 MG PO TABS
10-325 MG | ORAL_TABLET | Freq: Four times a day (QID) | ORAL | 0 refills | Status: DC | PRN
Start: 2015-09-03 — End: 2015-09-30

## 2015-09-04 ENCOUNTER — Ambulatory Visit: Admit: 2015-09-04 | Discharge: 2015-09-04 | Payer: MEDICARE | Attending: Family | Primary: Family Medicine

## 2015-09-04 DIAGNOSIS — H9201 Otalgia, right ear: Secondary | ICD-10-CM

## 2015-09-04 LAB — COMPREHENSIVE METABOLIC PANEL
ALT: 17 U/L (ref 10–40)
AST: 16 U/L (ref 15–37)
Albumin/Globulin Ratio: 1.6 (ref 1.1–2.2)
Albumin: 4.5 g/dL (ref 3.4–5.0)
Alkaline Phosphatase: 87 U/L (ref 40–129)
Anion Gap: 14 (ref 3–16)
BUN: 9 mg/dL (ref 7–20)
CO2: 26 mmol/L (ref 21–32)
Calcium: 9.9 mg/dL (ref 8.3–10.6)
Chloride: 98 mmol/L — ABNORMAL LOW (ref 99–110)
Creatinine: 0.6 mg/dL (ref 0.6–1.2)
GFR African American: >60 (ref >60)
GFR Non-African American: >60 (ref >60)
Globulin: 2.9 g/dL
Glucose: 99 mg/dL (ref 70–99)
Potassium: 4.9 mmol/L (ref 3.5–5.1)
Sodium: 138 mmol/L (ref 136–145)
Total Bilirubin: 0.4 mg/dL (ref 0.0–1.0)
Total Protein: 7.4 g/dL (ref 6.4–8.2)

## 2015-09-04 LAB — CBC
Hematocrit: 41.8 % (ref 36.0–48.0)
Hemoglobin: 14.3 g/dL (ref 12.0–16.0)
MCH: 31.3 pg (ref 26.0–34.0)
MCHC: 34.2 g/dL (ref 31.0–36.0)
MCV: 91.7 fL (ref 80.0–100.0)
MPV: 8.2 fL (ref 5.0–10.5)
Platelets: 296 10*3/uL (ref 135–450)
RBC: 4.55 M/uL (ref 4.00–5.20)
RDW: 12.9 % (ref 12.4–15.4)
WBC: 11.2 10*3/uL — ABNORMAL HIGH (ref 4.0–11.0)

## 2015-09-04 LAB — VITAMIN B12: Vitamin B-12: 397 pg/mL (ref 211–911)

## 2015-09-04 LAB — T4, FREE: T4 Free: 1.2 ng/dL (ref 0.9–1.8)

## 2015-09-04 LAB — TSH: TSH: 1.02 u[IU]/mL (ref 0.27–4.20)

## 2015-09-04 MED ORDER — FLUTICASONE PROPIONATE 50 MCG/ACT NA SUSP
50 MCG/ACT | Freq: Every day | NASAL | 3 refills | Status: DC
Start: 2015-09-04 — End: 2016-04-01

## 2015-09-04 NOTE — Progress Notes (Signed)
Subjective:      Patient ID: Lisa York is a 64 y.o. female.  Chief Complaint   Patient presents with   ??? Otalgia     Right ear   ??? Pharyngitis     Otalgia    There is pain in the right ear. This is a new problem. The current episode started in the past 7 days. The problem occurs constantly. The problem has been gradually worsening. There has been no fever. The pain is at a severity of 7/10. The pain is moderate. Associated symptoms include coughing (productive of clear phlegm), rhinorrhea and a sore throat. Pertinent negatives include no ear discharge, headaches, rash or vomiting. Treatments tried: otc sweet oil ear drops and vicks rub topically.       Review of Systems   Constitutional: Negative for appetite change, chills, fever and unexpected weight change.   HENT: Positive for ear pain, rhinorrhea and sore throat. Negative for ear discharge.    Respiratory: Positive for cough (productive of clear phlegm). Negative for wheezing.    Cardiovascular: Negative.    Gastrointestinal: Negative.  Negative for vomiting.   Genitourinary: Negative.    Skin: Negative for rash.   Neurological: Negative for dizziness and headaches.   Psychiatric/Behavioral: Negative for sleep disturbance.       Objective:   Physical Exam   Constitutional: She is oriented to person, place, and time. She appears well-developed and well-nourished.   HENT:   Head: Normocephalic and atraumatic.   Right Ear: Hearing, external ear and ear canal normal. Tympanic membrane is erythematous (very mild). A middle ear effusion is present.   Left Ear: Hearing, tympanic membrane, external ear and ear canal normal.   Nose: Rhinorrhea present. Right sinus exhibits no maxillary sinus tenderness and no frontal sinus tenderness. Left sinus exhibits no maxillary sinus tenderness and no frontal sinus tenderness.   Mouth/Throat: Uvula is midline and mucous membranes are normal. Posterior oropharyngeal erythema present. No oropharyngeal exudate, posterior  oropharyngeal edema or tonsillar abscesses.   Eyes: Conjunctivae and EOM are normal. Pupils are equal, round, and reactive to light.   Neck: Normal range of motion. Neck supple.   Cardiovascular: Normal rate, regular rhythm, S1 normal, S2 normal, normal heart sounds, intact distal pulses and normal pulses.  Exam reveals no gallop.    No murmur heard.  Pulmonary/Chest: Effort normal. No accessory muscle usage. No respiratory distress. She has decreased breath sounds (slightly diminished throughout).   Abdominal: Soft. Bowel sounds are normal.   Musculoskeletal: Normal range of motion.   Neurological: She is alert and oriented to person, place, and time.   Skin: Skin is warm, dry and intact.   Psychiatric: She has a normal mood and affect.   Vitals reviewed.    Visit Vitals   ??? BP 132/80   ??? Pulse 84   ??? Ht 5\' 5"  (1.651 m)   ??? Wt 188 lb (85.3 kg)   ??? SpO2 97%   ??? BMI 31.28 kg/m2       Assessment/Plan:      Lisa York was seen today for otalgia and pharyngitis.    Diagnoses and all orders for this visit:    Otalgia of right ear: Ear is unremarkable for the exception of bilat serous effusion and TM on right is minimally erythematous.  I suspect symptoms are related to environmental allergies. Patient states she did start zyrtec 2 days ago. Recommend patient continue with zyrtec and will add flonase as below. Encouraged patient to drink  plenty of fluids and Patient to f/u if no better or worsening of symptoms. Also see patient instructions.     Other allergic rhinitis: see note above.   Orders:  -     fluticasone (FLONASE) 50 MCG/ACT nasal spray; 1 spray by Nasal route daily    Fatigue, unspecified type: patient reports chronic fatigue. Order for labs as below. Also see patient instructions.   Orders:  -     CBC  -     VITAMIN B12  -     TSH without Reflex  -     T4, FREE  -     COMPREHENSIVE METABOLIC PANEL

## 2015-09-04 NOTE — Patient Instructions (Signed)
Continue to take zyrtec daily, take flonase as ordered and Patient to f/u if no better or worsening of symptoms.    Managing Your Allergies: Care Instructions  Your Care Instructions  Managing your allergies is an important part of staying healthy. Your doctor will help you find out what may be causing the allergies. Common causes of allergy symptoms are house dust and dust mites, animal dander, mold, and pollen.  As soon as you know what triggers your symptoms, try to reduce your exposure to your triggers. This can help prevent allergy symptoms, asthma, and other health problems.  Ask your doctor about allergy medicine or immunotherapy. These treatments may help reduce or prevent allergy symptoms.  Follow-up care is a key part of your treatment and safety. Be sure to make and go to all appointments, and call your doctor if you are having problems. It's also a good idea to know your test results and keep a list of the medicines you take.  How can you care for yourself at home?  ?? If you think that dust or dust mites are causing your allergies:  ?? Wash sheets, pillowcases, and other bedding every week in hot water.  ?? Use airtight, dust-proof covers for pillows, duvets, and mattresses. Avoid plastic covers, because they tend to tear quickly and do not "breathe." Wash according to the instructions.  ?? Remove extra blankets and pillows that you don't need.  ?? Use blankets that are machine-washable.  ?? Don't use home humidifiers. They can help mites live longer.  ?? Use air-conditioning. Change or clean all filters every month. Keep windows closed. Use high-efficiency air filters. Don't use window or attic fans, which draw dust into the air.  ?? If you're allergic to pet dander, keep pets outside or, at the very least, out of your bedroom. Old carpet and cloth-covered furniture can hold a lot of animal dander. You may need to replace them.  ?? Look for signs of cockroaches. Use cockroach baits to get rid of them. Then  clean your home well.  ?? If you're allergic to mold, don't keep indoor plants, because molds can grow in soil. Get rid of furniture, rugs, and drapes that smell musty. Check for mold in the bathroom.  ?? If you're allergic to pollen, stay inside when pollen counts are high.  ?? Don't smoke or let anyone else smoke in your house. Don't use fireplaces or wood-burning stoves. Avoid paint fumes, perfumes, and other strong odors.  When should you call for help?  Give an epinephrine shot if:  ?? You think you are having a severe allergic reaction.  ?? You have symptoms in more than one body area, such as mild nausea and an itchy mouth.  After giving an epinephrine shot call 911, even if you feel better.  Call 911 if:  ?? You have symptoms of a severe allergic reaction. These may include:  ?? Sudden raised, red areas (hives) all over your body.  ?? Swelling of the throat, mouth, lips, or tongue.  ?? Trouble breathing.  ?? Passing out (losing consciousness). Or you may feel very lightheaded or suddenly feel weak, confused, or restless.  ?? You have been given an epinephrine shot, even if you feel better.  Call your doctor now or seek immediate medical care if:  ?? You have symptoms of an allergic reaction, such as:  ?? A rash or hives (raised, red areas on the skin).  ?? Itching.  ?? Swelling.  ?? Belly pain,  nausea, or vomiting.  Watch closely for changes in your health, and be sure to contact your doctor if:  ?? Your allergies get worse.  ?? You need help controlling your allergies.  ?? You have questions about allergy testing.  ?? You do not get better as expected.   Where can you learn more?   Go to [email or web address] and sign in to your MyChart account. Enter L249 in the Search Health Information box to learn more about ???Managing Your Allergies: Care Instructions.???    If you do not have an account, please click on the ???Sign Up Now??? link.   ?? 2006-2016 [employer]. Care instructions adapted under license  by [facility]. This care instruction is for use with your licensed healthcare professional. If you have questions about a medical condition or this instruction, always ask your healthcare professional. [employer], Inc[employer] disclaims any warranty or liability for your use of this information.  Content Version: 10.8.513193; Current as of: November 09, 2014          fluticasone nasal  Pronunciation:  floo TIK a sone  Brand:  Flonase, Veramyst  What is fluticasone nasal?  Fluticasone is a steroid. It prevents the release of substances in the body that cause inflammation.  Fluticasone nasal (for the nose) is used to treat nasal congestion, sneezing, runny nose, and itchy or watery eyes caused by seasonal or year-round allergies.  The Flonase brand of this medicine for use in adults and children who are at least 30 years old. Veramyst may be used in children as young as 20 years old. Flonase is available without a prescription.  Fluticasone nasal may also be used for purposes not listed in this medication guide.  What should I discuss with my healthcare provider before using fluticasone nasal?  You should not use fluticasone nasal if you are allergic to it.  Fluticasone can weaken your immune system, making it easier for you to get an infection or worsening an infection you already have or recently had. Tell your doctor about any illness or infection you have had within the past several weeks.  To make sure you can safely use fluticasone nasal, tell your doctor if you have any of these other conditions:  ?? tuberculosis or any other infection or illness;  ?? glaucoma or cataracts;  ?? liver disease;  ?? herpes simplex virus of your eyes;  ?? sores or ulcers inside your nose; or  ?? if you have recently had injury of or surgery on your nose.  If you use Flonase without a prescription and you have any medical conditions, ask a doctor or pharmacist if this medicine is safe for you.  Also tell your doctor if you have diabetes.  Steroid medicines may increase the glucose (sugar) levels in your blood or urine. You may also need to adjust the dose of your diabetes medications.  It is not known whether fluticasone nasal will harm an unborn baby. Do not use this medicine without a doctor's advice if you are pregnant or plan to become pregnant.  It is not known whether fluticasone nasal passes into breast milk or if it could harm a nursing baby. Do not use this medicine without a doctor's advice if you are breast-feeding a baby.  Steroid medicine can affect growth in children. Tell your doctor if your child is not growing at a normal rate while using this medicine.  How should I use fluticasone nasal?  Use exactly as directed on the label, or as  prescribed by your doctor. Do not use in larger or smaller amounts or for longer than recommended. Do not share this medicine with another person, even if they have the same symptoms you have.  The usual dose of fluticasone nasal is 1 to 2 sprays into each nostril once per day. Your dose may change after your symptoms improve. Follow all dosing instructions very carefully.  Do not use Flonase in a child younger than 77 years old. Do not use Veramyst in a child younger than 75 years old.  Any child using fluticasone nasal should be supervised by an adult while using the nasal spray.   This medication comes with patient instructions for safe and effective use, and directions for priming the inhaler device. Follow these directions carefully. Ask your doctor or pharmacist if you have any questions.  If you use Flonase, shake the inhaler device gently before each use. If you use Veramyst, shake the device vigorously before each use.  Before your first use, prime the nasal spray pump by shaking the medicine well and spraying 6 test sprays into the air (away from your face), until a fine mist appears. Prime the Flonase spray pump any time you have not used it for 7 days or longer. Prime the Veramyst spray pump  any time you have not used it for longer than 30 days, or if you have left the cap off for 5 days or longer.  If you switched to fluticasone from another steroid medicine, do not stop using the other steroid suddenly or you may have unpleasant withdrawal symptoms. Talk with your doctor about tapering your steroid dose before stopping completely.  To be sure fluticasone nasal is not causing harmful effects on your nose or sinuses, your doctor may need to check your progress on a regular basis.  It may take up to several days before your symptoms improve. Keep using the medication as directed and tell your doctor if your symptoms do not improve after a week of treatment.  Store fluticasone nasal in an upright position at room temperature, away from moisture and heat. Throw the spray bottle away after you have used 120 sprays, even if there is still medicine left in the bottle.   What happens if I miss a dose?  Use the missed dose as soon as you remember. Skip the missed dose if it is almost time for your next scheduled dose. Do not use extra medicine to make up the missed dose.  What happens if I overdose?  Seek emergency medical attention or call the Poison Help line at [phone].  An overdose of fluticasone nasal is not expected to produce life threatening symptoms. However, long term use of high steroid doses can lead to symptoms such as thinning skin, easy bruising, changes in the shape or location of body fat (especially in your face, neck, back, and waist), increased acne or facial hair, menstrual problems, impotence, or loss of interest in sex.  What should I avoid while using fluticasone nasal?  Avoid getting the spray in your eyes or mouth. If this does happen, rinse with water.  Avoid being near people who are sick or have infections. Call your doctor for preventive treatment if you are exposed to chicken pox or measles. These conditions can be serious or even fatal in people who are using fluticasone  nasal.  What are the possible side effects of fluticasone nasal?  Get emergency medical help if you have signs of an allergic reaction: hives; difficult  breathing; swelling of your face, lips, tongue, or throat.  Call your doctor at once if you have:  ?? severe or ongoing nosebleeds;  ?? noisy breathing, runny nose, or crusting around your nostrils;  ?? redness, sores, or white patches in your mouth or throat;  ?? fever, chills, weakness, nausea, vomiting, flu symptoms;  ?? any wound that will not heal; or  ?? blurred vision, eye pain, or seeing halos around lights.  Common side effects may include:  ?? minor nosebleed, burning or itching in your nose;  ?? sores or white patches inside or around your nose;  ?? cough, trouble breathing;  ?? headache, back pain;  ?? sinus pain, sore throat, fever; or  ?? nausea, vomiting.  This is not a complete list of side effects and others may occur. Call your doctor for medical advice about side effects. You may report side effects to FDA at 1-800-FDA-1088.  What other drugs will affect fluticasone nasal?  Tell your doctor about all your current medicines and any you start or stop using, especially:  ?? antifungal medicine; or  ?? antiviral medicine to treat hepatis C or HIV/AIDS.  This list is not complete. Other drugs may interact with fluticasone nasal, including prescription and over-the-counter medicines, vitamins, and herbal products. Not all possible interactions are listed in this medication guide.  Where can I get more information?  Your pharmacist can provide more information about fluticasone nasal.    Remember, keep this and all other medicines out of the reach of children, never share your medicines with others, and use this medication only for the indication prescribed.   Every effort has been made to ensure that the information provided by [employer]') is accurate, up-to-date, and complete, but no guarantee is made to that effect. Drug information contained  herein may be time sensitive. [employer] information has been compiled for use by healthcare practitioners and consumers in the [place] and therefore [employer] does not warrant that uses outside of the [place] are appropriate, unless specifically indicated otherwise. [employer]'s drug information does not endorse drugs, diagnose patients or recommend therapy. [employer]'s drug information is an [employer] corporate to assist licensed healthcare practitioners in caring for their patients and/or to serve consumers viewing this service as a supplement to, and not a substitute for, the expertise, skill, knowledge and judgment of healthcare practitioners. The absence of a warning for a given drug or drug combination in no way should be construed to indicate that the drug or drug combination is safe, effective or appropriate for any given patient. [employer] does not assume any responsibility for any aspect of healthcare administered with the aid of information [employer] provides. The information contained herein is not intended to cover all possible uses, directions, precautions, warnings, drug interactions, allergic reactions, or adverse effects. If you have questions about the drugs you are taking, check with your doctor, nurse or pharmacist.  Copyright [phone]) [phone] [employer] Version: 8.02. Revision date: 08/24/2014.  This information does not replace the advice of a doctor. [employer] Incorporated disclaims any warranty or liability for your use of this information.   Content Version: 10.8.513193        Fatigue: Care Instructions  Your Care Instructions  Fatigue is a feeling of tiredness, exhaustion, or lack of energy. You may feel fatigue because of too much or not enough activity. It can also come from stress, lack of sleep, boredom, and poor diet. Many medical problems, such as viral infections, can cause  fatigue. Emotional problems, especially depression, are often the cause of fatigue.  Fatigue is most  often a symptom of another problem. Treatment for fatigue depends on the cause. For example, if you have fatigue because you have a certain health problem, treating this problem also treats your fatigue. If depression or anxiety is the cause, treatment may help.  Follow-up care is a key part of your treatment and safety. Be sure to make and go to all appointments, and call your doctor if you are having problems. It's also a good idea to know your test results and keep a list of the medicines you take.  How can you care for yourself at home?  ?? Get regular exercise. But don't overdo it. Go back and forth between rest and exercise.  ?? Get plenty of rest.  ?? Eat a healthy diet. Do not skip meals, especially breakfast.  ?? Reduce your use of caffeine, tobacco, and alcohol. Caffeine is most often found in coffee, tea, cola drinks, and chocolate.  ?? Limit medicines that can cause fatigue. This includes tranquilizers and cold and allergy medicines.  When should you call for help?  Watch closely for changes in your health, and be sure to contact your doctor if:  ?? You have new symptoms such as fever or a rash.  ?? Your fatigue gets worse.  ?? You have been feeling down, depressed, or hopeless. Or you may have lost interest in things that you usually enjoy.  ?? You are not getting better as expected.   Where can you learn more?   Go to [email or web address] and sign in to your MyChart account. Enter [phone] in the Search Health Information box to learn more about ???Fatigue: Care Instructions.???    If you do not have an account, please click on the ???Sign Up Now??? link.   ?? 2006-2016 [employer], Inc[employer]. Care instructions adapted under license by [facility]. This care instruction is for use with your licensed healthcare professional. If you have questions about a medical condition or this instruction, always ask your healthcare professional. [employer] disclaims any warranty or liability for your  use of this information.  Content Version: 10.8.513193; Current as of: November 16, 2014

## 2015-09-10 ENCOUNTER — Encounter

## 2015-09-10 MED ORDER — ZOLPIDEM TARTRATE 5 MG PO TABS
5 MG | ORAL_TABLET | ORAL | 2 refills | Status: DC
Start: 2015-09-10 — End: 2016-06-03

## 2015-09-30 ENCOUNTER — Encounter

## 2015-09-30 MED ORDER — HYDROCODONE-ACETAMINOPHEN 10-325 MG PO TABS
10-325 MG | ORAL_TABLET | Freq: Four times a day (QID) | ORAL | 0 refills | Status: DC | PRN
Start: 2015-09-30 — End: 2015-10-29

## 2015-10-16 ENCOUNTER — Ambulatory Visit: Admit: 2015-10-16 | Discharge: 2015-10-16 | Payer: MEDICARE | Attending: Family | Primary: Family Medicine

## 2015-10-16 DIAGNOSIS — J029 Acute pharyngitis, unspecified: Secondary | ICD-10-CM

## 2015-10-16 MED ORDER — ALBUTEROL SULFATE HFA 108 (90 BASE) MCG/ACT IN AERS
108 (90 Base) MCG/ACT | Freq: Four times a day (QID) | RESPIRATORY_TRACT | 5 refills | Status: DC | PRN
Start: 2015-10-16 — End: 2016-08-20

## 2015-10-16 MED ORDER — AMOXICILLIN 500 MG PO CAPS
500 MG | ORAL_CAPSULE | Freq: Two times a day (BID) | ORAL | 0 refills | Status: AC
Start: 2015-10-16 — End: 2015-10-26

## 2015-10-16 NOTE — Patient Instructions (Signed)
drink plenty of fluids, do warm salt water gargles 2-3 times per day and f/u if no better or worsening of symptoms.    Sore Throat: Care Instructions  Your Care Instructions     Infection by bacteria or a virus causes most sore throats. Cigarette smoke, dry air, air pollution, allergies, and yelling can also cause a sore throat. Sore throats can be painful and annoying. Fortunately, most sore throats go away on their own. If you have a bacterial infection, your doctor may prescribe antibiotics.  Follow-up care is a key part of your treatment and safety. Be sure to make and go to all appointments, and call your doctor if you are having problems. It's also a good idea to know your test results and keep a list of the medicines you take.  How can you care for yourself at home?  ?? If your doctor prescribed antibiotics, take them as directed. Do not stop taking them just because you feel better. You need to take the full course of antibiotics.  ?? Gargle with warm salt water once an hour to help reduce swelling and relieve discomfort. Use 1 teaspoon of salt mixed in 1 cup of warm water.  ?? Take an over-the-counter pain medicine, such as acetaminophen (Tylenol), ibuprofen (Advil, Motrin), or naproxen (Aleve). Read and follow all instructions on the label.  ?? Be careful when taking over-the-counter cold or flu medicines and Tylenol at the same time. Many of these medicines have acetaminophen, which is Tylenol. Read the labels to make sure that you are not taking more than the recommended dose. Too much acetaminophen (Tylenol) can be harmful.  ?? Drink plenty of fluids. Fluids may help soothe an irritated throat. Hot fluids, such as tea or soup, may help decrease throat pain.  ?? Use over-the-counter throat lozenges to soothe pain. Regular cough drops or hard candy may also help. These should not be given to young children because of the risk of choking.  ?? Do not smoke or allow others to smoke around you. If you need help  quitting, talk to your doctor about stop-smoking programs and medicines. These can increase your chances of quitting for good.  ?? Use a vaporizer or humidifier to add moisture to your bedroom. Follow the directions for cleaning the machine.  When should you call for help?  Call your doctor now or seek immediate medical care if:  ?? You have new or worse trouble swallowing.  ?? Your sore throat gets much worse on one side.  Watch closely for changes in your health, and be sure to contact your doctor if you do not get better as expected.  Where can you learn more?  Go to [email or web address] and sign in to your MyChart account. Enter [phone] in the Search Health Information box to learn more about ???Sore Throat: Care Instructions.???    If you do not have an account, please click on the ???Sign Up Now??? link.  ?? 2006-2016 [employer]. Care instructions adapted under license by [facility]. This care instruction is for use with your licensed healthcare professional. If you have questions about a medical condition or this instruction, always ask your healthcare professional. [employer], Inc[employer] disclaims any warranty or liability for your use of this information.  Content Version: 10.9.538570; Current as of: November 16, 2014          amoxicillin  Pronunciation:  am OX i sil in  Brand:  Amoxil, Moxatag, Trimox, Wymox  What is amoxicillin?  Amoxicillin is  a penicillin antibiotic that fights bacteria.  Amoxicillin is used to treat many different types of infection caused by bacteria, such as tonsillitis, bronchitis, pneumonia, gonorrhea, and infections of the ear, nose, throat, skin, or urinary tract.  Amoxicillin is also sometimes used together with another antibiotic called clarithromycin (Biaxin) to treat stomach ulcers caused by Helicobacter pylori infection. This combination is sometimes used with a stomach acid reducer called lansoprazole (Prevacid).  There are many brands and forms of  amoxicillin available and not all brands are listed on this leaflet.   Amoxicillin may also be used for purposes not listed in this medication guide.  What should I discuss with my healthcare provider before taking amoxicillin?  You should not use this medicine if you are allergic to any penicillin antibiotic, such as ampicillin, dicloxacillin, oxacillin, penicillin, or ticarcillin.  To make sure amoxicillin is safe for you, tell your doctor if you have:  ?? asthma;  ?? liver or kidney disease;  ?? mononucleosis (also called "mono");  ?? a history of diarrhea caused by taking antibiotics; or  ?? food or drug allergies (especially to a cephalosporin antibiotic such as Omnicef, Cefzil, Ceftin, Keflex, and others).  If you are being treated for gonorrhea, your doctor may also have you tested for syphilis, another sexually transmitted disease.  Amoxicillin is not expected to harm an unborn baby. Tell your doctor if you are pregnant or plan to become pregnant during treatment.  Amoxicillin can make birth control pills less effective. Ask your doctor about using non hormonal birth control (condom, diaphragm with spermicide) to prevent pregnancy while taking amoxicillin.  Amoxicillin can pass into breast milk and may harm a nursing baby. Tell your doctor if you are breast-feeding a baby.  The amoxicillin chewable tablet may contain phenylalanine. Talk to your doctor before using this form of amoxicillin if you have phenylketonuria (PKU).  How should I take amoxicillin?  Follow all directions on your prescription label. Do not take this medicine in larger or smaller amounts or for longer than recommended.  Take this medicine at the same time each day.  The Moxatag brand of amoxicillin should be taken with food, or within 1 hour after eating a meal.  Some forms of amoxicillin may be taken with or without food. Check your medicine label to see if you should take your amoxicillin with food or not.  You may need to shake amoxicillin  liquid well just before you measure a dose. Follow the directions on your medicine label.  Measure liquid medicine with the dosing syringe provided, or with a special dose-measuring spoon or medicine cup. If you do not have a dose-measuring device, ask your pharmacist for one. You may place the liquid directly on the tongue, or you may mix it with water, milk, baby formula, fruit juice, or ginger ale. Drink all of the mixture right away. Do not save any for later use.  The chewable tablet should be chewed before you swallow it.  Do not crush, chew, or break an extended-release tablet. Swallow it whole.  While using amoxicillin, you may need frequent blood tests. Your kidney and liver function may also need to be checked.  If you are taking amoxicillin with clarithromycin and/or lansoprazole to treat stomach ulcer, use all of your medications as directed. Read the medication guide or patient instructions provided with each medication. Do not change your doses or medication schedule without your doctor's advice.  Use this medicine for the full prescribed length of time. Your symptoms  may improve before the infection is completely cleared. Skipping doses may also increase your risk of further infection that is resistant to antibiotics. Amoxicillin will not treat a viral infection such as the flu or a common cold.  Do not share this medicine with another person, even if they have the same symptoms you have.  This medicine can cause unusual results with certain medical tests. Tell any doctor who treats you that you are using amoxicillin.  Store at room temperature away from moisture, heat, and light.  You may store liquid amoxicillin in a refrigerator but do not allow it to freeze. Throw away any liquid amoxicillin that is not used within 14 days after it was mixed at the pharmacy.  What happens if I miss a dose?  Take the missed dose as soon as you remember. Skip the missed dose if it is almost time for your next  scheduled dose. Do not take extra medicine to make up the missed dose.  What happens if I overdose?  Seek emergency medical attention or call the Poison Help line at [phone].  Overdose symptoms may include confusion, behavior changes, a severe skin rash, urinating less than usual, or seizure (black-out or convulsions).  What should I avoid while taking amoxicillin?  Antibiotic medicines can cause diarrhea, which may be a sign of a new infection. If you have diarrhea that is watery or bloody, stop using amoxicillin and call your doctor. Do not use anti-diarrhea medicine unless your doctor tells you to.  What are the possible side effects of amoxicillin?  Get emergency medical help if you have any of these signs of an allergic reaction: hives; difficulty breathing; swelling of your face, lips, tongue, or throat.  Call your doctor at once if you have:  ?? diarrhea that is watery or bloody;  ?? fever, swollen gums, painful mouth sores, pain when swallowing, skin sores, cold or flu symptoms, cough, trouble breathing;  ?? swollen glands, rash or itching, joint pain, or general ill feeling;  ?? pale or yellowed skin, yellowing of the eyes, dark colored urine, fever, confusion or weakness;  ?? severe tingling, numbness, pain, muscle weakness;  ?? easy bruising, unusual bleeding (nose, mouth, vagina, or rectum), purple or red pinpoint spots under your skin; or  ?? severe skin reaction --fever, sore throat, swelling in your face or tongue, burning in your eyes, skin pain, followed by a red or purple skin rash that spreads (especially in the face or upper body) and causes blistering and peeling.  Common side effects may include:  ?? stomach pain, nausea, vomiting, diarrhea;  ?? vaginal itching or discharge;  ?? headache; or  ?? swollen, black, or "hairy" tongue.  This is not a complete list of side effects and others may occur. Call your doctor for medical advice about side effects. You may report side effects to FDA at  1-800-FDA-1088.  What other drugs will affect amoxicillin?  Other drugs may interact with amoxicillin, including prescription and over-the-counter medicines, vitamins, and herbal products. Tell each of your health care providers about all medicines you use now and any medicine you start or stop using.  Where can I get more information?  Your pharmacist can provide more information about amoxicillin.    Remember, keep this and all other medicines out of the reach of children, never share your medicines with others, and use this medication only for the indication prescribed.   Every effort has been made to ensure that the information provided by [employer] ('[employer]') is accurate, up-to-date, and complete, but no guarantee is made to that effect. Drug information contained herein may be time sensitive. [employer] information has been compiled for use by healthcare practitioners and consumers in the [place] and therefore [employer] does not warrant that uses outside of the [place] are appropriate, unless specifically indicated otherwise. [employer]'s drug information does not endorse drugs, diagnose patients or recommend therapy. [employer]'s drug information is an [employer], corporate to assist licensed healthcare practitioners in caring for their patients and/or to serve consumers viewing this service as a supplement to, and not a substitute for, the expertise, skill, knowledge and judgment of healthcare practitioners. The absence of a warning for a given drug or drug combination in no way should be construed to indicate that the drug or drug combination is safe, effective or appropriate for any given patient. [employer] does not assume any responsibility for any aspect of healthcare administered with the aid of information [employer] provides. The information contained herein is not intended to cover all possible uses, directions, precautions, warnings, drug interactions, allergic reactions, or adverse  effects. If you have questions about the drugs you are taking, check with your doctor, nurse or pharmacist.  Copyright [phone]) [phone] [employer] Version: 9.04. Revision date: 06/12/2014.  This information does not replace the advice of a doctor. [employer]wise, Incorporated disclaims any warranty or liability for your use of this information.   Content Version: 10.9.538570

## 2015-10-16 NOTE — Progress Notes (Signed)
Subjective:      Patient ID: Lisa FordyceLinda S York is a 64 y.o. female.  Chief Complaint   Patient presents with   ??? Otalgia     rt ear pain      Otalgia    There is pain in the right ear. This is a recurrent problem. The current episode started more than 1 month ago. The problem occurs constantly. The problem has been gradually worsening. There has been no fever. The pain is at a severity of 8/10. The pain is moderate. Associated symptoms include coughing and a sore throat. Pertinent negatives include no abdominal pain, diarrhea, ear discharge, headaches or rhinorrhea. Treatments tried: patient has been putting sweet oil in ear. The treatment provided no relief. There is no history of a chronic ear infection, hearing loss or a tympanostomy tube.       Review of Systems   Constitutional: Negative for appetite change, chills, fever and unexpected weight change.   HENT: Positive for ear pain and sore throat. Negative for ear discharge and rhinorrhea.    Eyes: Negative.    Respiratory: Positive for cough.    Cardiovascular: Negative.    Gastrointestinal: Negative for abdominal pain and diarrhea.   Endocrine: Negative.    Musculoskeletal: Negative.    Skin: Negative.    Allergic/Immunologic: Negative.    Neurological: Negative for dizziness and headaches.   Hematological: Negative.  Negative for adenopathy.   Psychiatric/Behavioral: Negative.        Objective:   Physical Exam   Constitutional: She is oriented to person, place, and time. Vital signs are normal. She appears well-developed and well-nourished. She does not have a sickly appearance. No distress.   HENT:   Head: Normocephalic and atraumatic.   Right Ear: Hearing, external ear and ear canal normal. Tympanic membrane is injected.   Left Ear: Hearing, tympanic membrane, external ear and ear canal normal.   Nose: Nose normal.   Mouth/Throat: Uvula is midline and mucous membranes are normal. Posterior oropharyngeal edema (right tonsil ) and posterior oropharyngeal erythema  present. No oropharyngeal exudate.   Eyes: Conjunctivae and EOM are normal. Pupils are equal, round, and reactive to light.   Neck: Normal range of motion. Neck supple.   Cardiovascular: Normal rate, regular rhythm, S1 normal, S2 normal and normal heart sounds.    Pulmonary/Chest: Effort normal and breath sounds normal. No accessory muscle usage. No respiratory distress.   Abdominal: Soft. Bowel sounds are normal.   Musculoskeletal: Normal range of motion.   Neurological: She is alert and oriented to person, place, and time.   Skin: Skin is warm, dry and intact.   Psychiatric: She has a normal mood and affect.   Vitals reviewed.    Visit Vitals   ??? BP 138/84   ??? Pulse 89   ??? Temp 98.7 ??F (37.1 ??C)   ??? Ht 5' 4.96" (1.65 m)   ??? Wt 192 lb (87.1 kg)   ??? SpO2 97%   ??? BMI 31.99 kg/m2     Assessment:      Lisa York was seen today for otalgia.    Diagnoses and all orders for this visit:    Pharyngitis, unspecified etiology: will tx. As below given duration of symptoms. Instructed to drink plenty of fluids, do warm salt water gargles 2-3 times per day and Patient to f/u if no better or worsening of symptoms.  -     amoxicillin (AMOXIL) 500 MG capsule; Take 1 capsule by mouth 2 times daily for  10 days    Chronic bronchitis, unspecified chronic bronchitis type (HCC): stable. Refill as below.   -     albuterol sulfate HFA (PROAIR HFA) 108 (90 BASE) MCG/ACT inhaler; Inhale 2 puffs into the lungs every 6 hours as needed for Wheezing

## 2015-10-29 ENCOUNTER — Encounter

## 2015-10-29 MED ORDER — HYDROCODONE-ACETAMINOPHEN 10-325 MG PO TABS
10-325 MG | ORAL_TABLET | Freq: Four times a day (QID) | ORAL | 0 refills | Status: DC | PRN
Start: 2015-10-29 — End: 2015-11-29

## 2015-10-29 NOTE — Telephone Encounter (Signed)
Controlled Substances Monitoring: Attestation: The Prescription Monitoring Report for this patient was reviewed today. (Amun Stemm D Kitt Minardi, CNP)  Documentation: No signs of potential drug abuse or diversion identified. (Sharley Keeler D Rossie Bretado, CNP)

## 2015-11-01 ENCOUNTER — Encounter: Attending: Family | Primary: Family Medicine

## 2015-11-05 ENCOUNTER — Ambulatory Visit: Admit: 2015-11-05 | Discharge: 2015-11-05 | Payer: MEDICARE | Attending: Family | Primary: Family Medicine

## 2015-11-05 DIAGNOSIS — K219 Gastro-esophageal reflux disease without esophagitis: Secondary | ICD-10-CM

## 2015-11-05 MED ORDER — FAMOTIDINE 20 MG PO TABS
20 MG | ORAL_TABLET | Freq: Two times a day (BID) | ORAL | 3 refills | Status: DC
Start: 2015-11-05 — End: 2016-04-01

## 2015-11-05 MED ORDER — CHOLESTYRAMINE LIGHT 4 G PO PACK
4 g | PACK | Freq: Two times a day (BID) | ORAL | 3 refills | Status: DC
Start: 2015-11-05 — End: 2016-01-17

## 2015-11-05 MED ORDER — ONDANSETRON HCL 4 MG PO TABS
4 MG | ORAL_TABLET | Freq: Three times a day (TID) | ORAL | 0 refills | Status: AC | PRN
Start: 2015-11-05 — End: 2015-11-15

## 2015-11-05 MED ORDER — PAROXETINE HCL 40 MG PO TABS
40 MG | ORAL_TABLET | Freq: Every day | ORAL | 5 refills | Status: DC
Start: 2015-11-05 — End: 2016-08-20

## 2015-11-05 NOTE — Progress Notes (Signed)
Subjective:      Patient ID: Lisa York is a 64 y.o. female.  Chief Complaint   Patient presents with   ??? Nausea     diarrhea, abdominal pain      Nausea & Vomiting   This is a chronic problem. The current episode started more than 1 year ago. Associated symptoms include abdominal pain and nausea. Pertinent negatives include no anorexia, chills, fever or vomiting. Associated symptoms comments: Diarrhea; bloating. Exacerbated by: eating aggravates diarrhea however patient reports no known aggravating factor for nausea. Treatments tried: omeprazole, imodium. The treatment provided mild relief.       Review of Systems   Constitutional: Negative for appetite change, chills and fever.   HENT: Negative.    Eyes: Negative.    Respiratory: Negative.    Gastrointestinal: Positive for abdominal pain, diarrhea and nausea. Negative for anorexia and vomiting.   Endocrine: Negative.    Genitourinary: Negative.    Musculoskeletal: Negative.    Skin: Negative.    Neurological: Negative.    Psychiatric/Behavioral: Negative.        Objective:   Physical Exam   Constitutional: She is oriented to person, place, and time. Vital signs are normal. She appears well-developed and well-nourished. She does not have a sickly appearance. No distress.   HENT:   Head: Normocephalic and atraumatic.   Eyes: Conjunctivae and EOM are normal. Pupils are equal, round, and reactive to light.   Neck: Normal range of motion. Neck supple.   Cardiovascular: Normal rate, regular rhythm and normal heart sounds.    Pulmonary/Chest: Effort normal and breath sounds normal. No accessory muscle usage. No respiratory distress.   Abdominal: Soft. Bowel sounds are normal. There is no hepatosplenomegaly. There is tenderness in the epigastric area.   Musculoskeletal: Normal range of motion.   Neurological: She is alert and oriented to person, place, and time.   Skin: Skin is warm, dry and intact.   Psychiatric: She has a normal mood and affect. Her speech is normal  and behavior is normal.   Vitals reviewed.    Visit Vitals   ??? BP 138/84   ??? Pulse 85   ??? Temp 98.3 ??F (36.8 ??C)   ??? Ht 5' 4.96" (1.65 m)   ??? Wt 191 lb (86.6 kg)   ??? SpO2 96%   ??? BMI 31.82 kg/m2       Assessment/Plan:          Lisa York was seen today for nausea.    Diagnoses and all orders for this visit:    Gastroesophageal reflux disease without esophagitis: epigastric pain and tenderness on exam otherwise abd exam is unremarkable. Order for labs as below. Will try switching PPI as below. Recommend patient f/u with GI as she will likely need EDG given chronic nausea and epigastric pain.   -     LIPASE  -     AMYLASE  -     COMPREHENSIVE METABOLIC PANEL  -     CBC Auto Differential  -     famotidine (PEPCID) 20 MG tablet; Take 1 tablet by mouth 2 times daily        -     External Referral To Gastroenterology  -     Cancel: omeprazole (PRILOSEC) 40 MG delayed release capsule; Take 1 capsule by mouth daily    Diarrhea, unspecified type: patient reports chronic diarrhea which has worsened since patient had cholecystectomy. Patient reports she had normal colonoscopy in January 2016. Will request  report.   -     cholestyramine light 4 G packet; Take 1 packet by mouth 2 times daily  -     External Referral To Gastroenterology    Anxiety: refill as below.   -     PARoxetine (PAXIL) 40 MG tablet; Take 1 tablet by mouth daily

## 2015-11-05 NOTE — Patient Instructions (Signed)
Gastroesophageal Reflux Disease (GERD): Care Instructions  Your Care Instructions     Gastroesophageal reflux disease (GERD) is the backward flow of stomach acid into the esophagus. The esophagus is the tube that leads from your throat to your stomach. A one-way valve prevents the stomach acid from moving up into this tube. When you have GERD, this valve does not close tightly enough.  If you have mild GERD symptoms including heartburn, you may be able to control the problem with antacids or over-the-counter medicine. Changing your diet, losing weight, and making other lifestyle changes can also help reduce symptoms.  Follow-up care is a key part of your treatment and safety. Be sure to make and go to all appointments, and call your doctor if you are having problems. It???s also a good idea to know your test results and keep a list of the medicines you take.  How can you care for yourself at home?  ?? Take your medicines exactly as prescribed. Call your doctor if you think you are having a problem with your medicine.  ?? Your doctor may recommend over-the-counter medicine. For mild or occasional indigestion, antacids, such as Tums, Gaviscon, Mylanta, or Maalox, may help. Your doctor also may recommend over-the-counter acid reducers, such as Pepcid AC, Tagamet HB, Zantac 75, or Prilosec. Read and follow all instructions on the label. If you use these medicines often, talk with your doctor.  ?? Change your eating habits.  ?? It???s best to eat several small meals instead of two or three large meals.  ?? After you eat, wait 2 to 3 hours before you lie down.  ?? Chocolate, mint, and alcohol can make GERD worse.  ?? Spicy foods, foods that have a lot of acid (like tomatoes and oranges), and coffee can make GERD symptoms worse in some people. If your symptoms are worse after you eat a certain food, you may want to stop eating that food to see if your symptoms get better.  ?? Do not smoke or chew tobacco. Smoking can make GERD  worse. If you need help quitting, talk to your doctor about stop-smoking programs and medicines. These can increase your chances of quitting for good.  ?? If you have GERD symptoms at night, raise the head of your bed 6 to 8 inches by putting the frame on blocks or placing a foam wedge under the head of your mattress. (Adding extra pillows does not work.)  ?? Do not wear tight clothing around your middle.  ?? Lose weight if you need to. Losing just 5 to 10 pounds can help.  When should you call for help?  Call your doctor now or seek immediate medical care if:  ?? You have new or different belly pain.  ?? Your stools are black and tarlike or have streaks of blood.  Watch closely for changes in your health, and be sure to contact your doctor if:  ?? Your symptoms have not improved after 2 days.  ?? Food seems to catch in your throat or chest.  Where can you learn more?  Go to [email or web address] and sign in to your MyChart account. Enter T927 in the Search Health Information box to learn more about ???Gastroesophageal Reflux Disease (GERD): Care Instructions.???    If you do not have an account, please click on the ???Sign Up Now??? link.  ?? 2006-2016 Healthwise, Incorporated. Care instructions adapted under license by  Health. This care instruction is for use with your licensed healthcare professional. If you have   questions about a medical condition or this instruction, always ask your healthcare professional. [place] any warranty or liability for your use of this information.  Content Version: 11.0.578772; Current as of: November 16, 2014        Upper GI Endoscopy: Before Your Procedure  What is an upper GI endoscopy?  An upper gastrointestinal (or GI) endoscopy is a test that allows your doctor to look at the inside of your esophagus, stomach, and the first part of your small intestine, called the duodenum. The esophagus is the tube that carries food to your stomach. The doctor  uses a thin, lighted tube that bends. It is called an endoscope, or scope.  The doctor puts the tip of the scope in your mouth and gently moves it down your throat. The scope is a flexible video camera. The doctor looks at a monitor (like a TV set or a computer screen) as he or she moves the scope. A doctor may do this test, which is also called a procedure, to look for ulcers, tumors, infection, or bleeding. It also can be used to look for signs of acid backing up into your esophagus. This is called gastroesophageal reflux disease, or GERD. The doctor can use the scope to take a sample of tissue for study (a biopsy). The doctor also can use the scope to take out growths or stop bleeding.  Follow-up care is a key part of your treatment and safety. Be sure to make and go to all appointments, and call your doctor if you are having problems. It's also a good idea to know your test results and keep a list of the medicines you take.  What happens before the procedure?  Procedures can be stressful. This information will help you understand what you can expect. And it will help you safely prepare for your procedure.  Preparing for the procedure  ?? Understand exactly what procedure is planned, along with the risks, benefits, and other options.  ?? Tell your doctors ALL the medicines, vitamins, supplements, and herbal remedies you take. Some of these can increase the risk of bleeding or interact with anesthesia.  ?? If you take blood thinners, such as warfarin (Coumadin), clopidogrel (Plavix), or aspirin, be sure to talk to your doctor. He or she will tell you if you should stop taking these medicines before your procedure. Make sure that you understand exactly what your doctor wants you to do.  ?? Your doctor will tell you which medicines to take or stop before your procedure. You may need to stop taking certain medicines a week or more before the procedure. So talk to your doctor as soon as you can.  ?? If you have an advance  directive, let your doctor know. It may include a living will and a durable power of attorney for health care. Bring a copy to the hospital. If you don't have one, you may want to prepare one. It lets your doctor and loved ones know your health care wishes. Doctors advise that everyone prepare these papers before any type of surgery or procedure.  What happens on the day of the procedure?  ?? Follow the instructions exactly about when to stop eating and drinking. If you don't, your procedure may be canceled. If your doctor told you to take your medicines on the day of the procedure, take them with only a sip of water.  ?? Take a bath or shower before you come in for your procedure. Do not  apply lotions, perfumes, deodorants, or nail polish.  ?? Take off all jewelry and piercings. And take out contact lenses, if you wear them.  At the hospital or surgery center  ?? Bring a picture ID.  ?? The test may take 15 to 30 minutes.  ?? The doctor may spray medicine on the back of your throat to numb it. You also will get medicine to prevent pain and to relax you.  ?? You will lie on your left side. The doctor will put the scope in your mouth and toward the back of your throat. The doctor will tell you when to swallow. This helps the scope move down your throat. You will be able to breathe normally. The doctor will move the scope down your esophagus into your stomach. The doctor also may look at the duodenum.  ?? If your doctor wants to take a sample of tissue for a biopsy, he or she may use small surgical tools, which are put into the scope, to cut off some tissue. You will not feel a biopsy, if one is taken. The doctor also can use the tools to stop bleeding or to do other treatments, if needed.  ?? You will stay at the hospital or surgery center for 1 to 2 hours until the medicine you were given wears off.  Going home  ?? Be sure you have someone to drive you home. Anesthesia and pain medicine make it unsafe for you to drive.  ?? You  will be given more specific instructions about recovering from your procedure. They will cover things like diet, wound care, follow-up care, driving, and getting back to your normal routine.  When should you call your doctor?  ?? You have questions or concerns.  ?? You don't understand how to prepare for your procedure.  ?? You become ill before the procedure (such as fever, flu, or a cold).  ?? You need to reschedule or have changed your mind about having the procedure.  Where can you learn more?  Go to [email or web address] and sign in to your MyChart account. Enter P790 in the Search Health Information box to learn more about ???Upper GI Endoscopy: Before Your Procedure.???    If you do not have an account, please click on the ???Sign Up Now??? link.  ?? 2006-2016 Healthwise, Incorporated. Care instructions adapted under license by [facility]. This care instruction is for use with your licensed healthcare professional. If you have questions about a medical condition or this instruction, always ask your healthcare professional. Healthwise, Incorporated disclaims any warranty or liability for your use of this information.  Content Version: 11.0.578772; Current as of: November 16, 2014        Diarrhea: Care Instructions  Your Care Instructions     Diarrhea is loose, watery stools (bowel movements). The exact cause is often hard to find. Sometimes diarrhea is your body's way of getting rid of what caused an upset stomach. Viruses, food poisoning, and many medicines can cause diarrhea. Some people get diarrhea in response to emotional stress, anxiety, or certain foods.  Almost everyone has diarrhea now and then. It usually isn't serious, and your stools will return to normal soon. The important thing to do is replace the fluids you have lost, so you can prevent dehydration.  The doctor has checked you carefully, but problems can develop later. If you notice any problems or new symptoms, get medical treatment  right away.  Follow-up care is a key part of your treatment  and safety. Be sure to make and go to all appointments, and call your doctor if you are having problems. It's also a good idea to know your test results and keep a list of the medicines you take.  How can you care for yourself at home?  ?? Watch for signs of dehydration, which means your body has lost too much water. Dehydration is a serious condition and should be treated right away. Signs of dehydration are:  ?? Increasing thirst and dry eyes and mouth.  ?? Feeling faint or lightheaded.  ?? Darker urine, and a smaller amount of urine than normal.  ?? To prevent dehydration, drink plenty of fluids, enough so that your urine is light yellow or clear like water. Choose water and other caffeine-free clear liquids until you feel better. If you have kidney, heart, or liver disease and have to limit fluids, talk with your doctor before you increase the amount of fluids you drink.  ?? Begin eating small amounts of mild foods the next day, if you feel like it.  ?? Try yogurt that has live cultures of Lactobacillus. (Check the label.)  ?? Avoid spicy foods, fruits, alcohol, and caffeine until 48 hours after all symptoms are gone.  ?? Avoid chewing gum that contains sorbitol.  ?? Avoid dairy products (except for yogurt with Lactobacillus) while you have diarrhea and for 3 days after symptoms are gone.  ?? The doctor may recommend that you take over-the-counter medicine, such as loperamide (Imodium), if you still have diarrhea after 6 hours. Read and follow all instructions on the label. Do not use this medicine if you have bloody diarrhea, a high fever, or other signs of serious illness. Call your doctor if you think you are having a problem with your medicine.  When should you call for help?  Call 911 anytime you think you may need emergency care. For example, call if:  ?? You passed out (lost consciousness).  ?? Your stools are maroon or very bloody.  Call your doctor now or  seek immediate medical care if:  ?? You are dizzy or lightheaded, or you feel like you may faint.  ?? Your stools are black and look like tar, or they have streaks of blood.  ?? You have new or worse belly pain.  ?? You have symptoms of dehydration, such as:  ?? Dry eyes and a dry mouth.  ?? Passing only a little dark urine.  ?? Feeling thirstier than usual.  ?? You have a new or higher fever.  Watch closely for changes in your health, and be sure to contact your doctor if:  ?? Your diarrhea is getting worse.  ?? You see pus in the diarrhea.  ?? You are not getting better after 2 days (48 hours).  Where can you learn more?  Go to [email or web address] and sign in to your MyChart account. Enter [phone] in the Search Health Information box to learn more about ???Diarrhea: Care Instructions.???    If you do not have an account, please click on the ???Sign Up Now??? link.  ?? 2006-2016 Healthwise, Incorporated. Care instructions adapted under license by [facility]. This care instruction is for use with your licensed healthcare professional. If you have questions about a medical condition or this instruction, always ask your healthcare professional. Healthwise, Incorporated disclaims any warranty or liability for your use of this information.  Content Version: 11.0.578772; Current as of: May 24, 2015        Nausea and Vomiting:  Care Instructions  Your Care Instructions     When you are nauseated, you may feel weak and sweaty and notice a lot of saliva in your mouth. Nausea often leads to vomiting. Most of the time you do not need to worry about nausea and vomiting, but they can be signs of other illnesses.  Two common causes of nausea and vomiting are stomach flu and food poisoning. Nausea and vomiting from viral stomach flu will usually start to improve within 24 hours. Nausea and vomiting from food poisoning may last from 12 to 48 hours.  The doctor has checked you carefully, but problems can develop later. If you notice  any problems or new symptoms, get medical treatment right away.  Follow-up care is a key part of your treatment and safety. Be sure to make and go to all appointments, and call your doctor if you are having problems. It's also a good idea to know your test results and keep a list of the medicines you take.  How can you care for yourself at home?  ?? To prevent dehydration, drink plenty of fluids, enough so that your urine is light yellow or clear like water. Choose water and other caffeine-free clear liquids until you feel better. If you have kidney, heart, or liver disease and have to limit fluids, talk with your doctor before you increase the amount of fluids you drink.  ?? Rest in bed until you feel better.  ?? When you are able to eat, try clear soups, mild foods, and liquids until all symptoms are gone for 12 to 48 hours. Other good choices include dry toast, crackers, cooked cereal, and gelatin dessert, such as Jell-O.  When should you call for help?  Call 911 anytime you think you may need emergency care. For example, call if:  ?? You passed out (lost consciousness).  Call your doctor now or seek immediate medical care if:  ?? You have symptoms of dehydration, such as:  ?? Dry eyes and a dry mouth.  ?? Passing only a little dark urine.  ?? Feeling thirstier than usual.  ?? You have new or worsening belly pain.  ?? You have a new or higher fever.  ?? You vomit blood or what looks like coffee grounds.  Watch closely for changes in your health, and be sure to contact your doctor if:  ?? You have ongoing nausea and vomiting.  ?? Your vomiting is getting worse.  ?? Your vomiting lasts longer than 2 days.  ?? You are not getting better as expected.  Where can you learn more?  Go to [email or web address] and sign in to your MyChart account. Enter H591 in the Search Health Information box to learn more about ???Nausea and Vomiting: Care Instructions.???    If you do not have an account, please click on the ???Sign Up  Now??? link.  ?? 2006-2016 Healthwise, Incorporated. Care instructions adapted under license by [facility]. This care instruction is for use with your licensed healthcare professional. If you have questions about a medical condition or this instruction, always ask your healthcare professional. Healthwise, Incorporated disclaims any warranty or liability for your use of this information.  Content Version: 11.0.578772; Current as of: May 24, 2015        Influenza (Flu) Vaccine (Inactivated or Recombinant): What You Need to Know  Why get vaccinated?  Influenza ("flu") is a contagious disease that spreads around the [place] every winter, usually between October and May.  Flu is caused  by influenza viruses and is spread mainly by coughing, sneezing, and close contact.  Anyone can get flu. Flu strikes suddenly and can last several days. Symptoms vary by age, but can include:  ?? Fever/chills.  ?? Sore throat.  ?? Muscle aches.  ?? Fatigue.  ?? Cough.  ?? Headache.  ?? Runny or stuffy nose.  Flu can also lead to pneumonia and blood infections, and cause diarrhea and seizures in children. If you have a medical condition, such as heart or lung disease, flu can make it worse.  Flu is more dangerous for some people. Infants and young children, people 55 years of age and older, pregnant women, and people with certain health conditions or a weakened immune system are at greatest risk.  Each year thousands of people in the [place] die from flu, and many more are hospitalized.  Flu vaccine can:   ?? Keep you from getting flu.  ?? Make flu less severe if you do get it.  ?? Keep you from spreading flu to your family and other people.  Inactivated and recombinant flu vaccines  A dose of flu vaccine is recommended every flu season. Children 6 months through 50 years of age may need two doses during the same flu season. Everyone else needs only one dose each flu season.  Some inactivated flu vaccines contain a very small amount of a  mercury-based preservative called thimerosal. Studies have not shown thimerosal in vaccines to be harmful, but flu vaccines that do not contain thimerosal are available.  There is no live flu virus in flu shots. They cannot cause the flu.  There are many flu viruses, and they are always changing. Each year a new flu vaccine is made to protect against three or four viruses that are likely to cause disease in the upcoming flu season. But even when the vaccine doesn't exactly match these viruses, it may still provide some protection.  Flu vaccine cannot prevent:  ?? Flu that is caused by a virus not covered by the vaccine.  ?? Illnesses that look like flu but are not.  Some people should not get this vaccine  Tell the person who is giving you the vaccine:  ?? If you have any severe (life-threatening) allergies. If you ever had a life-threatening allergic reaction after a dose of flu vaccine, or have a severe allergy to any part of this vaccine, you may be advised not to get vaccinated. Most, but not all, types of flu vaccine contain a small amount of egg protein.  ?? If you ever had Guillain-Barr?? syndrome (also called GBS) Some people with a history of GBS should not get this vaccine. This should be discussed with your doctor.  ?? If you are not feeling well. It is usually okay to get flu vaccine when you have a mild illness, but you might be asked to come back when you feel better.  Risks of a vaccine reaction  With any medicine, including vaccines, there is a chance of reactions. These are usually mild and go away on their own, but serious reactions are also possible.  Most people who get a flu shot do not have any problems with it.  Minor problems following a flu shot include:  ?? Soreness, redness, or swelling where the shot was given  ?? Hoarseness  ?? Sore, red or itchy eyes  ?? Cough  ?? Fever  ?? Aches  ?? Headache  ?? Itching  ?? Fatigue  If these problems occur,  they usually begin soon after the shot and last 1 or 2  days.  More serious problems following a flu shot can include the following:  ?? There may be a small increased risk of Guillain-Barr?? Syndrome (GBS) after inactivated flu vaccine. This risk has been estimated at 1 or 2 additional cases per million people vaccinated. This is much lower than the risk of severe complications from flu, which can be prevented by flu vaccine.  ?? Young children who get the flu shot along with pneumococcal vaccine (PCV13) and/or DTaP vaccine at the same time might be slightly more likely to have a seizure caused by fever. Ask your doctor for more information. Tell your doctor if a child who is getting flu vaccine has ever had a seizure  Problems that could happen after any injected vaccine:   ?? People sometimes faint after a medical procedure, including vaccination. Sitting or lying down for about 15 minutes can help prevent fainting, and injuries caused by a fall. Tell your doctor if you feel dizzy, or have vision changes or ringing in the ears.  ?? Some people get severe pain in the shoulder and have difficulty moving the arm where a shot was given. This happens very rarely.  ?? Any medication can cause a severe allergic reaction. Such reactions from a vaccine are very rare, estimated at about 1 in a million doses, and would happen within a few minutes to a few hours after the vaccination.  As with any medicine, there is a very remote chance of a vaccine causing a serious injury or death.  The safety of vaccines is always being monitored. For more information, visit: http://floyd.org/.  What if there is a serious reaction?  What should I look for?  ?? Look for anything that concerns you, such as signs of a severe allergic reaction, very high fever, or unusual behavior.  Signs of a severe allergic reaction can include hives, swelling of the face and throat, difficulty breathing, a fast heartbeat, dizziness, and weakness ??? usually within a few minutes to a few hours after the  vaccination.  What should I do?  ?? If you think it is a severe allergic reaction or other emergency that can't wait, call 9-1-1 and get the person to the nearest hospital. Otherwise, call your doctor.  ?? Reactions should be reported to the "Vaccine Adverse Event Reporting System" (VAERS). Your doctor should file this report, or you can do it yourself through the VAERS website at www.vaers.LAgents.no, or by calling 1-(209)819-7126.  VAERS does not give medical advice.  The National Vaccine Injury Compensation Program  The National Vaccine Injury Compensation Program (VICP) is a federal program that was created to compensate people who may have been injured by certain vaccines.  Persons who believe they may have been injured by a vaccine can learn about the program and about filing a claim by calling 1-206-568-8330 or visiting the VICP website at SpiritualWord.at. There is a time limit to file a claim for compensation.  How can I learn more?  ?? Ask your healthcare provider. He or she can give you the vaccine package insert or suggest other sources of information.  ?? Call your local or state health department.  ?? Contact the Centers for Disease Control and Prevention (CDC):  ?? Call 319-069-9032 (1-800-CDC-INFO) or  ?? Visit CDC's website at BiotechRoom.com.cy  Vaccine Information Statement  Inactivated Influenza Vaccine  08/03/2014)  42 [place]C. ?? [phone]  Department of Health and [employer]  for Disease Control and Prevention  Many Vaccine Information Statements are available in Spanish and other languages. See PromoAge.com.br.  Muchas hojas de informaci??n sobre vacunas est??n disponibles en espa??ol y en otros idiomas. Visite PromoAge.com.br.  Content Version: 11.0.578772          famotidine  Pronunciation:  fam OH ti deen  Brand:  Heartburn Relief, Pepcid, Pepcid AC, Pepcid AC Maximum Strength  What is famotidine?  Famotidine a histamine-2 blockers. Famotidine works by decreasing the  amount of acid the stomach produces.  Famotidine is used to treat and prevent ulcers in the stomach and intestines. It also treats conditions in which the stomach produces too much acid, such as Zollinger-Ellison syndrome. Famotidine also treats gastroesophageal reflux disease (GERD) and other conditions in which acid backs up from the stomach into the esophagus, causing heartburn.  Famotidine may also be used for purposes not listed in this medication guide.  What should I discuss with my healthcare provider before taking famotidine?  Heartburn is often confused with the first symptoms of a heart attack. Seek emergency medical attention if you have chest pain or pressure, pain spreading to your jaw or shoulder, nausea, sweating, and a general ill feeling.  You should not use this medicine if you are allergic to famotidine or similar medicines such as ranitidine (Zantac), cimetidine (Tagamet), or nizatidine (Axid).  Ask a doctor or pharmacist if it is safe for you to use famotidine if you have other medical conditions, especially:  ?? kidney disease;  ?? liver disease;  ?? cancer stomach; or  ?? a personal or family history of Long QT syndrome.  FDA pregnancy category B. Famotidine is not expected to harm an unborn baby. Do not use this medicine without a doctor's advice if you are pregnant.  Famotidine can pass into breast milk and may harm a nursing baby. You should not breast-feed while using this medicine.  How should I take famotidine?  Use exactly as directed on the label, or as prescribed by your doctor. Do not use in larger or smaller amounts or for longer than recommended.  Measure liquid medicine with the dosing syringe provided, or with a special dose-measuring spoon or medicine cup. If you do not have a dose-measuring device, ask your pharmacist for one.  Although most ulcers heal within 4 weeks of famotidine treatment, it may take up to 8 weeks of using this medicine before your ulcer heals. For best  results, keep using the medication as directed. Talk with your doctor if your symptoms do not improve after 6 weeks of treatment.  Famotidine may be only part of a complete program of treatment that also includes changes in diet or lifestyle habits. Follow your doctor's instructions very closely.  Store at room temperature away from moisture, heat, and light. Do not allow the liquid  medicine to freeze.  Throw away any unused famotidine liquid that is older than 30 days.  What happens if I miss a dose?  Take the missed dose as soon as you remember. Skip the missed dose if it is almost time for your next scheduled dose. Do not take extra medicine to make up the missed dose.  What happens if I overdose?  Seek emergency medical attention or call the Poison Help line at [phone].  What should I avoid while taking famotidine?  Avoid drinking alcohol. It can increase the risk of damage to your stomach.  Avoid taking cimetidine (Tagamet), ranitidine (Zantac), or nizatidine (Axid) while you are taking famotidine,  unless your doctor has told you to.  This medication may impair your thinking or reactions. Be careful if you drive or do anything that requires you to be alert.  What are the possible side effects of famotidine?  Get emergency medical help if you have any of these signs of an allergic reaction: hives; difficult breathing; swelling of your face, lips, tongue, or throat.  Stop using famotidine and call your doctor at once if you have:  ?? fast or pounding heartbeats with severe dizziness; or  ?? unexplained muscle pain, tenderness, or weakness especially if you also have fever, unusual tiredness, and dark colored urine.  Common side effects may include:  ?? headache;  ?? dizziness; or  ?? constipation or diarrhea.  This is not a complete list of side effects and others may occur. Call your doctor for medical advice about side effects. You may report side effects to FDA at 1-800-FDA-1088.  What other drugs will  affect famotidine?  Other drugs may interact with famotidine, including prescription and over-the-counter medicines, vitamins, and herbal products. Tell each of your health care providers about all medicines you use now and any medicine you start or stop using.  Where can I get more information?  Your pharmacist can provide more information about famotidine.    Remember, keep this and all other medicines out of the reach of children, never share your medicines with others, and use this medication only for the indication prescribed.   Every effort has been made to ensure that the information provided by [employer] ('[employer]') is accurate, up-to-date, and complete, but no guarantee is made to that effect. Drug information contained herein may be time sensitive. [employer] information has been compiled for use by healthcare practitioners and consumers in the [place] and therefore [employer] does not warrant that uses outside of the [place] are appropriate, unless specifically indicated otherwise. [employer]'s drug information does not endorse drugs, diagnose patients or recommend therapy. [employer]'s drug information is an Investment banker, corporateinformational resource designed to assist licensed healthcare practitioners in caring for their patients and/or to serve consumers viewing this service as a supplement to, and not a substitute for, the expertise, skill, knowledge and judgment of healthcare practitioners. The absence of a warning for a given drug or drug combination in no way should be construed to indicate that the drug or drug combination is safe, effective or appropriate for any given patient. [employer] does not assume any responsibility for any aspect of healthcare administered with the aid of information [employer] provides. The information contained herein is not intended to cover all possible uses, directions, precautions, warnings, drug interactions, allergic reactions, or adverse effects. If you have questions about the drugs  you are taking, check with your doctor, nurse or pharmacist.  Copyright [phone] [employer] Version: 13.01. Revision date: 08/07/2013.  This information does not replace the advice of a doctor. [employer] disclaims any warranty or liability for your use of this information.   Content Version: 11.0.578772          ondansetron (oral)  Pronunciation:  on DAN se tron  Brand:  Zofran, Zofran ODT, Zuplenz  What is ondansetron?  Ondansetron blocks the actions of chemicals in the body that can trigger nausea and vomiting.  Ondansetron is used to prevent nausea and vomiting that may be caused by surgery or by medicine to treat cancer (chemotherapy or radiation).  Ondansetron is not for preventing nausea or vomiting that is caused by factors other than cancer  treatment or surgery.  Ondansetron may be used for purposes not listed in this medication guide.  What should I discuss with my health care provider before taking ondansetron?  You should not use ondansetron if you are also using apomorphine (Apokyn).  To make sure ondansetron is safe for you, tell your doctor if you have:  ?? liver disease; or  ?? if you are allergic to medicines similar ondansetron (dolasetron, granisetron, palonosetron).  FDA pregnancy category B. Ondansetron is not expected to harm an unborn baby. Tell your doctor if you are pregnant or plan to become pregnant during treatment.  It is not known whether ondansetron passes into breast milk or if it could harm a nursing baby. Tell your doctor if you are breast-feeding a baby.  Ondansetron should not be given to a child younger than 65 years old.  Ondansetron orally disintegrating tablets may contain phenylalanine. Tell your doctor if you have phenylketonuria (PKU).  How should I take ondansetron?  Follow all directions on your prescription label. Do not take this medicine in larger or smaller amounts or for longer than recommended.  Ondansetron can be taken with or without food.  The  first dose of ondansetron is usually taken before the start of your surgery, chemotherapy, or radiation treatment. Follow your doctor's dosing instructions very carefully.  Take the ondansetron regular tablet  with a full glass of water.  To take the orally disintegrating tablet (Zofran ODT):  ?? Keep the tablet in its blister pack until you are ready to take it. Open the package and peel back the foil. Do not push a tablet through the foil or you may damage the tablet.  ?? Use dry hands to remove the tablet and place it in your mouth.  ?? Do not swallow the tablet whole. Allow it to dissolve in your mouth without chewing.  ?? Swallow several times as the tablet dissolves.  To use ondansetron oral soluble film (strip) (Zuplenz):  ?? Keep the strip in the foil pouch until you are ready to use the medicine.  ?? Using dry hands, remove the strip and place it on your tongue. It will begin to dissolve right away.  ?? Do not swallow the strip whole. Allow it to dissolve in your mouth without chewing.  ?? Swallow several times after the strip dissolves. If desired, you may drink liquid to help swallow the dissolved strip.  ?? Wash your hands after using Zuplenz.  Measure liquid medicine with the dosing syringe provided, or with a special dose-measuring spoon or medicine cup. If you do not have a dose-measuring device, ask your pharmacist for one.  Store at room temperature away from moisture, heat, and light.  What happens if I miss a dose?  Take the missed dose as soon as you remember. Skip the missed dose if it is almost time for your next scheduled dose. Do not  take extra medicine to make up the missed dose.  What happens if I overdose?  Seek emergency medical attention or call the Poison Help line at [phone].  Overdose symptoms may include sudden loss of vision, severe constipation, feeling light-headed, or fainting.  What should I avoid while taking ondansetron?  Ondansetron may impair your thinking or reactions. Be  careful if you drive or do anything that requires you to be alert.  What are the possible side effects of ondansetron?  Get emergency medical help if you have any of these signs of an allergic reaction: rash, hives; fever, chills,  difficult breathing; swelling of your face, lips, tongue, or throat.  Call your doctor at once if you have:  ?? fast or pounding heartbeats;  ?? jaundice (yellowing of the skin or eyes);  ?? blurred vision or temporary vision loss (lasting from only a few minutes to several hours); or  ?? high levels of serotonin in the body --agitation, hallucinations, fever, fast heart rate, overactive reflexes, nausea, vomiting, diarrhea, loss of coordination, fainting.  Common side effects may include:  ?? diarrhea or constipation;  ?? headache;  ?? drowsiness; or  ?? tired feeling.  This is not a complete list of side effects and others may occur. Call your doctor for medical advice about side effects. You may report side effects to FDA at 1-800-FDA-1088.  What other drugs will affect ondansetron?  There are many other medicines that can increase your risk of heart rhythm problems if you use them together with ondansetron.   Tell your doctor about all medicines you use, and those you start or stop using during your treatment with ondansetron, especially:  ?? anagrelide;  ?? droperidol;  ?? methadone;  ?? an antibiotic --azithromycin, clarithromycin, erythromycin, levofloxacin, moxifloxacin, pentamidine;  ?? cancer medicine --arsenic trioxide, vandetanib;  ?? an antidepressant --citalopram, escitalopram;  ?? anti-malaria medication --chloroquine, halofantrine;  ?? heart rhythm medicine --amiodarone, disopyramide, dofetilide, dronedarone, flecainide, ibutilide, quinidine, sotalol; or  ?? medicine to treat a psychiatric disorder --chlorpromazine, haloperidol, pimozide, thioridazine.  This list is not complete. Other drugs may interact with ondansetron, including prescription and over-the-counter medicines, vitamins, and  herbal products. Not all possible interactions are listed in this medication guide.  Where can I get more information?  Your pharmacist can provide more information about ondansetron.    Remember, keep this and all other medicines out of the reach of children, never share your medicines with others, and use this medication only for the indication prescribed.   Every effort has been made to ensure that the information provided by [employer] ('[employer]') is accurate, up-to-date, and complete, but no guarantee is made to that effect. Drug information contained herein may be time sensitive. [employer] information has been compiled for use by healthcare practitioners and consumers in the [place] and therefore [employer] does not warrant that uses outside of the [place] are appropriate, unless specifically indicated otherwise. [employer]'s drug information does not endorse drugs, diagnose patients or recommend therapy. [employer]'s drug information is an Investment banker, corporate to assist licensed healthcare practitioners in caring for their patients and/or to serve consumers viewing this service as a supplement to, and not a substitute for, the expertise, skill, knowledge and judgment of healthcare practitioners. The absence of a warning for a given drug or drug combination in no way should be construed to indicate that the drug or drug combination is safe, effective or appropriate for any given patient. [employer] does not assume any responsibility for any aspect of healthcare administered with the aid of information [employer] provides. The information contained herein is not intended to cover all possible uses, directions, precautions, warnings, drug interactions, allergic reactions, or adverse effects. If you have questions about the drugs you are taking, check with your doctor, nurse or pharmacist.  Copyright [phone] [employer] Version: 11.02. Revision date: 10/24/2013.  This information does not  replace the advice of a doctor. [employer] disclaims any warranty or liability for your use of this information.   Content Version: 11.0.578772

## 2015-11-06 LAB — COMPREHENSIVE METABOLIC PANEL
ALT: 19 U/L (ref 10–40)
AST: 16 U/L (ref 15–37)
Albumin/Globulin Ratio: 1.7 (ref 1.1–2.2)
Albumin: 4.7 g/dL (ref 3.4–5.0)
Alkaline Phosphatase: 83 U/L (ref 40–129)
Anion Gap: 15 (ref 3–16)
BUN: 9 mg/dL (ref 7–20)
CO2: 26 mmol/L (ref 21–32)
Calcium: 9.9 mg/dL (ref 8.3–10.6)
Chloride: 101 mmol/L (ref 99–110)
Creatinine: 0.6 mg/dL (ref 0.6–1.2)
GFR African American: >60 (ref >60)
GFR Non-African American: >60 (ref >60)
Globulin: 2.7 g/dL
Glucose: 105 mg/dL — ABNORMAL HIGH (ref 70–99)
Potassium: 4.9 mmol/L (ref 3.5–5.1)
Sodium: 142 mmol/L (ref 136–145)
Total Bilirubin: 0.4 mg/dL (ref 0.0–1.0)
Total Protein: 7.4 g/dL (ref 6.4–8.2)

## 2015-11-06 LAB — CBC WITH AUTO DIFFERENTIAL
Basophils %: 0.8 %
Basophils Absolute: 0.1 10*3/uL (ref 0.0–0.2)
Eosinophils %: 2.2 %
Eosinophils Absolute: 0.2 10*3/uL (ref 0.0–0.6)
Hematocrit: 43.1 % (ref 36.0–48.0)
Hemoglobin: 14.6 g/dL (ref 12.0–16.0)
Lymphocytes %: 41.7 %
Lymphocytes Absolute: 4.2 10*3/uL (ref 1.0–5.1)
MCH: 30.9 pg (ref 26.0–34.0)
MCHC: 33.8 g/dL (ref 31.0–36.0)
MCV: 91.4 fL (ref 80.0–100.0)
MPV: 8.1 fL (ref 5.0–10.5)
Monocytes %: 3.8 %
Monocytes Absolute: 0.4 10*3/uL (ref 0.0–1.3)
Neutrophils %: 51.5 %
Neutrophils Absolute: 5.2 10*3/uL (ref 1.7–7.7)
Platelets: 279 10*3/uL (ref 135–450)
RBC: 4.72 M/uL (ref 4.00–5.20)
RDW: 13.2 % (ref 12.4–15.4)
WBC: 10.1 10*3/uL (ref 4.0–11.0)

## 2015-11-06 LAB — AMYLASE: Amylase: 32 U/L (ref 25–115)

## 2015-11-06 LAB — LIPASE: Lipase: 32 U/L (ref 13.0–60.0)

## 2015-11-29 ENCOUNTER — Encounter

## 2015-11-29 MED ORDER — HYDROCODONE-ACETAMINOPHEN 10-325 MG PO TABS
10-325 MG | ORAL_TABLET | Freq: Four times a day (QID) | ORAL | 0 refills | Status: DC | PRN
Start: 2015-11-29 — End: 2016-01-01

## 2015-11-29 NOTE — Telephone Encounter (Signed)
Patient requesting refill for Norco 10-325  will pick up at the office  No future appointment scheduled  CB  [phone]

## 2016-01-01 ENCOUNTER — Encounter

## 2016-01-02 MED ORDER — HYDROCODONE-ACETAMINOPHEN 10-325 MG PO TABS
10-325 MG | ORAL_TABLET | Freq: Four times a day (QID) | ORAL | 0 refills | Status: DC | PRN
Start: 2016-01-02 — End: 2016-01-31

## 2016-01-16 NOTE — Telephone Encounter (Signed)
Pt is requesting an AB again.   Her sxs are cough,sweats,headache,nauseated from the OTC med she's taking (Alka Seltzer Cold and Cough).   She's had the sxs for a week.   No fever.   Uses [facility].

## 2016-01-16 NOTE — Telephone Encounter (Signed)
Please have patient make an appointment.

## 2016-01-16 NOTE — Telephone Encounter (Signed)
LMOVM to call office and make an appt.

## 2016-01-17 ENCOUNTER — Ambulatory Visit: Admit: 2016-01-17 | Discharge: 2016-01-17 | Payer: MEDICARE | Attending: Family | Primary: Family Medicine

## 2016-01-17 DIAGNOSIS — J40 Bronchitis, not specified as acute or chronic: Secondary | ICD-10-CM

## 2016-01-17 LAB — POCT INFLUENZA A/B
Influenza A Ab: NEGATIVE
Influenza B Ab: NEGATIVE

## 2016-01-17 MED ORDER — CLARITHROMYCIN 500 MG PO TABS
500 MG | ORAL_TABLET | Freq: Two times a day (BID) | ORAL | 0 refills | Status: AC
Start: 2016-01-17 — End: 2016-01-24

## 2016-01-17 NOTE — Progress Notes (Signed)
Subjective:      Patient ID: Lisa York is a 65 y.o. female.  Chief Complaint   Patient presents with   ??? Chest Congestion     cough, fever, shortness of breath for 1 week      HPI Comments: Subjective:     Lisa York is a 65 y.o. female who presents for evaluation of fever. She has had the fever for 1 week - abruptly. Symptoms have been gradually worsening. Symptoms are described as suspected fevers but not measured at home, chills, sweats, cough, maylgia, and sob are same all the time. Associated symptoms are nausea, poor appetite and headache. Patient denies abdominal pain, diarrhea, otitis symptoms and vomiting.  She has tried to alleviate the symptoms with rest and alka seltzer for flu and cold with minimal relief. The patient has no known comorbidities (structural heart/valvular disease, prosthetic joints, immunocompromised state, recent dental work, known abscesses).          Review of Systems   Constitutional: Positive for appetite change, chills, fatigue and fever.   HENT: Positive for congestion. Negative for sore throat.    Eyes: Negative.    Respiratory: Positive for cough.    Cardiovascular: Negative.    Gastrointestinal: Positive for nausea. Negative for constipation and diarrhea.   Endocrine: Negative.    Genitourinary: Negative.    Musculoskeletal: Positive for myalgias.   Allergic/Immunologic: Negative.    Neurological: Positive for headaches. Negative for dizziness.       Objective:   Physical Exam   Constitutional: She is oriented to person, place, and time. She appears well-developed and well-nourished.   HENT:   Head: Normocephalic and atraumatic.   Eyes: Conjunctivae and EOM are normal. Pupils are equal, round, and reactive to light.   Neck: Normal range of motion. Neck supple.   Cardiovascular: Normal rate, regular rhythm and normal heart sounds.    Pulmonary/Chest: Effort normal. No accessory muscle usage. No respiratory distress. She has no decreased breath sounds. She has wheezes (mild  upper respiratory expiratory wheezing). She has no rhonchi. She has no rales.   Abdominal: Soft. Bowel sounds are normal.   Musculoskeletal: Normal range of motion.   Lymphadenopathy:     She has no cervical adenopathy.   Neurological: She is alert and oriented to person, place, and time.   Skin: Skin is warm, dry and intact.   Psychiatric: She has a normal mood and affect.   Vitals reviewed.    Visit Vitals   ??? BP 130/78   ??? Pulse 88   ??? Temp 99.5 ??F (37.5 ??C)   ??? Ht 5' 4.96" (1.65 m)   ??? Wt 190 lb 14.7 oz (86.6 kg)   ??? SpO2 97%   ??? BMI 31.81 kg/m2       Assessment/Plan:      [name] was seen today for chest congestion.    Diagnoses and all orders for this visit:    Bronchitis: Patient present with cough, congestion, HA, myalgias and fever X 1 week with rapid onset. patient present with more like the flu symptoms however the rapid influenza is negative. ? False negative. Patient does smoke and has previously had bronchitis. Mild expiratory wheeze in upper anterior fields other lungs are cta. Other consider includes pneumonia. Will tx. As below and send patient for chest x-ray. Encouraged to drink plenty of fluids, take all doses of antibiotics and Patient to f/u if no better or worsening of symptoms.  -  clarithromycin (BIAXIN) 500 MG tablet; Take 1 tablet by mouth 2 times daily for 7 days    Fever, unspecified fever cause: see note above.   -     POCT Influenza A/B    Cough: instructed to take otc mucinex. Also see note above.   -     XR Chest PA and Lateral; Future  -     POCT Influenza A/B

## 2016-01-17 NOTE — Patient Instructions (Addendum)
Recommend you drink plenty of fluids, take otc mucinex, take Albuterol inhaler every 4-6 hours for wheezing or shortnes of breath, take all doses of antibiotics, chest x-ray today and Patient to f/u if no better or worsening of symptoms.    Should have difficulty breathing I recommend to go the emergency department.     Cough: Care Instructions  Your Care Instructions  A cough is your body's response to something that bothers your throat or airways. Many things can cause a cough. You might cough because of a cold or the flu, bronchitis, or asthma. Smoking, postnasal drip, allergies, and stomach acid that backs up into your throat also can cause coughs.  A cough is a symptom, not a disease. Most coughs stop when the cause, such as a cold, goes away. You can take a few steps at home to cough less and feel better.  Follow-up care is a key part of your treatment and safety. Be sure to make and go to all appointments, and call your doctor if you are having problems. It's also a good idea to know your test results and keep a list of the medicines you take.  How can you care for yourself at home?  ?? Drink lots of water and other fluids. This helps thin the mucus and soothes a dry or sore throat. Honey or lemon juice in hot water or tea may ease a dry cough.  ?? Take cough medicine as directed by your doctor.  ?? Prop up your head on pillows to help you breathe and ease a dry cough.  ?? Try cough drops to soothe a dry or sore throat. Cough drops don't stop a cough. Medicine-flavored cough drops are no better than candy-flavored drops or hard candy.  ?? Do not smoke. Avoid secondhand smoke. If you need help quitting, talk to your doctor about stop-smoking programs and medicines. These can increase your chances of quitting for good.  When should you call for help?  Call 911 anytime you think you may need emergency care. For example, call if:  ?? You have severe trouble breathing.  Call your doctor now or seek immediate medical  care if:  ?? You cough up blood.  ?? You have new or worse trouble breathing.  ?? You have a new or higher fever.  ?? You have a new rash.  Watch closely for changes in your health, and be sure to contact your doctor if:  ?? You cough more deeply or more often, especially if you notice more mucus or a change in the color of your mucus.  ?? You have new symptoms, such as a sore throat, an earache, or sinus pain.  ?? You do not get better as expected.  Where can you learn more?  Go to [email or web address] and sign in to your MyChart account. Enter D279 in the Search Health Information box to learn more about ???Cough: Care Instructions.???    If you do not have an account, please click on the ???Sign Up Now??? link.  ?? 2006-2016 [employer]. Care instructions adapted under license by [facility] This care instruction is for use with your licensed healthcare professional. If you have questions about a medical condition or this instruction, always ask your healthcare professional. [employer] disclaims any warranty or liability for your use of this information.  Content Version: 11.0.578772; Current as of: May 24, 2015        Learning About Fever  What is a fever?  A fever is a high body temperature. It's one way your body fights being sick. A fever shows that the body is responding to infection or other illnesses, both minor and severe.  A fever is a symptom, not an illness by itself. A fever can be a sign that you are ill, but most fevers are not caused by a serious problem.  You may have a fever with a minor illness, such as a cold. But sometimes a very serious infection may cause little or no fever. It is important to look at other symptoms, other conditions you have, and how you feel in general. In children, notice how they act and see what symptoms they complain of.  What is a normal body temperature?  A normal body temperature is about 98.6??F. Some people have a normal  temperature that is a little higher or a little lower than this.  Your temperature may be a little lower in the morning than it is later in the day. It may go up during hot weather or when you exercise, wear heavy clothes, or take a hot bath.  Your temperature may also be different depending on how you take it. A temperature taken in the mouth (oral) or under the arm may be a little lower than your core temperature (rectal).  What is a fever temperature?  A core temperature of 100.4??F or above is considered a fever.  What can cause a fever?  A fever may be caused by:  ?? Infections. This is the most common cause of a fever. Examples of infections that can cause a fever include the flu, a kidney infection, or pneumonia.  ?? Some medicines.  ?? Severe trauma or injury, such as a heart attack, stroke, heatstroke, or burns.  ?? Other medical conditions, such as arthritis and some cancers.  How can you treat a fever at home?  ?? Ask your doctor if you can take an over-the-counter pain medicine, such as acetaminophen (Tylenol), ibuprofen (Advil, Motrin), or naproxen (Aleve). Be safe with medicines. Read and follow all instructions on the label.  ?? To prevent dehydration, drink plenty of fluids. Choose water and other caffeine-free clear liquids until you feel better. If you have kidney, heart, or liver disease and have to limit fluids, talk with your doctor before you increase the amount of fluids you drink.  Follow-up care is a key part of your treatment and safety. Be sure to make and go to all appointments, and call your doctor if you are having problems. It's also a good idea to know your test results and keep a list of the medicines you take.  Where can you learn more?  Go to [email or web address] and sign in to your MyChart account. Enter [phone] in the Search Health Information box to learn more about ???Learning About Fever.???    If you do not have an account, please click on the ???Sign Up Now??? link.  ??  2006-2016 [employer]. Care instructions adapted under license by [facility]. This care instruction is for use with your licensed healthcare professional. If you have questions about a medical condition or this instruction, always ask your healthcare professional. [employer], [employer] disclaims any warranty or liability for your use of this information.  Content Version: 11.0.578772; Current as of: May 24, 2015        Bronchitis: Care Instructions  Your Care Instructions     Bronchitis is inflammation of the bronchial tubes, which carry air to the lungs. The tubes swell  and produce mucus, or phlegm. The mucus and inflamed bronchial tubes make you cough. You may have trouble breathing.  Most cases of bronchitis are caused by viruses like those that cause colds. Antibiotics usually do not help and they may be harmful.  Bronchitis usually develops rapidly and lasts about 2 to 3 weeks in otherwise healthy people.  Follow-up care is a key part of your treatment and safety. Be sure to make and go to all appointments, and call your doctor if you are having problems. It's also a good idea to know your test results and keep a list of the medicines you take.  How can you care for yourself at home?  ?? Take all medicines exactly as prescribed. Call your doctor if you think you are having a problem with your medicine.  ?? Get some extra rest.  ?? Take an over-the-counter pain medicine, such as acetaminophen (Tylenol), ibuprofen (Advil, Motrin), or naproxen (Aleve) to reduce fever and relieve body aches. Read and follow all instructions on the label.  ?? Do not take two or more pain medicines at the same time unless the doctor told you to. Many pain medicines have acetaminophen, which is Tylenol. Too much acetaminophen (Tylenol) can be harmful.  ?? Take an over-the-counter cough medicine that contains dextromethorphan to help quiet a dry, hacking cough so that you can sleep. Avoid cough medicines that have more  than one active ingredient. Read and follow all instructions on the label.  ?? Breathe moist air from a humidifier, hot shower, or sink filled with hot water. The heat and moisture will thin mucus so you can cough it out.  ?? Do not smoke. Smoking can make bronchitis worse. If you need help quitting, talk to your doctor about stop-smoking programs and medicines. These can increase your chances of quitting for good.  When should you call for help?  Call 911 anytime you think you may need emergency care. For example, call if:  ?? You have severe trouble breathing.  Call your doctor now or seek immediate medical care if:  ?? You have new or worse trouble breathing.  ?? You cough up dark brown or bloody mucus (sputum).  ?? You have a new or higher fever.  ?? You have a new rash.  Watch closely for changes in your health, and be sure to contact your doctor if:  ?? You cough more deeply or more often, especially if you notice more mucus or a change in the color of your mucus.  ?? You are not getting better as expected.  Where can you learn more?  Go to [email or web address] and sign in to your MyChart account. Enter H333 in the Search Health Information box to learn more about ???Bronchitis: Care Instructions.???    If you do not have an account, please click on the ???Sign Up Now??? link.  ?? 2006-2016 [employer]. Care instructions adapted under license by [facility]. This care instruction is for use with your licensed healthcare professional. If you have questions about a medical condition or this instruction, always ask your healthcare professional. [employer] disclaims any warranty or liability for your use of this information.  Content Version: 11.0.578772; Current as of: May 20, 2015        Cough: Care Instructions  Your Care Instructions  A cough is your body's response to something that bothers your throat or airways. Many things can cause a cough. You might cough because of a cold or  the flu, bronchitis,  or asthma. Smoking, postnasal drip, allergies, and stomach acid that backs up into your throat also can cause coughs.  A cough is a symptom, not a disease. Most coughs stop when the cause, such as a cold, goes away. You can take a few steps at home to cough less and feel better.  Follow-up care is a key part of your treatment and safety. Be sure to make and go to all appointments, and call your doctor if you are having problems. It's also a good idea to know your test results and keep a list of the medicines you take.  How can you care for yourself at home?  ?? Drink lots of water and other fluids. This helps thin the mucus and soothes a dry or sore throat. Honey or lemon juice in hot water or tea may ease a dry cough.  ?? Take cough medicine as directed by your doctor.  ?? Prop up your head on pillows to help you breathe and ease a dry cough.  ?? Try cough drops to soothe a dry or sore throat. Cough drops don't stop a cough. Medicine-flavored cough drops are no better than candy-flavored drops or hard candy.  ?? Do not smoke. Avoid secondhand smoke. If you need help quitting, talk to your doctor about stop-smoking programs and medicines. These can increase your chances of quitting for good.  When should you call for help?  Call 911 anytime you think you may need emergency care. For example, call if:  ?? You have severe trouble breathing.  Call your doctor now or seek immediate medical care if:  ?? You cough up blood.  ?? You have new or worse trouble breathing.  ?? You have a new or higher fever.  ?? You have a new rash.  Watch closely for changes in your health, and be sure to contact your doctor if:  ?? You cough more deeply or more often, especially if you notice more mucus or a change in the color of your mucus.  ?? You have new symptoms, such as a sore throat, an earache, or sinus pain.  ?? You do not get better as expected.  Where can you learn more?  Go to [email or web address] and  sign in to your MyChart account. Enter D279 in the Search Health Information box to learn more about ???Cough: Care Instructions.???    If you do not have an account, please click on the ???Sign Up Now??? link.  ?? 2006-2016 [employer]. Care instructions adapted under license by [facility]. This care instruction is for use with your licensed healthcare professional. If you have questions about a medical condition or this instruction, always ask your healthcare professional. [employer] disclaims any warranty or liability for your use of this information.  Content Version: 11.0.578772; Current as of: May 24, 2015        Learning About Fever  What is a fever?     A fever is a high body temperature. It's one way your body fights being sick. A fever shows that the body is responding to infection or other illnesses, both minor and severe.  A fever is a symptom, not an illness by itself. A fever can be a sign that you are ill, but most fevers are not caused by a serious problem.  You may have a fever with a minor illness, such as a cold. But sometimes a very serious infection may cause little or no fever. It is important to look at other  symptoms, other conditions you have, and how you feel in general. In children, notice how they act and see what symptoms they complain of.  What is a normal body temperature?  A normal body temperature is about 98.6??F. Some people have a normal temperature that is a little higher or a little lower than this.  Your temperature may be a little lower in the morning than it is later in the day. It may go up during hot weather or when you exercise, wear heavy clothes, or take a hot bath.  Your temperature may also be different depending on how you take it. A temperature taken in the mouth (oral) or under the arm may be a little lower than your core temperature (rectal).  What is a fever temperature?  A core temperature of 100.4??F or above is considered a fever.  What can  cause a fever?  A fever may be caused by:  ?? Infections. This is the most common cause of a fever. Examples of infections that can cause a fever include the flu, a kidney infection, or pneumonia.  ?? Some medicines.  ?? Severe trauma or injury, such as a heart attack, stroke, heatstroke, or burns.  ?? Other medical conditions, such as arthritis and some cancers.  How can you treat a fever at home?  ?? Ask your doctor if you can take an over-the-counter pain medicine, such as acetaminophen (Tylenol), ibuprofen (Advil, Motrin), or naproxen (Aleve). Be safe with medicines. Read and follow all instructions on the label.  ?? To prevent dehydration, drink plenty of fluids. Choose water and other caffeine-free clear liquids until you feel better. If you have kidney, heart, or liver disease and have to limit fluids, talk with your doctor before you increase the amount of fluids you drink.  Follow-up care is a key part of your treatment and safety. Be sure to make and go to all appointments, and call your doctor if you are having problems. It's also a good idea to know your test results and keep a list of the medicines you take.  Where can you learn more?  Go to [email or web address] and sign in to your MyChart account. Enter [phone] in the Search Health Information box to learn more about ???Learning About Fever.???    If you do not have an account, please click on the ???Sign Up Now??? link.  ?? 2006-2016 [employer]. Care instructions adapted under license by [facility]. This care instruction is for use with your licensed healthcare professional. If you have questions about a medical condition or this instruction, always ask your healthcare professional. [employer] disclaims any warranty or liability for your use of this information.  Content Version: 11.0.578772; Current as of: May 24, 2015        Pneumonia: Care Instructions  Your Care Instructions     Pneumonia is an infection of the  lungs. Most cases are caused by infections from bacteria or viruses.  Pneumonia may be mild or very severe. If it is caused by bacteria, you will be treated with antibiotics. It may take a few weeks to a few months to recover fully from pneumonia, depending on how sick you were and whether your overall health is good.  Follow-up care is a key part of your treatment and safety. Be sure to make and go to all appointments, and call your doctor if you are having problems. It???s also a good idea to know your test results and keep a list of the medicines  you take.  How can you care for yourself at home?  ?? Take your antibiotics exactly as directed. Do not stop taking the medicine just because you are feeling better. You need to take the full course of antibiotics.  ?? Take your medicines exactly as prescribed. Call your doctor if you think you are having a problem with your medicine.  ?? Get plenty of rest and sleep. You may feel weak and tired for a while, but your energy level will improve with time.  ?? To prevent dehydration, drink plenty of fluids, enough so that your urine is light yellow or clear like water. Choose water and other caffeine-free clear liquids until you feel better. If you have kidney, heart, or liver disease and have to limit fluids, talk with your doctor before you increase the amount of fluids you drink.  ?? Take care of your cough so you can rest. A cough that brings up mucus from your lungs is common with pneumonia. It is one way your body gets rid of the infection. But if coughing keeps you from resting or causes severe fatigue and chest-wall pain, talk to your doctor. He or she may suggest that you take a medicine to reduce the cough.  ?? Use a vaporizer or humidifier to add moisture to your bedroom. Follow the directions for cleaning the machine.  ?? Do not smoke or allow others to smoke around you. Smoke will make your cough last longer. If you need help quitting, talk to your doctor about  stop-smoking programs and medicines. These can increase your chances of quitting for good.  ?? Take an over-the-counter pain medicine, such as acetaminophen (Tylenol), ibuprofen (Advil, Motrin), or naproxen (Aleve). Read and follow all instructions on the label.  ?? Do not take two or more pain medicines at the same time unless the doctor told you to. Many pain medicines have acetaminophen, which is Tylenol. Too much acetaminophen (Tylenol) can be harmful.  ?? If you were given a spirometer to measure how well your lungs are working, use it as instructed. This can help your doctor tell how your recovery is going.  ?? To prevent pneumonia in the future, talk to your doctor about getting a flu vaccine (once a year) and a pneumococcal vaccine (one time only for most people).  When should you call for help?  Call 911 anytime you think you may need emergency care. For example, call if:  ?? You have severe trouble breathing.  Call your doctor now or seek immediate medical care if:  ?? You cough up dark brown or bloody mucus (sputum).  ?? You have new or worse trouble breathing.  ?? You are dizzy or lightheaded, or you feel like you may faint.  Watch closely for changes in your health, and be sure to contact your doctor if:  ?? You have a new or higher fever.  ?? You are coughing more deeply or more often.  ?? You are not getting better after 2 days (48 hours).  ?? You do not get better as expected.  Where can you learn more?  Go to [email or web address] and sign in to your MyChart account. Enter D336 in the Search Health Information box to learn more about ???Pneumonia: Care Instructions.???    If you do not have an account, please click on the ???Sign Up Now??? link.  ?? 2006-2016 [employer]. Care instructions adapted under license by [facility]. This care instruction is for use with your licensed healthcare professional.  If you have questions about a medical condition or this instruction, always ask your  healthcare professional. [employer] disclaims any warranty or liability for your use of this information.  Content Version: 11.0.578772; Current as of: May 20, 2015

## 2016-01-20 NOTE — Telephone Encounter (Signed)
Biaxin can increase affects of the ambien. Recommend reducing the dose. Take 1/2 tab.

## 2016-01-20 NOTE — Telephone Encounter (Signed)
Pt called and is wanting to know if she can take the Biaxin and the Ambien together?

## 2016-01-21 NOTE — Telephone Encounter (Signed)
Pt informed

## 2016-01-31 ENCOUNTER — Encounter

## 2016-01-31 MED ORDER — HYDROCODONE-ACETAMINOPHEN 10-325 MG PO TABS
10-325 MG | ORAL_TABLET | Freq: Four times a day (QID) | ORAL | 0 refills | Status: DC | PRN
Start: 2016-01-31 — End: 2016-02-26

## 2016-01-31 NOTE — Telephone Encounter (Signed)
Last Seen:  01/17/2016  [facility] Ran and in chart for your review  No future appt scheduled

## 2016-02-18 NOTE — Telephone Encounter (Signed)
Patient will need to make an appointment.

## 2016-02-18 NOTE — Telephone Encounter (Signed)
Pt informed scheduled appt for 2/23

## 2016-02-18 NOTE — Telephone Encounter (Signed)
Pt w/ left breast soreness, feels like there is something in there but not really a knot, she called [facility] they told her to call you to see what you would want to order

## 2016-02-20 ENCOUNTER — Ambulatory Visit: Admit: 2016-02-20 | Discharge: 2016-02-20 | Payer: MEDICARE | Attending: Family | Primary: Family Medicine

## 2016-02-20 DIAGNOSIS — N644 Mastodynia: Secondary | ICD-10-CM

## 2016-02-20 MED ORDER — BUPROPION HCL ER (XL) 150 MG PO TB24
150 MG | ORAL_TABLET | Freq: Every morning | ORAL | 3 refills | Status: DC
Start: 2016-02-20 — End: 2016-09-09

## 2016-02-20 NOTE — Patient Instructions (Signed)
Monitor your blood pressure, keep a log and call with a report in 2 weeks.    Breast Pain: Care Instructions  Your Care Instructions  Breast tenderness and pain may come and go with your monthly periods (cyclic), or it may not follow any pattern (noncyclic). Breast pain is rarely caused by a serious health problem. You may need tests to find the cause.  Follow-up care is a key part of your treatment and safety. Be sure to make and go to all appointments, and call your doctor if you are having problems. It???s also a good idea to know your test results and keep a list of the medicines you take.  How can you care for yourself at home?  ?? If your doctor gave you medicine, take it exactly as prescribed. Call your doctor if you think you are having a problem with your medicine.  ?? Take an over-the-counter pain medicine, such as acetaminophen (Tylenol), ibuprofen (Advil, Motrin), or naproxen (Aleve), to relieve pain and swelling. Read and follow all instructions on the label.  ?? Do not take two or more pain medicines at the same time unless the doctor told you to. Many pain medicines have acetaminophen, which is Tylenol. Too much acetaminophen (Tylenol) can be harmful.  ?? Wear a supportive bra, such as a sports bra or a jog bra.  ?? Cut down on the amount of fat in your diet. If you need help planning healthy meals, see a dietitian.  ?? Get at least 30 minutes of exercise on most days of the week. Walking is a good choice. You also may want to do other activities, such as running, swimming, cycling, or playing tennis or team sports.  ?? Keep a healthy sleep pattern. Go to bed at the same time every night, and get up at the same time every day.  When should you call for help?  Call your doctor now or seek immediate medical care if:  ?? You have new changes in a breast, such as:  ?? A lump or thickening in your breast or armpit.  ?? A change in the breast's size or shape.  ?? Skin changes, such as dimples or puckers.  ?? Nipple  discharge.  ?? A change in the color or feel of the skin of your breast or the darker area around the nipple (areola).  ?? A change in the shape of the nipple (it may look like it's being pulled into the breast).  ?? You have symptoms of a breast infection, such as:  ?? Increased pain, swelling, redness, or warmth around a breast.  ?? Red streaks extending from the breast.  ?? Pus draining from a breast.  ?? A fever.  Watch closely for changes in your health, and be sure to contact your doctor if:  ?? Your breast pain does not get better after 1 week.  ?? You have a lump or thickening in your breast or armpit.  Where can you learn more?  Go to [email or web address] and sign in to your MyChart account. Enter [phone] in the Search Health Information box to learn more about "Breast Pain: Care Instructions."     If you do not have an account, please click on the "Sign Up Now" link.  Current as of: May 24, 2015  Content Version: 11.1  ?? 2006-2016 [employer]. Care instructions adapted under license by [facility]. If you have questions about a medical condition or this instruction, always ask your healthcare professional. [employer]  disclaims any warranty or liability for your use of this information.       DASH Diet: Care Instructions  Your Care Instructions  The DASH diet is an eating plan that can help lower your blood pressure. DASH stands for Dietary Approaches to Stop Hypertension. Hypertension is high blood pressure.  The DASH diet focuses on eating foods that are high in calcium, potassium, and magnesium. These nutrients can lower blood pressure. The foods that are highest in these nutrients are fruits, vegetables, low-fat dairy products, nuts, seeds, and legumes. But taking calcium, potassium, and magnesium supplements instead of eating foods that are high in those nutrients does not have the same effect. The DASH diet also includes whole grains, fish, and poultry.  The DASH  diet is one of several lifestyle changes your doctor may recommend to lower your high blood pressure. Your doctor may also want you to decrease the amount of sodium in your diet. Lowering sodium while following the DASH diet can lower blood pressure even further than just the DASH diet alone.  Follow-up care is a key part of your treatment and safety. Be sure to make and go to all appointments, and call your doctor if you are having problems. It's also a good idea to know your test results and keep a list of the medicines you take.  How can you care for yourself at home?  Following the DASH diet  ?? Eat 4 to 5 servings of fruit each day. A serving is 1 medium-sized piece of fruit, ?? cup chopped or canned fruit, 1/4 cup dried fruit, or 4 ounces (?? cup) of fruit juice. Choose fruit more often than fruit juice.  ?? Eat 4 to 5 servings of vegetables each day. A serving is 1 cup of lettuce or raw leafy vegetables, ?? cup of chopped or cooked vegetables, or 4 ounces (?? cup) of vegetable juice. Choose vegetables more often than vegetable juice.  ?? Get 2 to 3 servings of low-fat and fat-free dairy each day. A serving is 8 ounces of milk, 1 cup of yogurt, or 1 ?? ounces of cheese.  ?? Eat 6 to 8 servings of grains each day. A serving is 1 slice of bread, 1 ounce of dry cereal, or ?? cup of cooked [place]ro[place], pasta, or cooked cereal. Try to choose whole-grain products as much as possible.  ?? Limit lean meat, poultry, and fish to 2 servings each day. A serving is 3 ounces, about the size of a deck of cards.  ?? Eat 4 to 5 servings of nuts, seeds, and legumes (cooked dried beans, lentils, and split peas) each week. A serving is 1/3 cup of nuts, 2 tablespoons of seeds, or ?? cup of cooked beans or peas.  ?? Limit fats and oils to 2 to 3 servings each day. A serving is 1 teaspoon of vegetable oil or 2 tablespoons of salad dressing.  ?? Limit sweets and added sugars to 5 servings or less a week. A serving is 1 tablespoon jelly or jam, ?? cup  sorbet, or 1 cup of lemonade.  ?? Eat less than 2,300 milligrams (mg) of sodium a day. If you limit your sodium to 1,500 mg a day, you can lower your blood pressure even more.  Tips for success  ?? Start small. Do not try to make dramatic changes to your diet all at once. You might feel that you are missing out on your favorite foods and then be more likely to not follow  the plan. Make small changes, and stick with them. Once those changes become habit, add a few more changes.  ?? Try some of the following:  ?? Make it a goal to eat a fruit or vegetable at every meal and at snacks. This will make it easy to get the recommended amount of fruits and vegetables each day.  ?? Try yogurt topped with fruit and nuts for a snack or healthy dessert.  ?? Add lettuce, tomato, cucumber, and onion to sandwiches.  ?? Combine a ready-made pizza crust with low-fat mozzarella cheese and lots of vegetable toppings. Try using tomatoes, squash, spinach, broccoli, carrots, cauliflower, and onions.  ?? Have a variety of cut-up vegetables with a low-fat dip as an appetizer instead of chips and dip.  ?? Sprinkle sunflower seeds or chopped almonds over salads. Or try adding chopped walnuts or almonds to cooked vegetables.  ?? Try some vegetarian meals using beans and peas. Add garbanzo or kidney beans to salads. Make burritos and tacos with mashed pinto beans or black beans.  Where can you learn more?  Go to [email or web address] and sign in to your MyChart account. Enter [phone] in the Search Health Information box to learn more about "DASH Diet: Care Instructions."     If you do not have an account, please click on the "Sign Up Now" link.  Current as of: March 20, 2015  Content Version: 11.1  ?? 2006-2016 [employer]. Care instructions adapted under license by [facility]. If you have questions about a medical condition or this instruction, always ask your healthcare professional. [employer], [employer] disclaims any  warranty or liability for your use of this information.

## 2016-02-20 NOTE — Progress Notes (Signed)
Assessment and plan  Lisa York was seen today for other.    Diagnoses and all orders for this visit:    Breast pain, left: Patient reports pain and tenderness in left breast.  On exam patient also reports tenderness in the left outer quadrants.  No erythema, warmth, edema or skin changes noted.  No mass palpated.  Patient does report prior lumpectomy around this area a few years back.  Patient is past due for mammogram.  Order for diagnostic mammogram as below and ultrasound if needed for further evaluation.  -     MAM Digital Diagnostic Bilateral; Future  -     US Breast Left; Future    Elevated blood pressure: Blood pressure is significantly elevated today.  Patient denies history of ever having an elevated blood pressure.  Patient reports she can monitor blood pressure at home.  Instructed patient to monitor blood pressure daily, keep a log and call with report in 2 weeks or sooner with problems or concerns. Advised to stop smoking.     Anxiety: Patient reports increased anxiety and states that Paxil does not seem to be working as well as it used to.  Discussed options.  Patient does not want to discontinue Paxil however would like to add medication if possible.  Will try adding Wellbutrin as below.  Reviewed possible side effects and instructed patient to follow-up in office in 4 weeks or sooner with problems or concerns.  -     buPROPion (WELLBUTRIN XL) 150 MG extended release tablet; Take 1 tablet by mouth every morning    Call or return to clinic prn if these symptoms worsen or fail to improve and resolve as discussed  _________________________________________________________________________________________________    HPI  Chief Complaint   Patient presents with   ??? Other     left breast pain      Patient reports pain in left lateral breast. Patient reports previous benign cysts removed from the left breast close to area of current tenderness/pain. Patient states she has not noted a nodule, redness, swelling or  changes in the skin.     Anxiety  Patient is here for evaluation of anxiety.  She has the following anxiety symptoms: difficulty concentrating, irritable, panic attacks. Onset of symptoms was approximately 2 months ago.  Symptoms have been gradually worsening since that time. She denies current suicidal and homicidal ideation. Family history significant for anxiety and depression.Possible organic causes contributing are: none. Risk factors: previous episode of depression/anxiety. Previous treatment includes medication Paxil.   She complains of the following medication side effects: none.    Blood pressure is also significantly elevated. Patient denies ever having elevated blood pressure in the past. Patient denies sob, chest pain, dizziness or peripheral edema.     Past Medical History   Diagnosis Date   ??? Anxiety    ??? PONV (postoperative nausea and vomiting)      Social History   Substance Use Topics   ??? Smoking status: Current Every Day Smoker   ??? Smokeless tobacco: Never Used   ??? Alcohol use Yes     Subjective  Review of Systems   Constitutional: Negative for chills, fever and malaise/fatigue.   HENT: Negative.    Respiratory: Negative.    Cardiovascular:        Left breast Tenderness.    Gastrointestinal: Negative.    Genitourinary: Negative.    Musculoskeletal: Negative.    Skin: Negative.    Neurological: Negative.    Psychiatric/Behavioral: Negative.  Objective  BP (!) 144/100  Pulse 94  Ht 5' 4.96" (1.65 m)  Wt 188 lb (85.3 kg)  SpO2 97%  BMI 31.32 kg/m2  Physical Exam   Constitutional: She is oriented to person, place, and time and well-developed, well-nourished, and in no distress.   HENT:   Head: Normocephalic and atraumatic.   Eyes: Conjunctivae and EOM are normal.   Neck: Normal range of motion. Neck supple. No thyromegaly present.   Cardiovascular: Normal rate, regular rhythm and normal heart sounds.    Pulmonary/Chest: Effort normal and breath sounds normal. No respiratory distress. She has no  wheezes. She has no rales. She exhibits no tenderness.       Abdominal: Soft. Bowel sounds are normal.   Musculoskeletal: Normal range of motion.   Neurological: She is alert and oriented to person, place, and time.   Skin: Skin is warm and dry.   Psychiatric: Affect normal.       Terrall Laity, CNP    The note was completed using Dragon voice recognition transcription. Every effort was made to ensure accuracy; however, inadvertent  transcription errors may be present despite my best efforts to edit errors.

## 2016-02-26 ENCOUNTER — Encounter

## 2016-02-26 ENCOUNTER — Inpatient Hospital Stay: Admit: 2016-02-26 | Attending: Family | Primary: Family Medicine

## 2016-02-26 ENCOUNTER — Ambulatory Visit: Admit: 2016-02-26 | Primary: Family Medicine

## 2016-02-26 DIAGNOSIS — N644 Mastodynia: Secondary | ICD-10-CM

## 2016-02-26 MED ORDER — HYDROCODONE-ACETAMINOPHEN 10-325 MG PO TABS
10-325 | ORAL_TABLET | Freq: Four times a day (QID) | ORAL | 0 refills | Status: DC | PRN
Start: 2016-02-26 — End: 2016-03-30

## 2016-03-30 ENCOUNTER — Encounter

## 2016-03-30 MED ORDER — HYDROCODONE-ACETAMINOPHEN 10-325 MG PO TABS
10-325 | ORAL_TABLET | Freq: Four times a day (QID) | ORAL | 0 refills | Status: DC | PRN
Start: 2016-03-30 — End: 2016-04-29

## 2016-04-01 ENCOUNTER — Ambulatory Visit: Admit: 2016-04-01 | Discharge: 2016-04-01 | Payer: MEDICARE | Attending: Family | Primary: Family Medicine

## 2016-04-01 DIAGNOSIS — L84 Corns and callosities: Secondary | ICD-10-CM

## 2016-04-01 MED ORDER — FLUTICASONE PROPIONATE 50 MCG/ACT NA SUSP
50 | Freq: Every day | NASAL | 3 refills | 60.00000 days | Status: DC
Start: 2016-04-01 — End: 2016-09-09

## 2016-04-01 MED ORDER — OMEPRAZOLE 40 MG PO CPDR
40 | ORAL_CAPSULE | Freq: Every day | ORAL | 3 refills | 90.00000 days | Status: DC
Start: 2016-04-01 — End: 2016-09-09

## 2016-04-01 NOTE — Progress Notes (Signed)
Subjective:      Patient ID: Lisa York is a 65 y.o. female.    HPI   Chief Complaint   Patient presents with   ??? Other     left foot pain      Subjective:      Lisa York is a 65 y.o. female who presents with left foot pain. Onset of the symptoms was about a month ago. Precipitating event: No known injury.  Patient has history of callous on 2nd digit left foot and dorsal aspect of left foot. Current symptoms include: worsening symptoms after a period of activity and with shoes that cause pressure on the callouses. . Aggravating factors: any weight bearing or any pressure. Symptoms have gradually worsened. Patient has had prior foot problems. Evaluation to date: previous x-rays, steroid injections and prior surgery. Treatment to date: avoiding offending activities, rest and sandles .      Review of Systems   Constitutional: Negative for appetite change, chills and fever.   HENT: Negative.    Eyes: Negative.    Respiratory: Negative.    Cardiovascular: Negative.    Gastrointestinal: Negative.    Genitourinary: Negative.    Skin:        Non-ulcerative calluses left foot and 2nd digit of left foot.    Allergic/Immunologic: Negative.    Neurological: Negative.    Psychiatric/Behavioral: Negative.        Patient Active Problem List   Diagnosis   ??? Anxiety   ??? Chronic cholecystitis without calculus   ??? Gastroesophageal reflux disease without esophagitis   ??? Hyperlipidemia   ??? Primary insomnia   ??? Elevated blood pressure       Outpatient Prescriptions Marked as Taking for the 04/01/16 encounter (Office Visit) with Louann Sjogren, CNP   Medication Sig Dispense Refill   ??? HYDROcodone-acetaminophen (NORCO) 10-325 MG per tablet Take 1 tablet by mouth every 6 hours as needed for Pain . Earliest Fill Date: 03/30/16 90 tablet 0   ??? buPROPion (WELLBUTRIN XL) 150 MG extended release tablet Take 1 tablet by mouth every morning 30 tablet 3   ??? PARoxetine (PAXIL) 40 MG tablet Take 1 tablet by mouth daily 30 tablet 5   ??? albuterol  sulfate HFA (PROAIR HFA) 108 (90 BASE) MCG/ACT inhaler Inhale 2 puffs into the lungs every 6 hours as needed for Wheezing 1 Inhaler 5   ??? zolpidem (AMBIEN) 5 MG tablet TAKE 1 TABLET BY MOUTH NIGHTLY AS NEEDED FOR SLEEP 30 tablet 2   ??? fluticasone (FLONASE) 50 MCG/ACT nasal spray 1 spray by Nasal route daily 1 Bottle 3   ??? ibuprofen (ADVIL;MOTRIN) 600 MG tablet TAKE 1 TABLET BY MOUTH EVERY 8 HOURS AS NEEDED FOR PAIN 90 tablet 3       No Known Allergies    Social History   Substance Use Topics   ??? Smoking status: Current Every Day Smoker   ??? Smokeless tobacco: Never Used   ??? Alcohol use Yes       Objective:     Visit Vitals   ??? BP 136/84   ??? Pulse 80   ??? Ht 5' 4.96" (1.65 m)   ??? Wt 191 lb (86.6 kg)   ??? SpO2 97%   ??? BMI 31.82 kg/m2       Physical Exam   Constitutional: She is oriented to person, place, and time. Vital signs are normal. She appears well-developed and well-nourished. She is cooperative. She does not have a sickly  appearance. No distress.   HENT:   Head: Normocephalic and atraumatic.   Eyes: Conjunctivae and EOM are normal. Pupils are equal, round, and reactive to light.   Neck: Normal range of motion. Neck supple.   Cardiovascular: Normal rate, regular rhythm, S1 normal, S2 normal, normal heart sounds, intact distal pulses and normal pulses.  Exam reveals no gallop.    No murmur heard.  Pulmonary/Chest: Effort normal and breath sounds normal. No accessory muscle usage. No respiratory distress.   Abdominal: Soft. Normal appearance and bowel sounds are normal.   Musculoskeletal: Normal range of motion.   Neurological: She is alert and oriented to person, place, and time.   Skin: Skin is warm and dry.   Non-ulcerative Callous 2nd digit PIP joint of left foot and dorsal aspect of left foot.    Psychiatric: She has a normal mood and affect. Her speech is normal and behavior is normal.   Vitals reviewed.      Assessment/Plan:   1. Pre-ulcerative corn or callous  Patient with left foot second digit hammertoe.   Patient has a non-ulcerative callus at the PIP joint of 2nd digit (left foot) and dorsal aspect of left foot.  Patient has been wearing sandals to reduce pressure on calluses.  Referral to  - External Referral To Podiatry    2. Gastroesophageal reflux disease without esophagitis  Stable.  Refill as below.  - omeprazole (PRILOSEC) 40 MG delayed release capsule; Take 1 capsule by mouth daily  Dispense: 30 capsule; Refill: 3    3. Other allergic rhinitis  Stable with use of Flonase.  - fluticasone (FLONASE) 50 MCG/ACT nasal spray; 1 spray by Nasal route daily  Dispense: 1 Bottle; Refill: 3

## 2016-04-01 NOTE — Patient Instructions (Signed)
Corns and Calluses: Care Instructions  Your Care Instructions  Corns and calluses are areas of thick, hard, dead skin. They form to protect your skin from injury. Corns usually form where toes rub together. Calluses often form on the hands or feet. They may form wherever the skin rubs against something, such as shoes.  In most cases, you can take steps at home to care for a corn or callus.  Follow-up care is a key part of your treatment and safety. Be sure to make and go to all appointments, and call your doctor if you are having problems. It's also a good idea to know your test results and keep a list of the medicines you take.  How can you care for yourself at home?  ?? Wear shoes and other footwear that fit correctly and are roomy. This will reduce rubbing and give corns or calluses time to heal.  ?? Use protective pads, such as moleskin, to cushion the callus or corn.  ?? Soften the corn or callus and remove the dead skin by using over-the-counter products such as salicylic acid. If you have a condition that causes problems with blood flow (such as coronary artery disease) or loss of feeling in your feet (such as diabetes), talk to your doctor before you try any home treatment.  ?? Soak your corn or callus in warm water, and then use a pumice stone to rub dead skin away.  ?? Wash your feet regularly, and rub lotion into your feet while they are still moist. Dry skin can cause a callus to crack and bleed.  ?? Never cut the corn or callus yourself, especially if you have problems with blood flow to your legs or feet.  When should you call for help?  Call your doctor now or seek immediate medical care if:  ?? You have signs of infection, such as:  ?? Increased pain, swelling, warmth, or redness around the corn or callus.  ?? Red streaks leading from the corn or callus.  ?? Pus draining from the corn or callus.  ?? A fever.  Watch closely for changes in your health, and be sure to contact your doctor if:  ?? You have a  blood flow problem, such as diabetes, and your corn or callus is bothering you.  ?? You do not get better as expected.  Where can you learn more?  Go to [email or web address] and sign in to your MyChart account. Enter R244 in the Search Health Information box to learn more about "Corns and Calluses: Care Instructions."     If you do not have an account, please click on the "Sign Up Now" link.  Current as of: October 10, 2015  Content Version: 11.2  ?? 2006-2017 [employer]. Care instructions adapted under license by [facility]. If you have questions about a medical condition or this instruction, always ask your healthcare professional. [employer] disclaims any warranty or liability for your use of this information.

## 2016-04-29 ENCOUNTER — Encounter

## 2016-04-29 NOTE — Telephone Encounter (Signed)
LAST SEEN 04/01/16   OARRS 03/30/16  NO FUTURE APPT AT THIS TIME

## 2016-05-01 MED ORDER — HYDROCODONE-ACETAMINOPHEN 10-325 MG PO TABS
10-325 | ORAL_TABLET | Freq: Four times a day (QID) | ORAL | 0 refills | Status: DC | PRN
Start: 2016-05-01 — End: 2016-06-01

## 2016-05-01 NOTE — Telephone Encounter (Signed)
OARRS ran on 03/30/16

## 2016-06-01 ENCOUNTER — Encounter

## 2016-06-02 MED ORDER — HYDROCODONE-ACETAMINOPHEN 10-325 MG PO TABS
10-325 | ORAL_TABLET | Freq: Four times a day (QID) | ORAL | 0 refills | Status: DC | PRN
Start: 2016-06-02 — End: 2016-06-26

## 2016-06-02 NOTE — Telephone Encounter (Signed)
Controlled Substances Monitoring: Attestation: The Prescription Monitoring Report for this patient was reviewed today. (Burk Hoctor D Hady Niemczyk, CNP)  Documentation: No signs of potential drug abuse or diversion identified. (Tomeika Weinmann D Annajulia Lewing, CNP)

## 2016-06-02 NOTE — Telephone Encounter (Signed)
oarrs scanned in

## 2016-06-03 ENCOUNTER — Encounter

## 2016-06-03 MED ORDER — ZOLPIDEM TARTRATE 5 MG PO TABS
5 MG | ORAL_TABLET | ORAL | 2 refills | Status: DC
Start: 2016-06-03 — End: 2018-09-07

## 2016-06-03 NOTE — Telephone Encounter (Signed)
Last seen 04/01/2016  oarrs ran 06/01/16

## 2016-06-26 ENCOUNTER — Encounter

## 2016-06-26 NOTE — Telephone Encounter (Signed)
Pilar JarvisLinda S Sardinha called requesting a refill on the following medications:  Requested Prescriptions     Pending Prescriptions Disp Refills   ??? HYDROcodone-acetaminophen (NORCO) 10-325 MG per tablet 90 tablet 0     Sig: Take 1 tablet by mouth every 6 hours as needed for Pain .     Pharmacy verified:  .pv  Patient called for refill, stating that she is leaving for vacation, please notify when RX is ready      Date of last visit:   Date of next visit (if applicable): Visit date not found    Status of current medication:     Patient low on medication

## 2016-06-29 MED ORDER — HYDROCODONE-ACETAMINOPHEN 10-325 MG PO TABS
10-325 | ORAL_TABLET | Freq: Four times a day (QID) | ORAL | 0 refills | Status: DC | PRN
Start: 2016-06-29 — End: 2016-09-09

## 2016-07-28 ENCOUNTER — Ambulatory Visit: Admit: 2016-07-28 | Discharge: 2016-07-28 | Payer: MEDICARE | Attending: Family | Primary: Family Medicine

## 2016-07-28 DIAGNOSIS — Z01818 Encounter for other preprocedural examination: Secondary | ICD-10-CM

## 2016-07-28 LAB — COMPREHENSIVE METABOLIC PANEL
ALT: 16 U/L (ref 10–40)
AST: 15 U/L (ref 15–37)
Albumin/Globulin Ratio: 1.7 (ref 1.1–2.2)
Albumin: 4.7 g/dL (ref 3.4–5.0)
Alkaline Phosphatase: 87 U/L (ref 40–129)
Anion Gap: 16 (ref 3–16)
BUN: 11 mg/dL (ref 7–20)
CO2: 27 mmol/L (ref 21–32)
Calcium: 10 mg/dL (ref 8.3–10.6)
Chloride: 98 mmol/L — ABNORMAL LOW (ref 99–110)
Creatinine: 0.6 mg/dL (ref 0.6–1.2)
GFR African American: >60 (ref >60)
GFR Non-African American: >60 (ref >60)
Globulin: 2.8 g/dL
Glucose: 110 mg/dL — ABNORMAL HIGH (ref 70–99)
Potassium: 5.2 mmol/L — ABNORMAL HIGH (ref 3.5–5.1)
Sodium: 141 mmol/L (ref 136–145)
Total Bilirubin: 0.4 mg/dL (ref 0.0–1.0)
Total Protein: 7.5 g/dL (ref 6.4–8.2)

## 2016-07-28 LAB — CBC WITH AUTO DIFFERENTIAL
Basophils %: 0.5 %
Basophils Absolute: 0 10*3/uL (ref 0.0–0.2)
Eosinophils %: 2.2 %
Eosinophils Absolute: 0.2 10*3/uL (ref 0.0–0.6)
Hematocrit: 43.2 % (ref 36.0–48.0)
Hemoglobin: 14.8 g/dL (ref 12.0–16.0)
Lymphocytes %: 42.2 %
Lymphocytes Absolute: 4.5 10*3/uL (ref 1.0–5.1)
MCH: 30.7 pg (ref 26.0–34.0)
MCHC: 34.2 g/dL (ref 31.0–36.0)
MCV: 89.6 fL (ref 80.0–100.0)
MPV: 8.3 fL (ref 5.0–10.5)
Monocytes %: 4.9 %
Monocytes Absolute: 0.5 10*3/uL (ref 0.0–1.3)
Neutrophils %: 50.2 %
Neutrophils Absolute: 5.4 10*3/uL (ref 1.7–7.7)
Platelets: 280 10*3/uL (ref 135–450)
RBC: 4.82 M/uL (ref 4.00–5.20)
RDW: 13.5 % (ref 12.4–15.4)
WBC: 10.8 10*3/uL (ref 4.0–11.0)

## 2016-07-28 NOTE — Progress Notes (Signed)
Subjective:    Patient:  Lisa York   Date of birth: 03/02/1951     Patient presents for preoperative history and physical.   She is scheduled for repair of left great toe bunion and left hammer toe repair with Dr. Cain Sieve at [facility].     There is no significant history of abnormal bleeding or anesthesia complications however patient does have post nausea and vomiting.     Past Medical History:   Diagnosis Date   ??? Anxiety    ??? PONV (postoperative nausea and vomiting)         Past Surgical History:   Procedure Laterality Date   ??? APPENDECTOMY     ??? CHOLECYSTECTOMY  06/18/2014    Laparoscopic   ??? FOOT SURGERY Left     x 2   ??? HERNIA REPAIR     ??? KNEE SURGERY Left    ??? SHOULDER SURGERY Right    ??? TUBAL LIGATION          Outpatient Prescriptions Marked as Taking for the 07/28/16 encounter (Office Visit) with Louann Sjogren, CNP   Medication Sig Dispense Refill   ??? HYDROcodone-acetaminophen (NORCO) 10-325 MG per tablet Take 1 tablet by mouth every 6 hours as needed for Pain . Earliest Fill Date: 06/29/16 90 tablet 0   ??? zolpidem (AMBIEN) 5 MG tablet TAKE 1 TABLET BY MOUTH NIGHTLY AS NEEDED FOR SLEEP 30 tablet 2   ??? omeprazole (PRILOSEC) 40 MG delayed release capsule Take 1 capsule by mouth daily 30 capsule 3   ??? fluticasone (FLONASE) 50 MCG/ACT nasal spray 1 spray by Nasal route daily 1 Bottle 3   ??? buPROPion (WELLBUTRIN XL) 150 MG extended release tablet Take 1 tablet by mouth every morning 30 tablet 3   ??? PARoxetine (PAXIL) 40 MG tablet Take 1 tablet by mouth daily 30 tablet 5   ??? albuterol sulfate HFA (PROAIR HFA) 108 (90 BASE) MCG/ACT inhaler Inhale 2 puffs into the lungs every 6 hours as needed for Wheezing 1 Inhaler 5   ??? ibuprofen (ADVIL;MOTRIN) 600 MG tablet TAKE 1 TABLET BY MOUTH EVERY 8 HOURS AS NEEDED FOR PAIN 90 tablet 3        No Known Allergies     Family History   Problem Relation Age of Onset   ??? Cancer Mother      colon   ??? Cancer Sister      colon   ??? Cancer Brother      colon   ??? Cancer Brother       bladder, lung, throat   ??? Cancer Brother      prostate   ??? Cancer Brother      skin        Social History   Substance Use Topics   ??? Smoking status: Current Every Day Smoker   ??? Smokeless tobacco: Never Used   ??? Alcohol use Yes         Immunization History   Administered Date(s) Administered   ??? Influenza Virus Vaccine 09/21/2013, 09/24/2014   ??? Influenza, High Dose 11/05/2015   ??? Pneumococcal 13-valent Conjugate (Prevnar13) 10/16/2014       Review of systems:  Constitutional:     Unexplained weight loss - no     Fever - no  Skin:     Rash - no     Itching - no  ENT:     Head congestion - no     Hearing loss - no  Eye:     Blurred vision - no     Eye pain - no  Cardiac:     Chest pain or discomfort - no     Orthopnea or PND - no  Respiratory     Cough - no     Wheeze - no     Dyspnea on exertion - no  Gastrointestinal     Frequent heartburn - no     Blood in stool - no  Urologic     Dysuria - no     Hematuria - no  Musculoskeletal     Chronic left foot pain related to bunion and hammer toe  Neurologic     Dizziness - no     Syncope - no  Psychiatric     Suicidal ideation - no     Anxiety - no    Objective:  BP 136/78   Pulse 93   Temp 98 ??F (36.7 ??C) (Oral)    Resp 18   Ht 5' 4.96" (1.65 m)   Wt 197 lb 3.2 oz (89.4 kg)   SpO2 97%   BMI 32.86 kg/m2  Patient is alert, oriented, and in no acute distress.  Eyes:  Conjunctivae and lids are clear             Pupils are equal, round, and reactive, irises nondeformed  Ears:  External ears and nose unremarkable, canals are clear, TMs clear   Mouth:  Lips, gums, tongue, oral mucosa unremarkable  Throat: Palates unremarkable               Pharynx clear  Nose: clear  Neck:  No masses, trachea midline, phonation normal, thyroid unremarkable              No significant cervical or supraclavicular adenopathy  Chest:  No deformity of thorax              Respirations easy and unlabored with equal breath sounds              Lungs clear  Heart:  Rhythm - regular               Murmurs  - none              Gallop - none               JVD - none              Pretibial edema - none  Abdomen:  Soft  with no masses noted                     Hernia - none                    Liver and spleen not palpably enlarged                    Bowel sounds are normally active                    Tenderness - none                    Rebound or rididity - none  Musculoskeletal                     Left foot great toe bunion and left foot 4th digit hammer toe  Neurologic:  Cranial nerves 2-12 are intact  No ataxia                     No focal motor deficits found                        Reflexes in upper extremities are intact and symmetrical                      Reflexes in lower extremities are intact and symmetrical  Skin: Warm and dry, turgor normal           No rash            No lesion  Psychiatric: mood and affect intact                     speech and thought processes seem appropriate     Assessment and Plan:  1. Preop examination  Patient reports she feels well overall. Patient denies any acute problems or concerns. Order for labs and EKG as below. Patient denies personal history of family history of significant complications with anesthesia or bleeding tendencies. Complete history and physical as noted above. EKG shows NSR with no ST abnormalities. Assuming labs are at baseline, Patient may proceed with anesthesia as planned. IF you have any questions or concerns, please call our office at [phone].       CBC Auto Differential      Comprehensive Metabolic Panel    EKG 12 Lead   2. Bunion of great toe of left foot  See note above.    3. Hammer toe of left foot  See note above.          Terrall Laity, CNP    The note was completed using Dragon voice recognition transcription. Every effort was made to ensure accuracy; however, inadvertent  transcription errors may be present despite my best efforts to edit errors.

## 2016-07-29 ENCOUNTER — Encounter

## 2016-07-30 LAB — POTASSIUM: Potassium: 4.2 mmol/L (ref 3.4–5.0)

## 2016-08-20 ENCOUNTER — Encounter

## 2016-08-20 MED ORDER — PROAIR HFA 108 (90 BASE) MCG/ACT IN AERS
108 (90 Base) MCG/ACT | RESPIRATORY_TRACT | 5 refills | Status: DC
Start: 2016-08-20 — End: 2017-05-11

## 2016-08-20 MED ORDER — PAROXETINE HCL 40 MG PO TABS
40 MG | ORAL_TABLET | ORAL | 5 refills | Status: DC
Start: 2016-08-20 — End: 2017-05-11

## 2016-08-20 NOTE — Telephone Encounter (Signed)
Last office visit 8/1, advised to follow-up none, next appointment none.

## 2016-09-09 ENCOUNTER — Ambulatory Visit: Admit: 2016-09-09 | Discharge: 2016-09-09 | Payer: MEDICARE | Attending: Family | Primary: Family Medicine

## 2016-09-09 DIAGNOSIS — Z0000 Encounter for general adult medical examination without abnormal findings: Secondary | ICD-10-CM

## 2016-09-09 MED ORDER — OMEPRAZOLE 40 MG PO CPDR
40 | ORAL_CAPSULE | Freq: Every day | ORAL | 3 refills | Status: DC
Start: 2016-09-09 — End: 2017-05-11

## 2016-09-09 MED ORDER — FLUTICASONE PROPIONATE 50 MCG/ACT NA SUSP
50 | Freq: Every day | NASAL | 3 refills | Status: DC
Start: 2016-09-09 — End: 2017-05-11

## 2016-09-09 MED ORDER — HYDROCODONE-ACETAMINOPHEN 10-325 MG PO TABS
10-325 | ORAL_TABLET | Freq: Four times a day (QID) | ORAL | 0 refills | Status: DC | PRN
Start: 2016-09-09 — End: 2017-05-11

## 2016-09-09 NOTE — Progress Notes (Signed)
Vaccine Information Sheet, "Influenza - Inactivated"  given to Lisa York, or parent/legal guardian of  Lisa York and verbalized understanding.    Patient responses:    Have you ever had a reaction to a flu vaccine? No  Are you able to eat eggs without adverse effects?  YesDo you have any current illness?  No  Have you ever had Guillian Barre Syndrome?  No    Flu vaccine given per order. Please see immunization tab.

## 2016-09-09 NOTE — Progress Notes (Signed)
Subjective:    Patient presents today for a well adult physical. She has no new complaints or concerns.    Due:  Health Maintenance   Topic Date Due   ??? Hepatitis C screen  05-03-1951   ??? DTaP/Tdap/Td vaccine (1 - Tdap) 09/08/1968   ??? Diabetes screen  09/08/1989   ??? Zostavax vaccine  09/08/2009   ??? DEXA (modify frequency per FRAX score)  09/08/2014   ??? Colon cancer screen colonoscopy  11/29/2014   ??? Pneumococcal low/med risk (2 of 2 - PPSV23) 10/17/2015   ??? Flu vaccine (1) 08/28/2016   ??? Breast cancer screen  02/25/2018   ??? Lipid screen  04/28/2020       Past Medical History:   Diagnosis Date   ??? Anxiety    ??? PONV (postoperative nausea and vomiting)         Past Surgical History:   Procedure Laterality Date   ??? APPENDECTOMY     ??? CHOLECYSTECTOMY  06/18/2014    Laparoscopic   ??? FOOT SURGERY Left     x 2   ??? HERNIA REPAIR     ??? KNEE SURGERY Left    ??? SHOULDER SURGERY Right    ??? TUBAL LIGATION          Outpatient Prescriptions Marked as Taking for the 09/09/16 encounter (Office Visit) with Louann Sjogren, CNP   Medication Sig Dispense Refill   ??? PROAIR HFA 108 (90 Base) MCG/ACT inhaler INHALE 2 PUFFS INTO THE LUNGS EVERY 6 HOURS AS NEEDED FOR WHEEZING 8.5 g 5   ??? PARoxetine (PAXIL) 40 MG tablet TAKE 1 TABLET BY MOUTH DAILY 30 tablet 5   ??? HYDROcodone-acetaminophen (NORCO) 10-325 MG per tablet Take 1 tablet by mouth every 6 hours as needed for Pain . Earliest Fill Date: 06/29/16 90 tablet 0   ??? zolpidem (AMBIEN) 5 MG tablet TAKE 1 TABLET BY MOUTH NIGHTLY AS NEEDED FOR SLEEP 30 tablet 2   ??? omeprazole (PRILOSEC) 40 MG delayed release capsule Take 1 capsule by mouth daily 30 capsule 3   ??? fluticasone (FLONASE) 50 MCG/ACT nasal spray 1 spray by Nasal route daily 1 Bottle 3   ??? ibuprofen (ADVIL;MOTRIN) 600 MG tablet TAKE 1 TABLET BY MOUTH EVERY 8 HOURS AS NEEDED FOR PAIN 90 tablet 3        No Known Allergies     Family History   Problem Relation Age of Onset   ??? Cancer Mother      colon   ??? Cancer Sister      colon   ???  Cancer Brother      colon   ??? Cancer Brother      bladder, lung, throat   ??? Cancer Brother      prostate   ??? Cancer Brother      skin        Social History   Substance Use Topics   ??? Smoking status: Current Every Day Smoker   ??? Smokeless tobacco: Never Used   ??? Alcohol use Yes         No flowsheet data found.      Immunization History   Administered Date(s) Administered   ??? Influenza Virus Vaccine 09/21/2013, 09/24/2014   ??? Influenza, High Dose 11/05/2015   ??? Pneumococcal 13-valent Conjugate (Prevnar13) 10/16/2014       Review of systems:  Constitutional:     Unexplained weight loss - no     Fever - no  Skin:  Rash - no     Itching - no  ENT:     Head congestion - no     Hearing loss - no  Eye:     Blurred vision - no     Eye pain - no  Cardiac:     Chest pain or discomfort - no     Orthopnea or PND - no  Respiratory     Cough - no     Wheeze - no     Dyspnea on exertion - no  Gastrointestinal     Frequent heartburn - no     Blood in stool - no  Urologic     Dysuria - no     Hematuria - no  Neurologic     Dizziness - no     Syncope - no  Psychiatric     Suicidal ideation - no     Anxiety - no  Musculoskeletal: recent bunionectomy and repair of hammer toe.       Right foot with dressing intact. Patients reports no s/s of infection and is following with pediatrist.     Objective:  BP 138/86   Pulse 103   Temp 98.3 ??F (36.8 ??C) (Oral)    Ht 5' 4.96" (1.65 m)   Wt 199 lb (90.3 kg)   SpO2 98%   BMI 33.16 kg/m2  Patient is alert, oriented, and in no acute distress.  Eyes:  Conjunctivae and lids are clear             Pupils are equal, round, and reactive, irises nondeformed  Ears:  External ears and nose unremarkable, canals are clear, TMs clear   Mouth:  Lips, gums, tongue, oral mucosa unremarkable  Throat: Palates unremarkable               Pharynx clear  Nose: clear  Neck:  No masses, trachea midline, phonation normal, thyroid unremarkable              No significant cervical or supraclavicular adenopathy  Chest:  No  deformity of thorax              Respirations easy and unlabored with equal breath sounds              Lungs clear however diminished throughout  Heart:  Rhythm - regular               Murmurs - none              Gallop - none               JVD - none              Pretibial edema - none  Abdomen:  Soft  with no masses noted                     Hernia - none                    Liver and spleen not palpably enlarged                    Bowel sounds are normally active                    Tenderness - none                    Rebound or rididity - none  Neurologic:  Cranial  nerves 2-12 are intact                      No ataxia                     No focal motor deficits found                        Reflexes in upper extremities are intact and symmetrical                      Reflexes in lower extremities are intact and symmetrical  Skin: Warm and dry, turgor normal           No rash            Surgical incision right foot with dressing intact.  Psychiatric: mood and affect intact                     speech and thought processes seem appropriate     Assessment and Plan:  1. Well adult exam  Patient presents today for well visit.  Patient denies any acute problems or concerns.  Patient is recuperating from surgery of right foot for repair of bunion and hammertoe.  Patient is doing well with no signs of symptoms of infection.  Patient continues to follow with podiatrist.  Discussed health maintenance and screening recommendations including hep c screen,  osteoporosis screening, colon cancer screening, Flu vaccine and pneumococcal vaccine.  Also see orders as below.   2. Chronic joint pain  Discussed with patient recent changes in laws regarding acute and chronic pain management.  Patient is interested in following with pain management.  Provided referral to pain management.     HYDROcodone-acetaminophen (NORCO) 10-325 MG per tablet    Comprehensive Metabolic Panel    Amb External Referral To Pain Clinic   3. Gastroesophageal  reflux disease without esophagitis  Symptoms are well controlled with current medication regimen.  Refill as below.  omeprazole (PRILOSEC) 40 MG delayed release capsule   4. Primary insomnia  Fairly well controlled with current treatment.    5. Osteoporosis screening  DEXA Bone Density Axial Skeleton   6. Mixed hyperlipidemia  Lipid Panel   7. Encounter for screening for diabetes mellitus  Hemoglobin A1C   8. Needs flu shot  INFLUENZA, HIGH DOSE, 65 YRS +, IM, PF, PREFILL SYR, 0.5ML (FLUZONE HD)   9. Need for pneumococcal vaccination  Pneumococcal polysaccharide vaccine 23-valent >= 2yo subcutaneous/IM (PNEUMOVAX 23)   10. Need for hepatitis C screening test  HEPATITIS C ANTIBODY   11. Elevated glucose  Hemoglobin A1C     Also discussed recommendation for colon screening. Patient reports she had colonoscopy 2015. Will request op report and update our medical records.     Terrall Laity, CNP    The note was completed using Dragon voice recognition transcription. Every effort was made to ensure accuracy; however, inadvertent  transcription errors may be present despite my best efforts to edit errors.

## 2017-05-11 ENCOUNTER — Ambulatory Visit: Admit: 2017-05-11 | Discharge: 2017-05-11 | Payer: MEDICARE | Attending: Family | Primary: Family Medicine

## 2017-05-11 DIAGNOSIS — J40 Bronchitis, not specified as acute or chronic: Secondary | ICD-10-CM

## 2017-05-11 MED ORDER — PAROXETINE HCL 40 MG PO TABS
40 | ORAL_TABLET | ORAL | 5 refills | Status: DC
Start: 2017-05-11 — End: 2018-01-31

## 2017-05-11 MED ORDER — ALBUTEROL SULFATE HFA 108 (90 BASE) MCG/ACT IN AERS
108 | RESPIRATORY_TRACT | 5 refills | Status: DC
Start: 2017-05-11 — End: 2018-02-14

## 2017-05-11 MED ORDER — PREDNISONE 20 MG PO TABS
20 MG | ORAL_TABLET | Freq: Every day | ORAL | 0 refills | Status: AC
Start: 2017-05-11 — End: 2017-05-16

## 2017-05-11 MED ORDER — AZITHROMYCIN 250 MG PO TABS
250 MG | ORAL_TABLET | ORAL | 0 refills | Status: DC
Start: 2017-05-11 — End: 2018-02-07

## 2017-05-11 MED ORDER — FLUTICASONE PROPIONATE 50 MCG/ACT NA SUSP
50 | Freq: Every day | NASAL | 5 refills | Status: DC
Start: 2017-05-11 — End: 2018-02-14

## 2017-05-11 MED ORDER — METHYLPREDNISOLONE SODIUM SUCC 40 MG IJ SOLR
40 MG | Freq: Once | INTRAMUSCULAR | Status: AC
Start: 2017-05-11 — End: 2017-05-11
  Administered 2017-05-11: 16:00:00 40 mg via INTRAMUSCULAR

## 2017-05-11 MED ORDER — OMEPRAZOLE 40 MG PO CPDR
40 | ORAL_CAPSULE | Freq: Every day | ORAL | 5 refills | Status: DC
Start: 2017-05-11 — End: 2018-01-31

## 2017-05-11 NOTE — Progress Notes (Signed)
Subjective:      Patient ID: Lisa York is a 66 y.o. female.    HPI     Chief Complaint   Patient presents with   . Other     sinus congestion, chest congestion, cough for 4 days      Acute Bronchitis  Patient presents for evaluation of nasal congestion, productive cough with sputum described as white, sweats, wheezing and headache. Symptoms began 5 days ago and are gradually worsening since that time. Past history is significant for seasonal allergies.    Review of Systems   Constitutional: Negative for appetite change, chills and fever.   HENT: Positive for congestion. Negative for ear pain and sore throat.    Eyes: Negative.    Respiratory: Positive for chest tightness, shortness of breath and wheezing.    Cardiovascular: Negative.    Gastrointestinal: Negative.    Endocrine: Negative.    Genitourinary: Negative.    Musculoskeletal: Negative.    Skin: Negative.    Neurological: Positive for headaches.   Psychiatric/Behavioral: Negative.        Patient Active Problem List   Diagnosis   . Anxiety   . Chronic cholecystitis without calculus   . Gastroesophageal reflux disease without esophagitis   . Hyperlipidemia   . Primary insomnia   . Elevated blood pressure reading       No outpatient prescriptions have been marked as taking for the 05/11/17 encounter (Office Visit) with Louann Sjogren, APRN - CNP.       No Known Allergies    Social History   Substance Use Topics   . Smoking status: Current Every Day Smoker   . Smokeless tobacco: Never Used   . Alcohol use Yes       Objective:   BP 136/80   Pulse 96   Temp 98.6 F (37 C)   Ht 5' 4.96" (1.65 m)   Wt 196 lb (88.9 kg)   SpO2 97%   BMI 32.66 kg/m     Physical Exam   Constitutional: She is oriented to person, place, and time. She appears well-developed and well-nourished.   HENT:   Head: Normocephalic and atraumatic.   Right Ear: Tympanic membrane, external ear and ear canal normal.   Left Ear: Tympanic membrane, external ear and ear canal normal.    Nose: Nose normal. Right sinus exhibits no maxillary sinus tenderness and no frontal sinus tenderness. Left sinus exhibits no maxillary sinus tenderness and no frontal sinus tenderness.   Mouth/Throat: Uvula is midline and mucous membranes are normal. Posterior oropharyngeal erythema (cobble stone appearance) present.   Eyes: Conjunctivae and EOM are normal. Pupils are equal, round, and reactive to light.   Neck: Normal range of motion. Neck supple.   Cardiovascular: Normal rate, regular rhythm, S1 normal, S2 normal, normal heart sounds, intact distal pulses and normal pulses.  Exam reveals no gallop.    No murmur heard.  Pulmonary/Chest: Effort normal. She has wheezes (expiratory wheezes scattered throughout).   Abdominal: Soft. Bowel sounds are normal.   Musculoskeletal: Normal range of motion.   Lymphadenopathy:     She has no cervical adenopathy.   Neurological: She is alert and oriented to person, place, and time.   Skin: Skin is warm and dry.   Psychiatric: She has a normal mood and affect. Her speech is normal and behavior is normal.   Vitals reviewed.      Assessment/Plan:   1. Bronchitis  Patient presents today with complaints of nasal  congestion, productive cough of white phlegm, sweats, wheezing, chest tightness and headache 5 days with worsening of symptoms over the past 24-48 hours.  On exam noted expiratory wheezes scattered throughout.  Pulse oximeter is 97% on room air.  Patient with known history of bronchitis.  I suspect symptoms are likely due to bronchitis.  Recommend treatment as below.  Advised patient to drink plenty of fluids, take medications as instructed and patient to follow-up if no better or worsening of symptoms.  Also recommended smoking cessation.  Patient verbalized understanding and agreeable to treatment plan.  - albuterol sulfate HFA (PROAIR HFA) 108 (90 Base) MCG/ACT inhaler; INHALE 2 PUFFS INTO THE LUNGS EVERY 6 HOURS AS NEEDED FOR WHEEZING  Dispense: 8.5 g; Refill: 5  -  methylPREDNISolone sodium (SOLU-MEDROL) injection 40 mg; Inject 1 mL into the muscle once  - predniSONE (DELTASONE) 20 MG tablet; Take 2 tablets by mouth daily for 5 days Start 05/12/17  Dispense: 10 tablet; Refill: 0  - azithromycin (ZITHROMAX Z-PAK) 250 MG tablet; 2 tablets or 500 mg by mouth on day one and 1 tablets or 250 mg by mouth on days 2-5  Dispense: 10 tablet; Refill: 0    2. Chronic seasonal allergic rhinitis, unspecified trigger  Patient with chronic seasonal allergic rhinitis.  Recommend patient continue with Flonase and oral antihistamine.  Refills as below.  - fluticasone (FLONASE) 50 MCG/ACT nasal spray; 1 spray by Nasal route daily  Dispense: 1 Bottle; Refill: 5    3. Gastroesophageal reflux disease without esophagitis  Symptoms are well controlled with daily use of omeprazole 40 mg by mouth.   - omeprazole (PRILOSEC) 40 MG delayed release capsule; Take 1 capsule by mouth daily  Dispense: 30 capsule; Refill: 5    4. Anxiety  Stable.   - PARoxetine (PAXIL) 40 MG tablet; TAKE 1 TABLET BY MOUTH DAILY  Dispense: 30 tablet; Refill: 5

## 2017-05-11 NOTE — Patient Instructions (Addendum)
Patient Education     I will be full-time at [employer] beginning June 28, 2017. [id number]         Drink plenty of fluids, take all doses of antibiotics and f/u if no better or worsening of symptoms.      Bronchitis: Care Instructions  Your Care Instructions    Bronchitis is inflammation of the bronchial tubes, which carry air to the lungs. The tubes swell and produce mucus, or phlegm. The mucus and inflamed bronchial tubes make you cough. You may have trouble breathing.  Most cases of bronchitis are caused by viruses like those that cause colds. Antibiotics usually do not help and they may be harmful.  Bronchitis usually develops rapidly and lasts about 2 to 3 weeks in otherwise healthy people.  Follow-up care is a key part of your treatment and safety. Be sure to make and go to all appointments, and call your doctor if you are having problems. It's also a good idea to know your test results and keep a list of the medicines you take.  How can you care for yourself at home?   Take all medicines exactly as prescribed. Call your doctor if you think you are having a problem with your medicine.   Get some extra rest.   Take an over-the-counter pain medicine, such as acetaminophen (Tylenol), ibuprofen (Advil, Motrin), or naproxen (Aleve) to reduce fever and relieve body aches. Read and follow all instructions on the label.   Do not take two or more pain medicines at the same time unless the doctor told you to. Many pain medicines have acetaminophen, which is Tylenol. Too much acetaminophen (Tylenol) can be harmful.   Take an over-the-counter cough medicine that contains dextromethorphan to help quiet a dry, hacking cough so that you can sleep. Avoid cough medicines that have more than one active ingredient. Read and follow all instructions on the label.   Breathe moist air from a humidifier, hot shower, or sink filled with hot water. The heat and moisture will thin mucus so you can cough it  out.   Do not smoke. Smoking can make bronchitis worse. If you need help quitting, talk to your doctor about stop-smoking programs and medicines. These can increase your chances of quitting for good.  When should you call for help?  Call 911 anytime you think you may need emergency care. For example, call if:    You have severe trouble breathing.   Call your doctor now or seek immediate medical care if:    You have new or worse trouble breathing.     You cough up dark brown or bloody mucus (sputum).     You have a new or higher fever.     You have a new rash.   Watch closely for changes in your health, and be sure to contact your doctor if:    You cough more deeply or more often, especially if you notice more mucus or a change in the color of your mucus.     You are not getting better as expected.   Where can you learn more?  Go to [email or web address] and sign in to your MyChart account. Enter H333 in the Bessie box to learn more about "Bronchitis: Care Instructions."     If you do not have an account, please click on the "Sign Up Now" link.  Current as of: December 02, 2016  Content Version: 11.6   2006-2018 [employer]. Care instructions adapted  under license by [facility]. If you have questions about a medical condition or this instruction, always ask your healthcare professional. [place] any warranty or liability for your use of this information.       Patient Education          prednisone  Pronunciation:  PRED ni sone  Brand:  Rayos, Sterapred, Sterapred 12 DAY, Sterapred DS, Sterapred DS 12 DAY  What is the most important information I should know about prednisone?  Prednisone treats many different conditions such as allergic disorders, skin conditions, ulcerative colitis, arthritis, lupus, psoriasis, or breathing disorders.  You should not take prednisone if you have a fungal infection anywhere in your  body.  Steroid medication can weaken your immune system, making it easier for you to get an infection. Avoid being near people who are sick or have infections. Do not receive a "live" vaccine while using prednisone.  Call your doctor at once if you have shortness of breath, severe pain in your upper stomach, bloody or tarry stools, severe depression, changes in personality or behavior, vision problems, or eye pain.  You should not stop using prednisone suddenly. Follow your doctor's instructions about tapering your dose.  What is prednisone?  Prednisone is a steroid. Prednisone prevents the release of substances in the body that cause inflammation. Prednisone also suppresses the immune system.  Prednisone is used as an anti-inflammatory or an immunosuppressant medication. Prednisone treats many different conditions such as allergic disorders, skin conditions, ulcerative colitis, arthritis, lupus, psoriasis, or breathing disorders.  Prednisone may also be used for purposes not listed in this medication guide.  What should I discuss with my healthcare provider before taking prednisone?  You should not use this medication if you are allergic to prednisone, or if you have a fungal infection anywhere in your body.  Steroid medication can weaken your immune system, making it easier for you to get an infection or worsening an infection you already have or have recently had. Tell your doctor about any illness or infection you have had within the past several weeks.  To make sure prednisone is safe for you, tell your doctor if you have:   any illness that causes diarrhea;   liver disease (such as cirrhosis);   kidney disease;   heart disease, high blood pressure, low levels of potassium in your blood;   a thyroid disorder;   diabetes;   a history of malaria;   tuberculosis;   osteoporosis;   glaucoma, cataracts, or herpes infection of the eyes;   stomach ulcers, ulcerative colitis, or a history of stomach  bleeding;   a muscle disorder such as myasthenia gravis; or   depression or mental illness.  Long-term use of steroids may lead to bone loss (osteoporosis), especially if you smoke, if you do not exercise, if you do not get enough vitamin D or calcium in your diet, or if you have a family history of osteoporosis. Talk with your doctor about your risk of osteoporosis.  Prednisone can cause low birth weight or birth defects if you take the medicine during your first trimester. Tell your doctor if you are pregnant or plan to become pregnant while using this medication. Use effective birth control.  Prednisone can pass into breast milk and may harm a nursing baby. Tell your doctor if you are breast-feeding a baby.  Steroids can affect growth in children. Talk with your doctor if you think your child is not growing at a normal rate while  using this medication.  How should I take prednisone?  Follow all directions on your prescription label. Your doctor may occasionally change your dose to make sure you get the best results. Do not take this medicine in larger or smaller amounts or for longer than recommended.  Take with food.  Your dosage needs may change if you have any unusual stress such as a serious illness, fever or infection, or if you have surgery or a medical emergency. Do not change your medication dose or schedule without your doctor's advice.  Measure liquid medicine with a special dose-measuring spoon or medicine cup. If you do not have a dose-measuring device, ask your pharmacist for one.  Do not crush, chew, or break a delayed-release tablet. Swallow it whole.  While using prednisone, you may need frequent blood tests at your doctor's office. Your blood pressure may also need to be checked.  This medication can cause unusual results with certain medical tests. Tell any doctor who treats you that you are using prednisone.  You should not stop using prednisone suddenly. Follow your doctor's instructions  about tapering your dose.  Wear a medical alert tag or carry an ID card stating that you take prednisone. Any medical care provider who treats you should know that you are using a steroid.  Store at room temperature away from moisture and heat.  What happens if I miss a dose?  Take the missed dose as soon as you remember. Skip the missed dose if it is almost time for your next scheduled dose. Do not take extra medicine to make up the missed dose.  What happens if I overdose?  Seek emergency medical attention or call the Poison Help line at 1-513-501-1038.  An overdose of prednisone is not expected to produce life threatening symptoms. However, long term use of high steroid doses can lead to symptoms such as thinning skin, easy bruising, changes in the shape or location of body fat (especially in your face, neck, back, and waist), increased acne or facial hair, menstrual problems, impotence, or loss of interest in sex.  What should I avoid while taking prednisone?  Avoid being near people who are sick or have infections. Call your doctor for preventive treatment if you are exposed to chicken pox or measles. These conditions can be serious or even fatal in people who are using a steroid.  Do not receive a "live" vaccine while using prednisone. Prednisone may increase your risk of harmful effects from a live vaccine. Live vaccines include measles, mumps, rubella (MMR), rotavirus, typhoid, yellow fever, varicella (chickenpox), zoster (shingles), and nasal flu (influenza) vaccine.  Avoid drinking alcohol while you are taking prednisone.  What are the possible side effects of prednisone?  Get emergency medical help if you have any of these signs of an allergic reaction: hives; difficult breathing; swelling of your face, lips, tongue, or throat.  Call your doctor at once if you have:   blurred vision, eye pain, or seeing halos around lights;   swelling, rapid weight gain, feeling short of breath;   severe depression,  feelings of extreme happiness or sadness, changes in personality or behavior, seizure (convulsions);   bloody or tarry stools, coughing up blood;   pancreatitis (severe pain in your upper stomach spreading to your back, nausea and vomiting, fast heart rate);   low potassium (confusion, uneven heart rate, extreme thirst, increased urination, leg discomfort, muscle weakness or limp feeling); or   dangerously high blood pressure (severe headache, blurred vision, buzzing  in your ears, anxiety, confusion, chest pain, shortness of breath, uneven heartbeats, seizure).  Other common side effects may include:   sleep problems (insomnia), mood changes;   increased appetite, gradual weight gain;   acne, increased sweating, dry skin, thinning skin, bruising or discoloration;   slow wound healing;   headache, dizziness, spinning sensation;   nausea, stomach pain, bloating; or   changes in the shape or location of body fat (especially in your arms, legs, face, neck, breasts, and waist).  This is not a complete list of side effects and others may occur. Call your doctor for medical advice about side effects. You may report side effects to FDA at 1-800-FDA-1088.  What other drugs will affect prednisone?  Many drugs can interact with prednisone. Not all possible interactions are listed here. Tell your doctor about all your medications and any you start or stop using during treatment with prednisone, especially:   amphotericin B;   cyclosporine;   digoxin, digitalis;   St. John's wort;   an antibiotic such as clarithromycin or telithromycin;   antifungal medication such as itraconazole, ketoconazole, posaconazole, voriconazole;   birth control pills and other hormones;   a blood thinner such as warfarin, Coumadin;   a diuretic or "water pill";   the hepatitis C medications boceprevir or telaprevir;   HIV or AIDS medicine such as atazanavir, delavirdine, efavirenz, fosamprenavir, indinavir, nelfinavir,  nevirapine, ritonavir, saquinavir;   insulin or diabetes medications you take by mouth;   a non-steroidal anti-inflammatory drug (NSAID) such as aspirin, ibuprofen (Advil, Motrin), naproxen (Aleve), celecoxib, diclofenac, indomethacin, meloxicam, and others;   seizure medications such as carbamazepine, fosphenytoin, oxcarbazepine, phenobarbital, phenytoin, primidone; or   the tuberculosis medications isoniazid, rifabutin, rifapentine, or rifampin.  This list is not complete and many other drugs can interact with prednisone. This includes prescription and over-the-counter medicines, vitamins, and herbal products. Give a list of all your medicines to any healthcare provider who treats you.  Where can I get more information?  Your pharmacist can provide more information about prednisone.    Remember, keep this and all other medicines out of the reach of children, never share your medicines with others, and use this medication only for the indication prescribed.  Every effort has been made to ensure that the information provided by [place] is accurate, up-to-date, and complete, but no guarantee is made to that effect. Drug information contained herein may be time sensitive. [employer] information has been compiled for use by healthcare practitioners and consumers in the [place] and therefore [employer] does not warrant that uses outside of the [place] are appropriate, unless specifically indicated otherwise. [employer]'s drug information does not endorse drugs, diagnose patients or recommend therapy. [employer]'s drug information is an Scientist, research (physical sciences) to assist licensed healthcare practitioners in caring for their patients and/or to serve consumers viewing this service as a supplement to, and not a substitute for, the expertise, skill, knowledge and judgment of healthcare practitioners. The absence of a warning for a given drug or drug combination in no way should be construed to  indicate that the drug or drug combination is safe, effective or appropriate for any given patient. [employer] does not assume any responsibility for any aspect of healthcare administered with the aid of information [employer] provides. The information contained herein is not intended to cover all possible uses, directions, precautions, warnings, drug interactions, allergic reactions, or adverse effects. If you have questions about the drugs you are taking, check with your  doctor, nurse or pharmacist.  Copyright [phone] [employer] Version: 9.01. Revision date: 02/10/2012.  Care instructions adapted under license by [facility]. If you have questions about a medical condition or this instruction, always ask your healthcare professional. [place] any warranty or liability for your use of this information.       Patient Education          azithromycin  Pronunciation:  a ZITH roe MYE sin  Brand:  Azithromycin 3 Day Dose Pack, Azithromycin 5 Day Dose Pack, Zithromax, Zithromax TRI-PAK, Zithromax Z-Pak, Zmax  What is the most important information I should know about azithromycin?  You should not use this medication if you have ever had jaundice or liver problems caused by taking azithromycin.  What is azithromycin?  Azithromycin is an antibiotic that fights bacteria.  Azithromycin is used to treat many different types of infections caused by bacteria, such as respiratory infections, skin infections, ear infections, and sexually transmitted diseases.  Azithromycin may also be used for purposes not listed in this medication guide.  What should I discuss with my healthcare provider before taking azithromycin?  You should not use azithromycin if you are allergic to it, or if:   you have ever had jaundice or liver problems caused by taking azithromycin; or   you are allergic to similar drugs such as clarithromycin, erythromycin, or telithromycin.  To make sure azithromycin is safe for you, tell  your doctor if you have ever had:   liver disease;   kidney disease;   myasthenia gravis;   a heart rhythm disorder;   low levels of potassium in your blood; or   long QT syndrome (in you or a family member).  This medicine is not expected to harm an unborn baby. Tell your doctor if you are pregnant or plan to become pregnant.  It is not known whether azithromycin passes into breast milk or if it could harm a nursing baby. Tell your doctor if you are breast-feeding a baby.  Do not give this medicine to a child younger than 29 months old.  How should I take azithromycin?  Follow all directions on your prescription label. Do not take this medicine in larger or smaller amounts or for longer than recommended. The dose and length of treatment with azithromycin may not be the same for every type of infection.  You may take most forms of azithromycin with or without food.  Take Zmax extended release liquid (oral suspension) on an empty stomach, at least 1 hour before or 2 hours after a meal.  To use the oral suspension single dose packet: Open the packet and pour the medicine into 2 ounces of water. Stir this mixture and drink all of it right away. Do not save for later use. To make sure you get the entire dose, add a little more water to the same glass, swirl gently and drink right away.  Throw away any mixed Zmax oral suspension that has not been used within 12 hours.  Shake the oral suspension (liquid) well just before you measure a dose. Measure liquid medicine with the dosing syringe provided, or with a special dose-measuring spoon or medicine cup. If you do not have a dose-measuring device, ask your pharmacist for one.  Use this medicine for the full prescribed length of time. Your symptoms may improve before the infection is completely cleared. Skipping doses may also increase your risk of further infection that is resistant to antibiotics. Azithromycin will not treat  a viral infection such as the flu or a  common cold.  Store at room temperature away from moisture and heat. Throw away any unused liquid medicine after 10 days.  What happens if I miss a dose?  Take the missed dose as soon as you remember. Skip the missed dose if it is almost time for your next scheduled dose. Do not  take extra medicine to make up the missed dose.  What happens if I overdose?  Seek emergency medical attention or call the Poison Help line at 1-3373252037.  What should I avoid while taking azithromycin?  Do not take antacids that contain aluminum or magnesium within 2 hours before or after you take azithromycin. This includes Acid Gone, Aldroxicon, Alternagel, Di-Gel, Gaviscon, Gelusil, Genaton, Maalox, Maldroxal, Milk of Magnesia, Mintox, Mylagen, Mylanta, Pepcid Complete, Rolaids, Rulox, and others. These antacids can make azithromycin less effective when taken at the same time.  Antibiotic medicines can cause diarrhea, which may be a sign of a new infection. If you have diarrhea that is watery or bloody, call your doctor. Do not use anti-diarrhea medicine unless your doctor tells you to.  Avoid exposure to sunlight or tanning beds. Azithromycin can make you sunburn more easily. Wear protective clothing and use sunscreen (SPF 30 or higher) when you are outdoors.  What are the possible side effects of azithromycin?  Get emergency medical help if you have signs of an allergic reaction (hives, difficult breathing, swelling in your face or throat) or a severe skin reaction (fever, sore throat, burning in your eyes, skin pain, red or purple skin rash that spreads and causes blistering and peeling).  Seek medical treatment if you have a serious drug reaction that can affect many parts of your body. Symptoms may include: skin rash, fever, swollen glands, flu-like symptoms, muscle aches, severe weakness, unusual bruising, or yellowing of your skin or eyes. This reaction may occur several weeks after you began using azithromycin.  Call your  doctor at once if you have:   severe stomach pain, diarrhea that is watery or bloody;   fast or pounding heartbeats, fluttering in your chest, shortness of breath, and sudden dizziness (like you might pass out); or   liver problems --nausea, upper stomach pain, itching, tired feeling, loss of appetite, dark urine, clay-colored stools, jaundice (yellowing of the skin or eyes).  Call your doctor right away if a baby taking azithromycin becomes irritable or vomits while eating or nursing.  Older adults may be more likely to have side effects on heart rhythm, including a life-threatening fast heart rate.  Common side effects may include:   diarrhea;   nausea, vomiting, stomach pain; or   headache.  This is not a complete list of side effects and others may occur. Call your doctor for medical advice about side effects. You may report side effects to FDA at 1-800-FDA-1088.  What other drugs will affect azithromycin?  Tell your doctor about all your current medicines and any you start or stop using, especially:   nelfinavir; or   a blood thinner --warfarin, Coumadin, Jantoven.  This list is not complete. Other drugs may interact with azithromycin, including prescription and over-the-counter medicines, vitamins, and herbal products. Not all possible interactions are listed in this medication guide.  Where can I get more information?  Your pharmacist can provide more information about azithromycin.    Remember, keep this and all other medicines out of the reach of children, never share your medicines with others, and use  this medication only for the indication prescribed.  Every effort has been made to ensure that the information provided by [place] is accurate, up-to-date, and complete, but no guarantee is made to that effect. Drug information contained herein may be time sensitive. [employer] information has been compiled for use by healthcare practitioners and consumers in the [place] and  therefore [employer] does not warrant that uses outside of the [place] are appropriate, unless specifically indicated otherwise. [employer]'s drug information does not endorse drugs, diagnose patients or recommend therapy. [employer]'s drug information is an Scientist, research (physical sciences) to assist licensed healthcare practitioners in caring for their patients and/or to serve consumers viewing this service as a supplement to, and not a substitute for, the expertise, skill, knowledge and judgment of healthcare practitioners. The absence of a warning for a given drug or drug combination in no way should be construed to indicate that the drug or drug combination is safe, effective or appropriate for any given patient. [employer] does not assume any responsibility for any aspect of healthcare administered with the aid of information [employer] provides. The information contained herein is not intended to cover all possible uses, directions, precautions, warnings, drug interactions, allergic reactions, or adverse effects. If you have questions about the drugs you are taking, check with your doctor, nurse or pharmacist.  Copyright [phone] [employer]: 17.07. Revision date: 09/25/2016.  Care instructions adapted under license by [facility]. If you have questions about a medical condition or this instruction, always ask your healthcare professional. [place] any warranty or liability for your use of this information.

## 2017-09-07 DIAGNOSIS — G40909 Epilepsy, unspecified, not intractable, without status epilepticus: Secondary | ICD-10-CM | POA: Insufficient documentation

## 2017-10-26 ENCOUNTER — Encounter: Admit: 2017-10-26 | Discharge: 2017-10-26 | Payer: MEDICARE | Primary: Family Medicine

## 2017-10-26 DIAGNOSIS — Z23 Encounter for immunization: Secondary | ICD-10-CM

## 2017-10-26 NOTE — Progress Notes (Signed)
Vaccine Information Sheet, "Influenza - Inactivated"  given to Lisa York, or parent/legal guardian of  Lisa York and verbalized understanding.    Patient responses:    Have you ever had a reaction to a flu vaccine? No  Are you able to eat eggs without adverse effects?  Yes  Do you have any current illness?  No  Have you ever had Guillian Barre Syndrome?  No    Flu vaccine given per order. Please see immunization tab.

## 2018-01-31 ENCOUNTER — Encounter: Primary: Family Medicine

## 2018-01-31 ENCOUNTER — Encounter

## 2018-01-31 MED ORDER — OMEPRAZOLE 40 MG PO CPDR
40 MG | ORAL_CAPSULE | Freq: Every day | ORAL | 5 refills | Status: DC
Start: 2018-01-31 — End: 2019-02-03

## 2018-01-31 MED ORDER — PAROXETINE HCL 40 MG PO TABS
40 MG | ORAL_TABLET | ORAL | 5 refills | Status: DC
Start: 2018-01-31 — End: 2018-11-14

## 2018-01-31 NOTE — Telephone Encounter (Signed)
LOV 05/11/2017, Not advised to follow up, FOV 02/07/2018

## 2018-02-07 ENCOUNTER — Ambulatory Visit: Admit: 2018-02-07 | Discharge: 2018-02-07 | Payer: MEDICARE | Attending: Family Medicine | Primary: Family Medicine

## 2018-02-07 DIAGNOSIS — Z1231 Encounter for screening mammogram for malignant neoplasm of breast: Secondary | ICD-10-CM

## 2018-02-07 NOTE — Patient Instructions (Signed)
Personalized Preventive Plan for ADYN HOES - 02/07/2018  Medicare offers a range of preventive health benefits. Some of the tests and screenings are paid in full while other may be subject to a deductible, co-insurance, and/or copay.    Some of these benefits include a comprehensive review of your medical history including lifestyle, illnesses that may run in your family, and various assessments and screenings as appropriate.    After reviewing your medical record and screening and assessments performed today your provider may have ordered immunizations, labs, imaging, and/or referrals for you.  A list of these orders (if applicable) as well as your Preventive Care list are included within your After Visit Summary for your review.    Other Preventive Recommendations:     A preventive eye exam performed by an eye specialist is recommended every 1-2 years to screen for glaucoma; cataracts, macular degeneration, and other eye disorders.   A preventive dental visit is recommended every 6 months.   Try to get at least 150 minutes of exercise per week or 10,000 steps per day on a pedometer .   Order or download the FREE "Exercise & Physical Activity: Your Everyday Guide" from [employer]. Call [phone] or search [employer] online.   You need 1200-1500 mg of calcium and 1000-2000 IU of vitamin D per day. It is possible to meet your calcium requirement with diet alone, but a vitamin D supplement is usually necessary to meet this goal.   When exposed to the sun, use a sunscreen that protects against both UVA and UVB radiation with an SPF of 30 or greater. Reapply every 2 to 3 hours or after sweating, drying off with a towel, or swimming.   Always wear a seat belt when traveling in a car. Always wear a helmet when riding a bicycle or motorcycle.    Heart-Healthy Diet   Sodium, Fat, and Cholesterol Controlled Diet       What Is a Heart Healthy Diet?   A heart-healthy  diet is one that limits sodium , certain types of fat , and cholesterol . This type of diet is recommended for:   People with any form of cardiovascular disease (eg, coronary heart disease , peripheral vascular disease , previous heart attack , previous stroke )   People with risk factors for cardiovascular disease, such as high blood pressure , high cholesterol , or diabetes   Anyone who wants to lower their risk of developing cardiovascular disease   Sodium    Sodium is a mineral found in many foods. In general, most people consume much more sodium than they need. Diets high in sodium can increase blood pressure and lead to edema (water retention). On a heart-healthy diet, you should consume no more than 2,300 mg (milligrams) of sodium per dayabout the amount in one teaspoon of table salt. The foods highest in sodium include table salt (about 50% sodium), processed foods, convenience foods, and preserved foods.   Cholesterol    Cholesterol is a fat-like, waxy substance in your blood. Our bodies make some cholesterol. It is also found in animal products, with the highest amounts in fatty meat, egg yolks, whole milk, cheese, shellfish, and organ meats. On a heart-healthy diet, you should limit your cholesterol intake to less than 200 mg per day.   It is normal and important to have some cholesterol in your bloodstream. But too much cholesterol can cause plaque to build up within your arteries,  which can eventually lead to a heart attack or stroke.   The two types of cholesterol that are most commonly referred to are:   Low-density lipoprotein (LDL) cholesterol  Also known as bad cholesterol, this is the cholesterol that tends to build up along your arteries. Bad cholesterol levels are increased by eating fats that are saturated or hydrogenated. Optimal level of this cholesterol is less than 100. Over 130 starts to get risky for heart disease.   High-density lipoprotein (HDL) cholesterol  Also known as good  cholesterol, this type of cholesterol actually carries cholesterol away from your arteries and may, therefore, help lower your risk of having a heart attack. You want this level to be high (ideally greater than 60). It is a risk to have a level less than 40. You can raise this good cholesterol by eating olive oil, canola oil, avocados, or nuts. Exercise raises this level, too.   Fat    Fat is calorie dense and packs a lot of calories into a small amount of food. Even though fats should be limited due to their high calorie content, not all fats are bad. In fact, some fats are quite healthful. Fat can be broken down into four main types.   The good-for-you fats are:   Monounsaturated fat  found in oils such as olive and canola, avocados, and nuts and natural nut butters; can decrease cholesterol levels, while keeping levels of HDL cholesterol high   Polyunsaturated fat  found in oils such as safflower, sunflower, soybean, corn, and sesame; can decrease total cholesterol and LDL cholesterol   Omega-3 fatty acids  particularly those found in fatty fish (such as salmon, trout, tuna, mackerel, herring, and sardines); can decrease risk of arrhythmias, decrease triglyceride levels, and slightly lower blood pressure   The fats that you want to limit are:   Saturated fat  found in animal products, many fast foods, and a few vegetables; increases total blood cholesterol, including LDL levels   Animal fats that are saturated include: butter, lard, whole-milk dairy products, meat fat, and poultry skin   Vegetable fats that are saturated include: hydrogenated shortening, palm oil, coconut oil, cocoa butter   Hydrogenated or trans fat  found in margarine and vegetable shortening, most shelf stable snack foods, and fried foods; increases LDL and decreases HDL     It is generally recommended that you limit your total fat for the day to less than 30% of your total calories. If you follow an 1800-calorie heart healthy diet, for  example, this would mean 60 grams of fat or less per day.   Saturated fat and trans fat in your diet raises your blood cholesterol the most, much more than dietary cholesterol does. For this reason, on a heart-healthy diet, less than 7% of your calories should come from saturated fat and ideally 0% from trans fat. On an 1800-calorie diet, this translates into less than 14 grams of saturated fat per day, leaving 46 grams of fat to come from mono- and polyunsaturated fats.   Food Choices on a Heart Healthy Diet   Food Category   Foods Recommended   Foods to Avoid   Grains   Breads and rolls without salted tops Most dry and cooked cereals Unsalted crackers and breadsticks Low-sodium or homemade breadcrumbs or stuffing All Couser and pastas   Breads, rolls, and crackers with salted tops High-fat baked goods (eg, muffins, donuts, pastries) Quick breads, self-rising flour, and biscuit mixes Regular bread crumbs Instant hot  cereals Commercially prepared [place], pasta, or stuffing mixes   Vegetables   Most fresh, frozen, and low-sodium canned vegetables Low-sodium and salt-free vegetable juices Canned vegetables if unsalted or rinsed   Regular canned vegetables and juices, including sauerkraut and pickled vegetables Frozen vegetables with sauces Commercially prepared potato and vegetable mixes   Fruits   Most fresh, frozen, and canned fruits All fruit juices   Fruits processed with salt or sodium   Milk   Nonfat or low-fat (1%) milk Nonfat or low-fat yogurt Cottage cheese, low-fat ricotta, cheeses labeled as low-fat and low-sodium   Whole milk Reduced-fat (2%) milk Malted and chocolate milk Full fat yogurt Most cheeses (unless low-fat and low salt) Buttermilk (no more than 1 cup per week)   Meats and Beans   Lean cuts of fresh or frozen beef, veal, lamb, or pork (look for the word loin) Fresh or frozen poultry without the skin Fresh or frozen fish and some shellfish Egg whites and egg substitutes (Limit whole eggs to three per  week) Tofu Nuts or seeds (unsalted, dry-roasted), low-sodium peanut butter Dried peas, beans, and lentils   Any smoked, cured, salted, or canned meat, fish, or poultry (including bacon, chipped beef, cold cuts, hot dogs, sausages, sardines, and anchovies) Poultry skins Breaded and/or fried fish or meats Canned peas, beans, and lentils Salted nuts   Fats and Oils   Olive oil and canola oil Low-sodium, low-fat salad dressings and mayonnaise   Butter, margarine, coconut and palm oils, bacon fat   Snacks, Sweets, and Condiments   Low-sodium or unsalted versions of broths, soups, soy sauce, and condiments Pepper, herbs, and spices; vinegar, lemon, or lime juice Low-fat frozen desserts (yogurt, sherbet, fruit bars) Sugar, cocoa powder, honey, syrup, jam, and preserves Low-fat, trans-fat free cookies, cakes, and pies Graham and animal crackers, fig bars, ginger snaps   High-fat desserts Broth, soups, gravies, and sauces, made from instant mixes or other high-sodium ingredients Salted snack foods Canned olives Meat tenderizers, seasoning salt, and most flavored vinegars   Beverages   Low-sodium carbonated beverages Tea and coffee in moderation Soy milk   Commercially softened water   Suggestions   Make whole grains, fruits, and vegetables the base of your diet.    Choose heart-healthy fats such as canola, olive, and flaxseed oil, and foods high in heart-healthy fats, such as nuts, seeds, soybeans, tofu, and fish.    Eat fish at least twice per week; the fish highest in omega-3 fatty acids and lowest in mercury include salmon, herring, mackerel, sardines, and canned chunk light tuna. If you eat fish less than twice per week or have high triglycerides, talk to your doctor about taking fish oil supplements.    Read food labels.   For products low in fat and cholesterol, look for fat free, low-fat, cholesterol free, saturated fat free, and trans fat freeAlso scan the Nutrition Facts Label, which lists saturated fat, trans fat,  and cholesterol amounts.   For products low in sodium, look for sodium free, very low sodium, low sodium, no added salt, and unsalted   Skip the salt when cooking or at the table; if food needs more flavor, get creative and try out different herbs and spices. Garlic and onion also add substantial flavor to foods.    Trim any visible fat off meat and poultry before cooking, and drain the fat off after browning.    Use cooking methods that require little or no added fat, such as grilling, boiling, baking,  poaching, broiling, roasting, steaming, stir-frying, and sauting.    Avoid fast food and convenience food. They tend to be high in saturated and trans fat and have a lot of added salt.    Talk to a registered dietitian for individualized diet advice.      Last Reviewed: March 2011 Suzanne Boron, MS, MPH, RD   Updated: 03/25/2010        Preventing Osteoporosis: After Your Visit  Your Care Instructions  Osteoporosis means the bones are weak and thin enough that they can break easily. The older you are, the more likely you are to get osteoporosis. But with plenty of calcium, vitamin D, and exercise, you can help prevent osteoporosis.  The preteen and teen years are a key time for bone building. With the help of calcium, vitamin D, and exercise in those early years and beyond, the bones reach their peak density and strength by age 39. After age 39, your bones naturally start to thin and weaken.  The stronger your bones are at around age 53, the lower your risk for osteoporosis. But no matter what your age and risk are, your bones still need calcium, vitamin D, and exercise to stay strong. Also avoid smoking, and limit alcohol. Smoking and heavy alcohol use can make your bones thinner.  Talk to your doctor about any special risks you might have, such as having a close relative with osteoporosis or taking a medicine that can weaken bones. Your doctor can tell you the best ways to protect your bones from thinning.  Follow-up  care is a key part of your treatment and safety. Be sure to make and go to all appointments, and call your doctor if you are having problems. It's also a good idea to know your test results and keep a list of the medicines you take.  How can you care for yourself at home?  Get enough calcium and vitamin D. The Institute of Medicine recommends adults younger than age 3 need 1,000 mg of calcium and 600 IU of vitamin D each day. Women ages 33 to 16 need 1,200 mg of calcium and 600 IU of vitamin D each day. Men ages 73 to 55 need 1,000 mg of calcium and 600 IU of vitamin D each day. Adults 71 and older need 1,200 mg of calcium and 800 IU of vitamin D each day.  Eat foods rich in calcium, like yogurt, cheese, milk, and dark green vegetables.  Eat foods rich in vitamin D, like eggs, fatty fish, cereal, and fortified milk.  Get some sunshine. Your body uses sunshine to make its own vitamin D. The safest time to be out in the sun is before 10 a.m. or after 3 p.m. Avoid getting sunburned. Sunburn can increase your risk of skin cancer.  Talk to your doctor about taking a calcium plus vitamin D supplement. Ask about what type of calcium is right for you, and how much to take at a time. Adults ages 59 to 22 should not get more than 2,500 mg of calcium and 4,000 IU of vitamin D each day, whether it is from supplements and/or food. Adults ages 40 and older should not get more than 2,000 mg of calcium and 4,000 IU of vitamin D each day from supplements and/or food.  Get regular bone-building exercise. Weight-bearing and resistance exercises keep bones healthy by working the muscles and bones against gravity. Start out at an exercise level that feels right for you. Add a little at a  time until you can do the following:  Do 30 minutes of weight-bearing exercise on most days of the week. Walking, jogging, stair climbing, and dancing are good choices.  Do resistance exercises with weights or elastic bands 2 to 3 days a week.  Limit  alcohol. Drink no more than 1 alcohol drink a day if you are a woman. Drink no more than 2 alcohol drinks a day if you are a man.  Do not smoke. Smoking can make bones thin faster. If you need help quitting, talk to your doctor about stop-smoking programs and medicines. These can increase your chances of quitting for good.  When should you call for help?  Watch closely for changes in your health, and be sure to contact your doctor if:  You need help with a healthy eating plan.  You need help with an exercise plan     2006-2012 [employer]. Care instructions adapted under license by [facility]. This care instruction is for use with your licensed healthcare professional. If you have questions about a medical condition or this instruction, always ask your healthcare professional. [place] any warranty or liability for your use of this information.  Content Version: 9.4.94723; Last Revised: June 16, 2010                   Keep Your Memory Hervey Ard       Many factors can affect your ability to remembera hectic lifestyle, aging, stress, chronic disease, and certain medicines. But, there are steps you can take to sharpen your mind and help preserve your memory.   Challenge Your Brain   Regularly challenging your mind may help keeps it in top shape. Good mental exercises include:   Crossword puzzlesUse a dictionary if you need it; you will learn more that way.   Brainteasers Try some!   Crafts, such as wood working and Financial risk analyst, such as gardening and Research officer, political party old friends or join groups to meet new ones.   Reading   Learning a new language   Taking a class, whether it be art history or tai chi   TravelingExperience the food, history, and culture of your destination   Learning to use a computer   Going to museums, the theater, or thought-provoking movies   Changing things in your daily life, such as reversing your pattern in the  grocery store or brushing your teeth using your nondominant hand   Use Memory Aids   There is no need to remember every detail on your own. These memory aids can help:   Calendars and day planners   Electronic organizers to store all sorts of helpful informationThese devices can "beep" to remind you of appointments.   A book of days to record birthdays, anniversaries, and other occasions that occur on the same date every year   Detailed "to-do" lists and strategically placed sticky notes   Quick "study" sessionsBefore a gathering, review who will be there so their names will be fresh in your mind.   Establish routinesFor example, keep your keys, wallet, and umbrella in the same place all the time or take medicine with your 8:00 AM glass of juice   Live a Healthy Life   Many actions that will keep your body strong will do the same for your mind. For example:   Talk to Your Doctor About Herbs and Supplements    Malnutrition and vitamin deficiencies can impair your mental function.  For example, vitamin B12 deficiency can cause a range of symptoms, including confusion. But, what if your nutritional needs are being met? Can herbs and supplements still offer a benefit? Researchers have investigated a range of natural remedies, such as ginkgo , ginseng , and the supplement phosphatidylserine (PS). So far, though, the evidence is inconsistent as to whether these products can improve memory or thinking.   If you are interested in taking herbs and supplements, talk to your doctor first because they may interact with other medicines that you are taking.   Exercise Regularly    Among the many benefits of regular exercise are increased blood flow to the brain and decreased risk of certain diseases that can interfere with memory function. One study found that even moderate exercise has a beneficial effect. Examples of "moderate" exercise include:   Playing 18 holes of golf once a week, without a cart   Playing tennis twice a week    Walking one mile per day   Manage Stress    It can be tough to remember what is important when your mind is cluttered. Make time for relaxation. Choose activities that calm you down, and make it routine.   Manage Chronic Conditions    Side effects of high blood pressure , diabetes, and heart disease can interfere with mental function. Many of the lifestyle steps discussed here can help manage these conditions. Strive to eat a healthy diet, exercise regularly, get stress under control, and follow your doctor's advice for your condition.   Minimize Medications    Talk to your doctor about the medicines that you take. Some may be unnecessary. Also, healthy lifestyle habits may lower the need for certain drugs.     Last Reviewed: April 2010 Lajuana Carry, MD   Updated: 04/09/2009        Keeping Home a [place]       As we get older, changes in balance, gait, strength, vision, hearing, and cognition make even the most youthful senior more prone to accidents. Falls are one of the leading health risks for older people. This increased risk of falling is related to:   Aging process (eg, decreased muscle strength, slowed reflexes)   Higher incidence of chronic health problems (eg, arthritis, diabetes) that may limit mobility, agility or sensory awareness   Side effects of medicine (eg, dizziness, blurred vision)especially medicines like prescription pain medicines and drugs used to treat mental health conditions   Depending on the brittleness of your bones, the consequences of a fall can be serious and long lasting.   Home Life   Research by the Association of Aging [facility]) shows that some home accidents among older adults can be prevented by making simple lifestyle changes and basic modifications and repairs to the home environment. Here are some lifestyle changes that experts recommend:   Have your hearing and vision checked regularly. Be sure to wear prescription glasses that are right for you.   Speak to your doctor or  pharmacist about the possible side effects of your medicines. A number of medicines can cause dizziness.   If you have problems with sleep, talk to your doctor.   Limit your intake of alcohol.   If necessary, use a cane or walker to help maintain your balance.   Wear supportive, rubber-soled shoes, even at home. If you live in a region that gets wintry weather, you may want to put special cleats on your shoes to prevent you from slipping on the snow  and ice.   Exercise regularly to help maintain muscle tone, agility, and balance.   Always hold the banister when going up or down stairs. Also, use grip bars when getting in or out of the bath or shower, or using the toilet.   To avoid dizziness, get up slowly from a lying down position. Sit up first, dangling your legs for a minute or two before rising to a standing position.   Overall Home Safety Check   According to the Consumer Product Safety Commision's "Older Consumer Home Safety Checklist," it is important to check for potential hazards in each room. And remember, proper lighting is an essential factor in home safety. If you cannot see clearly, you are more likely to fall.   Important questions to ask yourself include:   Are lamp, electric, extension, and telephone cords placed out of the flow of traffic and maintained in good condition? Have frayed cords been replaced?   Are all small rugs and runners slip resistant? If not, you can secure them to the floor with a special double-sided carpet tape.   Are smoke detectors properly locatedone on every floor of your home and one outside of every sleeping area? Are they in good working order? Are batteries replaced at least once a year?   Do you have a well-maintained carbon monoxide detector outside every sleeping are in your home?   Does your furniture layout leave plenty of space to maneuver between and around chairs, tables, beds, and sofas?   Are hallways, stairs and passages between rooms well lit? Can you reach  a lamp without getting out of bed?   Are floor surfaces well maintained? Shag rugs, high-pile carpeting, tile floors, and polished wood floors can be particularly slippery. Stairs should always have handrails and be carpeted or fitted with a non-skid tread.   Is your telephone easily reachable. Is the cord safely tucked away?   Room by Room   According to the Association of Aging, bathrooms and kitchens are the two most potentially hazardous rooms in your home.   In the Kitchen    Be sure your stove is in proper working order and always make sure burners and the oven are off before you go out or go to sleep.    Keep pots on the back burners, turn handles away from the front of the stove, and keep stove clean and free of grease build-up.    Kitchen ventilation systems and range exhausts should be working properly.    Keep flammable objects such as towels and pot holders away from the cooking area except when in use. Make sure kitchen curtains are tied back.    Move cords and appliances away from the sink and hot surfaces. If extension cords are needed, install wiring guides so they do not hang over the sink, range, or working areas.    Look for coffee pots, kettles and toaster ovens with automatic shut-offs.    Keep a mop handy in the kitchen so you can wipe up spills instantly. You should also have a small Data processing manager.    Arrange your kitchen with frequently used items on lower shelves to avoid the need to stand on a stepstool to reach them.    Make sure countertops are well-lit to avoid injuries while cutting and preparing food.    In the Bathroom    Use a non-slip mat or decals in the tub and shower, since wet, soapy tile or porcelain surfaces are extremely slippery.  Make sure bathroom rugs are non-skid or tape them firmly to the floor. Bathtubs should have at least one, preferably two, grab bars, firmly attached to structural supports in the wall. (Do not use built-in soap holders or glass shower doors  as grab bars.)    Tub seats fitted with non-slip material on the legs allow you to wash sitting down. For people with limited mobility, bathtub transfer benches allow you to slide safely into the tub.    Raised toilet seats and toilet safety rails are helpful for those with knee or hip problems.    In the [place]    Make sure you use a nightlight and that the area around your bed is clear of potential obstacles.    Be careful with electric blankets and never go to sleep with a heating pad, which can cause serious burns even if on a low setting.    Use fire-resistant mattress covers and pillows, and NEVER smoke in bed.    Keep a phone next to the bed that is programmed to dial 911 at the push of a button.      If you have a chronic condition, you may want to sign on with an automatic call-in service. Typically the system includes a small pendant that connects directly to an emergency medical voice-response system. You should also make arrangements to stay in contact with someonefriend, neighbor, family memberon a regular schedule.   Fire Prevention   According to the [employer]. (Smoke Alarms for Every) [place], senior citizens are one of the two highest risk groups for death and serious injuries due to residential fires.   When cooking, wear short-sleeved items, never a bulky long-sleeved robe.    The CPSC's Safety Checklist for Older Consumers emphasizes the importance of checking basements, garages, workshops and storage areas for fire hazards, such as volatile liquids, piles of old rags or clothing and overloaded circuits.    Never smoke in bed or when lying down on a couch or recliner chair.    Small portable electric or kerosene heaters are responsible for many home fires and should be used cautiously if at all. If you do use one, be sure to keep them away from flammable materials.    In case of fire, make sure you have a pre-established emergency exit plan.    Have a professional check your  fireplace and other fuel-burning appliances yearly.    Helping Hands   Baby boomers entering the golden years will continue to see the development of new products to help older adults live safely and independently in spite of age-related changes.  Making Life More Livable  , by [employer], lists over 1,000 products for "living well in the mature years," such as bathing and mobility aids, household security devices, ergonomically designed knives and peelers, and faucet valves and knobs for temperature control. Medical supply stores and organizations are good sources of information about products that improve your quality of life and insure your safety.     Last Reviewed: November 2009 Lajuana Carry, MD   Updated: 03/03/2010

## 2018-02-07 NOTE — Progress Notes (Signed)
Medicare Annual Wellness Visit  Name: Lisa York Date: 02/07/2018   MRN: [id number] Sex: Female   Age: 67 y.o. Ethnicity: Non-Hispanic/Non Latino   DOB: 17-Jun-1951 Race: Lisa York is here for Medicare AWV    Screenings for behavioral, psychosocial and functional/safety risks, and cognitive dysfunction are all negative except as indicated below. These results, as well as other patient data from the Health Risk Assessment form, are documented in Flowsheets linked to this Encounter.    No Known Allergies    Prior to Visit Medications    Medication Sig Taking? Authorizing Provider   PARoxetine (PAXIL) 40 MG tablet TAKE 1 TABLET BY MOUTH DAILY Yes Daine Floras, APRN - CNP   omeprazole (PRILOSEC) 40 MG delayed release capsule TAKE 1 CAPSULE BY MOUTH DAILY Yes Daine Floras, APRN - CNP   fluticasone (FLONASE) 50 MCG/ACT nasal spray 1 spray by Nasal route daily Yes Kimberley D Jodrey, APRN - CNP   albuterol sulfate HFA (PROAIR HFA) 108 (90 Base) MCG/ACT inhaler INHALE 2 PUFFS INTO THE LUNGS EVERY 6 HOURS AS NEEDED FOR WHEEZING Yes Kimberley D Jodrey, APRN - CNP   zolpidem (AMBIEN) 5 MG tablet TAKE 1 TABLET BY MOUTH NIGHTLY AS NEEDED FOR SLEEP Yes Louann Sjogren, APRN - CNP   ibuprofen (ADVIL;MOTRIN) 600 MG tablet TAKE 1 TABLET BY MOUTH EVERY 8 HOURS AS NEEDED FOR PAIN Yes Louann Sjogren, APRN - CNP       Past Medical History:   Diagnosis Date   . Anxiety    . PONV (postoperative nausea and vomiting)      Past Surgical History:   Procedure Laterality Date   . APPENDECTOMY     . CHOLECYSTECTOMY  06/18/2014    Laparoscopic   . FOOT SURGERY Left     x 2   . HERNIA REPAIR     . KNEE SURGERY Left    . SHOULDER SURGERY Right    . TUBAL LIGATION         Family History   Problem Relation Age of Onset   . Cancer Mother         colon   . Cancer Sister         colon   . Cancer Brother         colon   . Cancer Brother         bladder, lung, throat   . Cancer Brother         prostate   . Cancer Brother          skin       CareTeam (Including outside providers/suppliers regularly involved in providing care):   Patient Care Team:  Mirna Mires, MD as PCP - General (Family Medicine)  Louann Sjogren, APRN - CNP as PCP - [facility] Attributed Provider  Denver Chauncey Cruel, MD (Orthopedic Surgery)    Wt Readings from Last 3 Encounters:   02/07/18 195 lb 9.6 oz (88.7 kg)   05/11/17 196 lb (88.9 kg)   09/09/16 199 lb (90.3 kg)     Vitals:    02/07/18 1508   BP: 138/84   Site: Right Upper Arm   Position: Sitting   Cuff Size: Large Adult   Pulse: 86   Temp: 98.1 F (36.7 C)   TempSrc: Oral   SpO2: 97%   Weight: 195 lb 9.6 oz (88.7 kg)   Height: 5\' 7"  (1.702 m)  Body mass index is 30.64 kg/m.    Based upon direct observation of the patient, evaluation of cognition reveals recent and remote memory intact.    Patient's complete Health Risk Assessment and screening values have been reviewed and are found in Flowsheets. The following problems were reviewed today and where indicated follow up appointments were made and/or referrals ordered.    Positive Risk Factor Screenings with Interventions:     Substance Abuse:  Social History     Tobacco History     Smoking Status  Current Every Day Smoker Smoking Start Date  02/07/1965 Smoking Frequency  1 pack/day for 53 years (53 pk yrs) Smoking Tobacco Type  Cigarettes    Smokeless Tobacco Use  Never Used          Alcohol History     Alcohol Use Status  Yes          Drug Use     Drug Use Status  Not Asked          Sexual Activity     Sexually Active  Not Currently               Audit Questionnaire: Screen for Alcohol Misuse  How often do you have a drink containing alcohol?: Two to three times a week  How many standard drinks containing alcohol do you have on a typical day when drinking?: Ten or more  How often do you have six or more drinks on one occasion?: Less than monthly  Audit-C Score: 8  During the past year, how often have you found that you were not able to stop drinking once  you had started?: Never  During the past year, how often have you failed to do what was normally expected of you because of drinking?: Never  During the past year, how often have you needed a drink in the morning to get yourself going after a heavy drinking session?: Never  During the past year, how often have you had a feeling of guilt or remorse after drinking?: Never  During the past year, have you been unable to remember what happened the night before because you had been drinking?: Never  Have you or someone else been injured as a result of your drinking?: No  Has a relative or friend, doctor or health worker been concerned about your drinking or suggested you cut down?: No  Total Score: 8  Alcohol Misuse Interpretation: (!) Female Audit Score 8-12:  Hazardous Drinking  Substance Abuse Interventions:   Tobacco abuse:  patient is not ready to work toward tobacco cessation at this time    Health Habits/Nutrition:  Health Habits/Nutrition  Do you exercise for at least 20 minutes 2-3 times per week?: (!) No  Have you lost any weight without trying in the past 3 months?: No  Do you eat fewer than 2 meals per day?: No  Have you seen a dentist within the past year?: Yes  Body mass index is 30.64 kg/m.  Health Habits/Nutrition Interventions:   Inadequate physical activity:  educational materials provided to promote increased physical activity    Personalized Preventive Plan   Current Health Maintenance Status  Immunization History   Administered Date(s) Administered   . Influenza Virus Vaccine 09/21/2013, 09/24/2014   . Influenza, High Dose (Fluzone 65 yrs and older) 11/05/2015, 09/09/2016, 10/26/2017   . Pneumococcal 13-valent Conjugate (Prevnar13) 10/16/2014   . Pneumococcal Polysaccharide (Pneumovax23) 09/09/2016        Health Maintenance   Topic  Date Due   . Hepatitis C screen  [birth year]   . DTaP/Tdap/Td vaccine (1 - Tdap) 09/08/1968   . Diabetes screen  09/08/1989   . Shingles Vaccine (1 of 2 - 2 Dose Series)  09/09/1999   . Colon cancer screen colonoscopy  12/23/2014   . Breast cancer screen  02/25/2018   . Lipid screen  04/28/2020   . DEXA (modify frequency per FRAX score)  Completed   . Flu vaccine  Completed   . Pneumococcal low/med risk  Completed     Requested Colonoscopy from [facility] was completed in 2016 by Dr. Jasmine Decemberebo     Recommendations for Preventive Services Due: see orders and patient instructions/AVS.  .  Recommended screening schedule for the next 5-10 years is provided to the patient in written form: see Patient Instructions/AVS.    I, Gari CrownStephanie Fussnecker, LPN, 9/60/45402/10/2018, performed the documented evaluation under the direct supervision of the attending physician.    This encounter was performed under my, Mirna Miresyler Boomer Winders, MD's, direct supervision, 02/07/2018.

## 2018-02-14 ENCOUNTER — Encounter

## 2018-02-14 MED ORDER — FLUTICASONE PROPIONATE 50 MCG/ACT NA SUSP
50 | Freq: Every day | NASAL | 5 refills | Status: AC
Start: 2018-02-14 — End: ?

## 2018-02-14 MED ORDER — ALBUTEROL SULFATE HFA 108 (90 BASE) MCG/ACT IN AERS
108 | RESPIRATORY_TRACT | 5 refills | Status: AC
Start: 2018-02-14 — End: ?

## 2018-02-14 NOTE — Telephone Encounter (Signed)
Appt Today

## 2018-02-17 ENCOUNTER — Ambulatory Visit: Payer: MEDICARE | Primary: Family Medicine

## 2018-06-16 ENCOUNTER — Encounter: Attending: Family Medicine | Primary: Family Medicine

## 2018-06-16 ENCOUNTER — Encounter: Attending: Family | Primary: Family Medicine

## 2018-07-21 ENCOUNTER — Other Ambulatory Visit: Payer: Self-pay

## 2018-07-21 ENCOUNTER — Emergency Department (HOSPITAL_BASED_OUTPATIENT_CLINIC_OR_DEPARTMENT_OTHER): Payer: Medicare HMO

## 2018-07-21 ENCOUNTER — Emergency Department (HOSPITAL_BASED_OUTPATIENT_CLINIC_OR_DEPARTMENT_OTHER)
Admission: EM | Admit: 2018-07-21 | Discharge: 2018-07-21 | Disposition: A | Discharge status: Home / Self Care | Payer: Medicare HMO | Attending: Emergency Medicine | Admitting: Emergency Medicine

## 2018-07-21 ENCOUNTER — Encounter (HOSPITAL_BASED_OUTPATIENT_CLINIC_OR_DEPARTMENT_OTHER): Payer: Self-pay | Admitting: Emergency Medicine

## 2018-07-21 DIAGNOSIS — Y92003 Bedroom of unspecified non-institutional (private) residence as the place of occurrence of the external cause: Secondary | ICD-10-CM | POA: Diagnosis not present

## 2018-07-21 DIAGNOSIS — M25551 Pain in right hip: Secondary | ICD-10-CM | POA: Insufficient documentation

## 2018-07-21 DIAGNOSIS — I10 Essential (primary) hypertension: Secondary | ICD-10-CM | POA: Diagnosis not present

## 2018-07-21 DIAGNOSIS — M25552 Pain in left hip: Secondary | ICD-10-CM | POA: Insufficient documentation

## 2018-07-21 DIAGNOSIS — T148XXA Other injury of unspecified body region, initial encounter: Secondary | ICD-10-CM

## 2018-07-21 DIAGNOSIS — W06XXXA Fall from bed, initial encounter: Secondary | ICD-10-CM | POA: Diagnosis not present

## 2018-07-21 DIAGNOSIS — Y999 Unspecified external cause status: Secondary | ICD-10-CM | POA: Diagnosis not present

## 2018-07-21 DIAGNOSIS — W19XXXA Unspecified fall, initial encounter: Secondary | ICD-10-CM

## 2018-07-21 DIAGNOSIS — Z96642 Presence of left artificial hip joint: Secondary | ICD-10-CM | POA: Diagnosis not present

## 2018-07-21 DIAGNOSIS — Y939 Activity, unspecified: Secondary | ICD-10-CM | POA: Insufficient documentation

## 2018-07-21 DIAGNOSIS — M25521 Pain in right elbow: Secondary | ICD-10-CM | POA: Insufficient documentation

## 2018-07-21 HISTORY — DX: Other psychoactive substance abuse, uncomplicated: F19.10

## 2018-07-21 HISTORY — DX: Depression, unspecified: F32.A

## 2018-07-21 HISTORY — DX: Major depressive disorder, single episode, unspecified: F32.9

## 2018-07-21 HISTORY — DX: Essential (primary) hypertension: I10

## 2018-07-21 HISTORY — DX: Other amnesia: R41.3

## 2018-07-21 NOTE — ED Provider Notes (Signed)
[facility] EMERGENCY DEPARTMENT Provider Note   CSN: [id number] Arrival date & time: 07/21/18  1523     History   Chief Complaint Chief Complaint  Patient presents with  . Fall    HPI Nancy Newman is a 67 y.o. female.  The history is provided by the patient, a relative and medical records.  Fall  This is a new problem. The current episode started 6 to 12 hours ago. The problem occurs rarely. The problem has been resolved. Pertinent negatives include no chest pain, no abdominal pain, no headaches and no shortness of breath. Nothing aggravates the symptoms. Nothing relieves the symptoms. She has tried nothing for the symptoms. The treatment provided no relief.    Past Medical History:  Diagnosis Date  . Guillain-Barre (HCC)    in 1986 poss due to flu shot  . Hypertension   . Seizures (HCC)    last seizure was 10 yrs ago    There are no active problems to display for this patient.   Past Surgical History:  Procedure Laterality Date  . JOINT REPLACEMENT     Left hip     OB History   None      Home Medications    Prior to Admission medications   Not on File    Family History No family history on file.  Social History Social History   Tobacco Use  . Smoking status: Never Smoker  . Smokeless tobacco: Never Used  Substance Use Topics  . Alcohol use: Yes    Comment: per Husband the pt. has a drinking problem and he pours the alcohol out.  . Drug use: Not on file     Allergies   Penicillins   Review of Systems Review of Systems  Constitutional: Negative for chills, fatigue and fever.  HENT: Negative for congestion.   Respiratory: Negative for cough, chest tightness, shortness of breath and wheezing.   Cardiovascular: Negative for chest pain, palpitations and leg swelling.  Gastrointestinal: Negative for abdominal pain, constipation, diarrhea, nausea and vomiting.  Genitourinary: Negative for dysuria, flank pain and frequency.    Musculoskeletal: Negative for back pain, neck pain and neck stiffness.  Skin: Positive for color change (bruising). Negative for rash and wound.  Neurological: Negative for weakness, light-headedness, numbness and headaches.  Psychiatric/Behavioral: Negative for agitation and confusion.  All other systems reviewed and are negative.    Physical Exam Updated Vital Signs BP (!) 164/78 (BP Location: Right Arm)   Pulse 93   Temp 98.5 F (36.9 C) (Oral)   Resp 18   Wt 45.4 kg (100 lb)   SpO2 100%   BMI 19.53 kg/m   Physical Exam  Constitutional: She is oriented to person, place, and time. She appears well-developed and well-nourished. No distress.  HENT:  Head: Normocephalic and atraumatic.  Mouth/Throat: Oropharynx is clear and moist. No oropharyngeal exudate.  Eyes: Pupils are equal, round, and reactive to light. Conjunctivae and EOM are normal.  Neck: Normal range of motion. Neck supple.  Cardiovascular: Normal rate and regular rhythm.  No murmur heard. Pulmonary/Chest: Effort normal and breath sounds normal. No respiratory distress. She has no wheezes. She has no rales. She exhibits no tenderness.  Abdominal: Soft. There is no tenderness.  Musculoskeletal: She exhibits tenderness. She exhibits no edema or deformity.       Right elbow: She exhibits normal range of motion, no swelling, no effusion, no deformity and no laceration. Tenderness found.       Right  knee: She exhibits ecchymosis. Tenderness found.       Left knee: She exhibits ecchymosis. Tenderness found.  Symmetric grip strength.  Small bruising seen on right lateral elbow.  Minimally tender.  Normal sensation bilaterally.  Symmetric DP and PT pulse bilaterally.  Normal sensation in legs.  Bruising seen on both knees.  Normal leg and knee range of motion's.  Neurological: She is alert and oriented to person, place, and time. No sensory deficit. She exhibits normal muscle tone.  Skin: Skin is warm and dry. Capillary  refill takes less than 2 seconds. No rash noted. She is not diaphoretic. No erythema.  Psychiatric: She has a normal mood and affect.  Nursing note and vitals reviewed.    ED Treatments / Results  Labs (all labs ordered are listed, but only abnormal results are displayed) Labs Reviewed - No data to display  EKG None  Radiology Dg Elbow Complete Right  Result Date: 07/21/2018 CLINICAL DATA:  Fall. EXAM: RIGHT ELBOW - COMPLETE 3+ VIEW COMPARISON:  No prior. FINDINGS: Diffuse degenerative change. Subtle bony densities adjacent to the radial head and the coronoid process are most likely degenerative. Subtle fracture fragments cannot be excluded. Subtle elbow joint effusion cannot be excluded. No radiopaque foreign body. IMPRESSION: Diffuse degenerative change right elbow. Subtle bony densities noted adjacent to the radial head and coronoid process are most likely degenerative. Subtle fracture fragments cannot be excluded. Subtle elbow joint effusion cannot be excluded. Electronically Signed   By: Maisie Fus  Register   On: 07/21/2018 16:38   Dg Knee Complete 4 Views Left  Result Date: 07/21/2018 CLINICAL DATA:  Fall. EXAM: LEFT KNEE - COMPLETE 4+ VIEW COMPARISON:  Right knee series same day. FINDINGS: Sclerotic changes noted the distal femur and proximal tibia. 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 Similar findings are noted to a lesser degree about the right femur and tibia. No acute bony or joint abnormality. IMPRESSION: No acute bony or joint abnormality. 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 Electronically Signed   By: Maisie Fus  Register   On: 07/21/2018 16:40   Dg Knee Complete 4 Views Right  Result Date: 07/21/2018 CLINICAL DATA:  Fall today. Bilateral knee pain with redness and bruising. Initial encounter. EXAM: RIGHT KNEE - COMPLETE 4+ VIEW COMPARISON:  None. FINDINGS: The bones appear  diffusely osteopenic. No acute fracture, dislocation, or knee joint effusion is identified. A 9 mm well-defined lesion with sclerotic margin in the proximal tibial metadiaphysis has an appearance favoring a benign process. A similar lesion is partially visualized more distally in the tibial shaft, and there is also mixed heterogeneous lucency and sclerosis in the distal femur. More extensive changes are present about the left knee, and these are favored to reflect old bone infarcts. The soft tissues are unremarkable. IMPRESSION: No acute osseous abnormality identified. Electronically Signed   By: Sebastian Ache M.D.   On: 07/21/2018 16:46   Dg Hips Bilat W Or Wo Pelvis 3-4 Views  Result Date: 07/21/2018 CLINICAL DATA:  Status post fall at home this morning. Bilateral hip pain similarly greater on the left than on the right. History of left hip joint prosthesis placement. EXAM: DG HIP (WITH OR WITHOUT PELVIS) 3-4V BILAT COMPARISON:  None in PACs FINDINGS: The bones are subjectively osteopenic. There is severe deformity of the right hip joint which appears chronic. There is complete flattening and near-total bony resorption of the right femoral head with  flattening of the normal contour of the acetabulum. No acute fracture of the residual right hip is observed. On the left the prosthetic joint is intact. The interface of the prosthetic components with the native bone appears normal. There is a double density that projects along the inferior aspect of the acetabulum in the extreme lateral aspects of the superior and inferior pubic rami. I cannot exclude a fracture here. Without previous studies with which to compare it is difficult to judge if the finding is acute or old. 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 No normal appearing right hip joint is present. No definite acute fracture of the right hemipelvis or proximal right femur.  Normal appearance of the prosthetic left hip joint. Possible fracture involving the extreme lateral aspects of the superior and inferior pubic rami as they join to form the base of the left acetabulum. Electronically Signed   By: David  [place] M.D.   On: 07/21/2018 16:42    Procedures Procedures (including critical care time)  Medications Ordered in ED Medications - No data to display   Initial Impression / Assessment and Plan / ED Course  I have reviewed the triage vital signs and the nursing notes.  Pertinent labs & imaging results that were available during my care of the patient were reviewed by me and considered in my medical decision making (see chart for details).     Eddy Termine is a 67 y.o. female with past medical history significant for hypertension, remote history of Guillain-Barr, and prior seizures who presents with extremity pains after a fall.  Patient reports that when she got this morning to urinate from the bed, she had a mechanical fall.  She reports her legs got tripped up landing on her extremities.  She reports bruising and pain to both knees, both hips, and her right elbow.  She is left-handed.  She denies headache, loss of consciousness, headache, or neck pain.  She denies any numbness, tingling, or weakness of extremities at this time.  She denies chest pain, back pain or shortness of breath.  No low back pain or shooting pains.  No loss of bowel or bladder control.  Patient reports pain is mild to moderate at this time.    Patient is primarily concerned to get a history of left hip surgery and wants to make sure that nothing new got injured in her hips or joints.  On exam, patient has bruising and palpable tenderness on both knees.  Patient has no significant tenderness in the hips however patient was noted to have a leg length difference with the right shorter than left.  Patient reported pain earlier in both of her hips but it has improved since she has been here.   Patient has bruising and tenderness in the lateral right elbow.  Symmetric grip strength, normal sensation, and normal pulses palpated.  Chest was nontender and lungs were clear.  Abdomen nontender.  Patient appeared well and had no neck range of motion of normalities or tenderness.  No significant bruising of the head seen.    Clinically I suspect patient had a mechanical fall and has soft tissue injuries however she will have x-rays of the elbow, hips and pelvis, and knees.    Anticipate discharge if work-up is reassuring and we do not see evidence of bony abnormality.  5:47 PM Diagnostic x-rays showed no evidence of new fractures.  Degenerative disease was seen.  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  Left replacement appears without fracture or problem.  Left pubic rami had possible fracture however on my reassessment, patient has no tenderness in this location and was able to ambulate without pelvic pain.  She does not want CT imaging to further confirm or deny fracture at this time.  X-ray of the elbow show degenerative change and possible fracture but again she moves her elbow normally without further tenderness.  Very low suspicion for elbow fracture at this time.    Patient is requesting discharge given her x-rays that did not show large fractures.  She does not want any further imaging to confirm the possible pelvic fracture but is interested in following with orthopedics.  Patient was given the orthopedic number to call to follow-up.  Patient instructed to use conservative management and patient understood return precautions.  Patient discharged in good condition.   Final Clinical Impressions(s) / ED Diagnoses   Final diagnoses:  Fall, initial encounter  Bilateral hip pain  Right elbow pain  Bruising    ED Discharge Orders    None      Clinical Impression: 1. Fall, initial encounter   2. Bilateral hip pain   3. Right elbow pain   4. Bruising      Disposition: Discharge  Condition: Good  I have discussed the results, Dx and Tx plan with the pt(& family if present). He/she/they expressed understanding and agree(s) with the plan. Discharge instructions discussed at great length. Strict return precautions discussed and pt &/or family have verbalized understanding of the instructions. No further questions at time of discharge.    New Prescriptions   No medications on file    Follow Up: Lenda Kelp, MD [address] [place] [address] [phone]     [facility] [address] [id number] [id number] [place] [address] [phone]       Tegeler, Canary Brim, MD 07/21/18 [phone]

## 2018-07-21 NOTE — ED Triage Notes (Signed)
Pt fell getting out of bed this morning. Pt c/o L leg pain. Denies LOC.

## 2018-07-21 NOTE — ED Notes (Signed)
ED Provider at bedside. 

## 2018-07-21 NOTE — Discharge Instructions (Signed)
Your x-rays today showed possible fracture in the pelvis however as you are able to ambulate and had no tenderness, I doubt fracture at this time.  We did see extensive degenerative changes and feel he need to follow-up with orthopedics.  If any symptoms change or worsen, please return to the nearest emergency department.

## 2018-07-25 ENCOUNTER — Ambulatory Visit: Payer: Medicare HMO | Admitting: Family Medicine

## 2018-07-28 ENCOUNTER — Ambulatory Visit: Payer: Medicare HMO | Admitting: Family Medicine

## 2018-08-04 ENCOUNTER — Encounter: Payer: Self-pay | Admitting: Family Medicine

## 2018-08-04 ENCOUNTER — Ambulatory Visit: Payer: Medicare HMO | Admitting: Family Medicine

## 2018-08-04 ENCOUNTER — Ambulatory Visit (HOSPITAL_BASED_OUTPATIENT_CLINIC_OR_DEPARTMENT_OTHER)
Admission: RE | Admit: 2018-08-04 | Discharge: 2018-08-04 | Disposition: A | Discharge status: Home / Self Care | Payer: Medicare HMO | Source: Ambulatory Visit | Attending: Family Medicine | Admitting: Family Medicine

## 2018-08-04 VITALS — BP 167/71 | HR 70 | Ht 60.0 in | Wt 110.0 lb

## 2018-08-04 DIAGNOSIS — M25552 Pain in left hip: Secondary | ICD-10-CM

## 2018-08-04 DIAGNOSIS — M79662 Pain in left lower leg: Secondary | ICD-10-CM | POA: Diagnosis not present

## 2018-08-04 NOTE — Patient Instructions (Addendum)
Get a doppler ultrasound to rule out a blood clot - I will call you with the results and next steps.

## 2018-08-04 NOTE — Progress Notes (Signed)
PCP: Patient, No Pcp Per  Subjective:   HPI: Patient is a 67 y.o. female here for ED follow-up for pelvic fracture.  Patient fell on 07/21/2018.  Since then she has been ambulatory with a walker.  She reports only 3/10 pain on occasion, soreness.  Ultimately, she has been getting around well.  Regarding her fall, patient states she got tangled in bed sheets and tripped getting out of bed.  There were no reported symptoms from the fall to warrant concern for a syncopal episode.  Today her primary concern is persistent left lower extremity swelling from the mid tibia down.  It is noted that this has improved slightly over the past several days.  She denies any associated dyspnea or chest pain.  She has no history of VTE.  She does not take any hormonal medications.  She denies any recent travel.  She denies any numbness or tingling in the extremity.  No bruising.  Her daughter does note the left lower extremity previously being erythematous.  Past Medical History:  Diagnosis Date  . Depression   . Guillain-Barre (HCC)    in 1986 poss due to flu shot  . Hypertension   . Memory loss   . Seizures (HCC)    last seizure was 10 yrs ago  . Substance abuse (HCC)     Current Outpatient Medications on File Prior to Visit  Medication Sig Dispense Refill  . levETIRAcetam (KEPPRA) 100 MG/ML solution Take by mouth 2 (two) times daily.     No current facility-administered medications on file prior to visit.     Past Surgical History:  Procedure Laterality Date  . JOINT REPLACEMENT     Left hip    Allergies  Allergen Reactions  . Penicillins Anaphylaxis    Social History   Socioeconomic History  . Marital status: Married    Spouse name: Not on file  . Number of children: Not on file  . Years of education: Not on file  . Highest education level: Not on file  Occupational History  . Not on file  Social Needs  . Financial resource strain: Not on file  . Food insecurity:    Worry: Not on  file    Inability: Not on file  . Transportation needs:    Medical: Not on file    Non-medical: Not on file  Tobacco Use  . Smoking status: Never Smoker  . Smokeless tobacco: Never Used  Substance and Sexual Activity  . Alcohol use: Yes    Comment: per Husband the pt. has a drinking problem and he pours the alcohol out.  . Drug use: Not on file  . Sexual activity: Not on file  Lifestyle  . Physical activity:    Days per week: Not on file    Minutes per session: Not on file  . Stress: Not on file  Relationships  . Social connections:    Talks on phone: Not on file    Gets together: Not on file    Attends religious service: Not on file    Active member of club or organization: Not on file    Attends meetings of clubs or organizations: Not on file    Relationship status: Not on file  . Intimate partner violence:    Fear of current or ex partner: Not on file    Emotionally abused: Not on file    Physically abused: Not on file    Forced sexual activity: Not on file  Other Topics  Concern  . Not on file  Social History Narrative  . Not on file    No family history on file.  BP (!) 167/71   Pulse 70   Ht 5' (1.524 m)   Wt 110 lb (49.9 kg)   BMI 21.48 kg/m   Review of Systems: See HPI above.     Objective:  Physical Exam:  Gen: awake, alert, NAD, comfortable in exam room Pulm: breathing unlabored  Left LE: - Inspection: No obvious bony deformity.  No erythema.  There is 2+ pitting edema from the mid tibia to the foot.  No bruising noted - Palpation: Patient does acknowledge pain with calf squeeze.  No TTP over bony or tendinous or ligamentous landmarks. No tenderness with  palpation of the knee.  No tenderness over the greater trochanter - Strength: Normal strength with dorsiflexion, plantarflexion, inversion, and eversion of the ankle.  Normal strength with flexion-extension at the knee - ROM: Full ROM of the knee and ankle - Neuro/vasc: Neurovascula intact with  symmetric dorsalis pedis pulses  Right LE: - Inspection: No obvious deformity, erythema, swelling, or ecchymosis - Palpation: No TTP of the knee or ankle.  No pain over the greater trochanter - Strength: 5/5 strength at knee and ankle. normal strength and no pain with hip extension adduction or abduction with resistance - ROM: Full ROM of the knee and ankle  - Neuro/vasc: NV intact   Assessment & Plan:  1.  Left lower extremity swelling: Given her recent fall, and with lower extremity swelling calf tenderness immediate concern for DVT.  Patient was scheduled for Doppler ultrasound. Pt will be called with results of her US. If DVT present will start on Xarelto.  2.  Status post pubic ramus fracture: X-rays of the right and left knee and hips and right elbow obtained in the ED reviewed today.  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  Reassured patient - even if had subtle pubic ramus fracture treatment is conservative with walker, weight bearing as tolerated.  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 - At this time, recommend continued ambulation with walker as needed for support and as pain permits

## 2018-09-07 ENCOUNTER — Ambulatory Visit: Admit: 2018-09-07 | Discharge: 2018-09-07 | Payer: MEDICARE | Attending: Family Medicine | Primary: Family Medicine

## 2018-09-07 DIAGNOSIS — R109 Unspecified abdominal pain: Secondary | ICD-10-CM

## 2018-09-07 LAB — COMPREHENSIVE METABOLIC PANEL
ALT: 55 U/L — ABNORMAL HIGH (ref 10–40)
AST: 35 U/L (ref 15–37)
Albumin/Globulin Ratio: 1.9 (ref 1.1–2.2)
Albumin: 5 g/dL (ref 3.4–5.0)
Alkaline Phosphatase: 102 U/L (ref 40–129)
Anion Gap: 16 (ref 3–16)
BUN: 7 mg/dL (ref 7–20)
CO2: 25 mmol/L (ref 21–32)
Calcium: 10.1 mg/dL (ref 8.3–10.6)
Chloride: 100 mmol/L (ref 99–110)
Creatinine: 0.6 mg/dL (ref 0.6–1.2)
GFR African American: >60 (ref >60)
GFR Non-African American: >60 (ref >60)
Globulin: 2.6 g/dL
Glucose: 143 mg/dL — ABNORMAL HIGH (ref 70–99)
Potassium: 5 mmol/L (ref 3.5–5.1)
Sodium: 141 mmol/L (ref 136–145)
Total Bilirubin: 0.8 mg/dL (ref 0.0–1.0)
Total Protein: 7.6 g/dL (ref 6.4–8.2)

## 2018-09-07 LAB — CBC WITH AUTO DIFFERENTIAL
Basophils %: 0.7 %
Basophils Absolute: 0.1 10*3/uL (ref 0.0–0.2)
Eosinophils %: 2.4 %
Eosinophils Absolute: 0.2 10*3/uL (ref 0.0–0.6)
Hematocrit: 45.6 % (ref 36.0–48.0)
Hemoglobin: 15.9 g/dL (ref 12.0–16.0)
Lymphocytes %: 33.3 %
Lymphocytes Absolute: 3.2 10*3/uL (ref 1.0–5.1)
MCH: 31.9 pg (ref 26.0–34.0)
MCHC: 34.9 g/dL (ref 31.0–36.0)
MCV: 91.5 fL (ref 80.0–100.0)
MPV: 8.6 fL (ref 5.0–10.5)
Monocytes %: 4.7 %
Monocytes Absolute: 0.5 10*3/uL (ref 0.0–1.3)
Neutrophils %: 58.9 %
Neutrophils Absolute: 5.7 10*3/uL (ref 1.7–7.7)
Platelets: 294 10*3/uL (ref 135–450)
RBC: 4.98 M/uL (ref 4.00–5.20)
RDW: 13.2 % (ref 12.4–15.4)
WBC: 9.6 10*3/uL (ref 4.0–11.0)

## 2018-09-07 LAB — LIPID PANEL
Cholesterol, Total: 225 mg/dL — ABNORMAL HIGH (ref 0–199)
HDL: 39 mg/dL — ABNORMAL LOW (ref 40–60)
LDL Calculated: 142 mg/dL — ABNORMAL HIGH (ref <100)
Triglycerides: 221 mg/dL — ABNORMAL HIGH (ref 0–150)
VLDL Cholesterol Calculated: 44 mg/dL

## 2018-09-07 LAB — POCT URINALYSIS DIPSTICK
Bilirubin, UA: NEGATIVE
Glucose, UA POC: NEGATIVE
Ketones, UA: NEGATIVE
Nitrite, UA: NEGATIVE
Protein, UA POC: NEGATIVE
Spec Grav, UA: 1.015
Urobilinogen, UA: 0.2
pH, UA: 6

## 2018-09-07 LAB — HEPATITIS C ANTIBODY: Hep C Ab Interp: NONREACTIVE

## 2018-09-07 MED ORDER — CIPROFLOXACIN HCL 500 MG PO TABS
500 MG | ORAL_TABLET | Freq: Two times a day (BID) | ORAL | 0 refills | Status: AC
Start: 2018-09-07 — End: 2018-09-14

## 2018-09-07 NOTE — Progress Notes (Signed)
Chief Complaint   Patient presents with   ??? Flank Pain       HPI:  Lisa York is a 67 y.o. (DOB: 1951/03/20) here today   for   Flank Pain   This is a new problem. Episode onset: 3 weeks ago. The problem occurs constantly. The problem has been gradually worsening since onset. The pain is present in the thoracic spine. The quality of the pain is described as aching. The pain is at a severity of 6/10. The pain is severe. The pain is the same all the time. The symptoms are aggravated by position. Pertinent negatives include no dysuria or fever. Risk factors include menopause. She has tried NSAIDs for the symptoms. The treatment provided mild relief.     Pt does have urinary frequency.  Denies any pain with urination.    Does have chronic nausea.    Due for rpt labs.  Has not had done in some time.     Patient's medications, allergies, past medical, surgical, social and family histories were reviewed and updated asappropriate.    ROS:  Review of Systems   Constitutional: Negative for fever.   Gastrointestinal: Positive for nausea (chronic).   Genitourinary: Positive for flank pain and frequency. Negative for dysuria.   Musculoskeletal: Positive for back pain.           Prior to Visit Medications    Medication Sig Taking? Authorizing Provider   ciprofloxacin (CIPRO) 500 MG tablet Take 1 tablet by mouth 2 times daily for 7 days Yes Margaretmary Dys, MD   fluticasone [facility]) 50 MCG/ACT nasal spray 1 spray by Nasal route daily Yes Margaretmary Dys, MD   albuterol sulfate HFA (PROAIR HFA) 108 (90 Base) MCG/ACT inhaler INHALE 2 PUFFS INTO THE LUNGS EVERY 6 HOURS AS NEEDED FOR WHEEZING Yes Margaretmary Dys, MD   PARoxetine (PAXIL) 40 MG tablet TAKE 1 TABLET BY MOUTH DAILY Yes Dorise Hiss, APRN - CNP   omeprazole (PRILOSEC) 40 MG delayed release capsule TAKE 1 CAPSULE BY MOUTH DAILY Yes Dorise Hiss, APRN - CNP   ibuprofen (ADVIL;MOTRIN) 600 MG tablet TAKE 1 TABLET BY MOUTH EVERY 8 HOURS AS NEEDED FOR PAIN Yes Providence Crosby, APRN - CNP       No Known Allergies    OBJECTIVE:    BP (!) 146/90    Pulse 94    Ht 5' 4.96" (1.65 m)    Wt 193 lb (87.5 kg)    SpO2 96%    BMI 32.16 kg/m??     BP Readings from Last 2 Encounters:   09/07/18 (!) 146/90   02/07/18 138/84       Wt Readings from Last 3 Encounters:   09/07/18 193 lb (87.5 kg)   02/07/18 195 lb 9.6 oz (88.7 kg)   05/11/17 196 lb (88.9 kg)       Physical Exam   Constitutional: She is oriented to person, place, and time. She appears well-developed and well-nourished.   HENT:   Head: Normocephalic and atraumatic.   Eyes: Conjunctivae and EOM are normal.   Neck: No tracheal deviation present.   Cardiovascular: Normal rate and regular rhythm.   Pulmonary/Chest: Effort normal and breath sounds normal.   Abdominal: Soft. There is tenderness. There is CVA tenderness (left).   Musculoskeletal: She exhibits no edema.   Neurological: She is alert and oriented to person, place, and time.   Skin: Skin is warm and dry.   Psychiatric: She has a  normal mood and affect.         ASSESSMENT/PLAN:     1. Acute left flank pain  ua pos for LE and blood.  Given sxs, treat as uti.  Consider CT or u/s if ongoing issues.   - POCT Urinalysis no Micro  - URINE CULTURE    2. Urinary frequency  See above  - POCT Urinalysis no Micro  - URINE CULTURE  - CBC Auto Differential  - ciprofloxacin (CIPRO) 500 MG tablet; Take 1 tablet by mouth 2 times daily for 7 days  Dispense: 14 tablet; Refill: 0    3. Need for influenza vaccination    - INFLUENZA, TRIV, INACTIVATED, SUBUNIT, ADJUVANTED, 65 YRS AND OLDER, IM, PREFILL SYR, 0.5ML (FLUAD TRIV)    4. Personal history of tobacco use  Discussed options.    - PR VISIT TO DISCUSS LUNG CA SCREEN W LDCT  - CT Lung Screen (Annual); Future    5. Breast cancer screening    - MAM DIGITAL SCREEN W CAD BILATERAL; Future    6. Mixed hyperlipidemia  Rpt labs  - Lipid Panel  - Comprehensive Metabolic Panel    7. Screening for diabetes mellitus (DM)    - Hemoglobin A1C    8. Encounter  for hepatitis C screening test for low risk patient    - Hepatitis C Antibody    9. Elevated glucose    - Hemoglobin A1C          Scribe attestation: I, Alanson Aly, am scribing for and in the presence of Margaretmary Dys, MD. Electronically signed by Alanson Aly CMA on 09/07/2018 at 8:53 AM      Provider attestation: I, Margaretmary Dys, MD, personally performed the services scribed by the user listed above in my presence, and it is both accurate and complete. I agree with the ROS and Past Histories independently gathered by the clinical support staff and the remaining scribed note accurately describes my personal service to the patient.    '@SIGNATURE'$ @    09/07/2018  8:56 AM      Low Dose CT (LDCT) Lung Screening criteria met   Age 38-77   Pack year smoking >30   Still smoking or less than 15 year since quit   No sign or symptoms of lung cancer   > 11 months since last LDCT     Risks and benefits of??lung cancer screening with LDCT scans discussed:    Significance of positive screen - False-positive LDCT results often occur.??95% of all positive results do not lead to a diagnosis of cancer. Usually further imaging can resolve most false-positive results; however, some patients may require invasive procedures.    Over diagnosis risk - 10% to 12% of screen-detected lung cancer cases are over diagnosed--that is, the cancer would not have been detected in the patient's lifetime without the screening.    Need for follow up screens annually to continue lung cancer screening effectiveness     Risks associated with radiation from annual LDCT- Radiation exposure is about the same as for a mammogram, which is about 1/3 of the annual background radiation exposure from everyday life.  Starting screening at age 16 is not likely to increase cancer risk from radiation exposure.    Patients with comorbidities resulting in life expectancy of < 10 years, or that would preclude treatment of an abnormality identified on CT, should not be  screened due to lack of benefit.    To obtain maximal benefit from this screening,  smoking cessation and long-term abstinence from smoking is critical

## 2018-09-07 NOTE — Progress Notes (Signed)
Vaccine Information Sheet, "Influenza - Inactivated"  given to Lisa York, or parent/legal guardian of  Lisa York and verbalized understanding.    Patient responses:    Have you ever had a reaction to a flu vaccine? No  Are you able to eat eggs without adverse effects?  Yes  Do you have any current illness?  No  Have you ever had Guillian Barre Syndrome?  No    Flu vaccine given per order. Please see immunization tab.

## 2018-09-07 NOTE — Patient Instructions (Addendum)
Patient Education        Stopping Smoking: Care Instructions  Your Care Instructions  Cigarette smokers crave the nicotine in cigarettes. Giving it up is much harder than simply changing a habit. Your body has to stop craving the nicotine. It is hard to quit, but you can do it. There are many tools that people use to quit smoking. You may find that combining tools works best for you.  There are several steps to quitting. First you get ready to quit. Then you get support to help you. After that, you learn new skills and behaviors to become a nonsmoker. For many people, a necessary step is getting and using medicine.  Your doctor will help you set up the plan that best meets your needs. You may want to attend a smoking cessation program to help you quit smoking. When you choose a program, look for one that has proven success. Ask your doctor for ideas. You will greatly increase your chances of success if you take medicine as well as get counseling or join a cessation program.  Some of the changes you feel when you first quit tobacco are uncomfortable. Your body will miss the nicotine at first, and you may feel short-tempered and grumpy. You may have trouble sleeping or concentrating. Medicine can help you deal with these symptoms. You may struggle with changing your smoking habits and rituals. The last step is the tricky one: Be prepared for the smoking urge to continue for a time. This is a lot to deal with, but keep at it. You will feel better.  Follow-up care is a key part of your treatment and safety. Be sure to make and go to all appointments, and call your doctor if you are having problems. It's also a good idea to know your test results and keep a list of the medicines you take.  How can you care for yourself at home?  ?? Ask your family, friends, and coworkers for support. You have a better chance of quitting if you have help and support.  ?? Join a support group, such as Nicotine Anonymous, for people who are  trying to quit smoking.  ?? Consider signing up for a smoking cessation program, such as the American Lung Association's Freedom from Smoking program.  ?? Get text messaging support. Go to the website at www.smokefree.gov to sign up for the [facility] program.  ?? Set a quit date. Pick your date carefully so that it is not right in the middle of a big deadline or stressful time. Once you quit, do not even take a puff. Get rid of all ashtrays and lighters after your last cigarette. Clean your house and your clothes so that they do not smell of smoke.  ?? Learn how to be a nonsmoker. Think about ways you can avoid those things that make you reach for a cigarette.  ? Avoid situations that put you at greatest risk for smoking. For some people, it is hard to have a drink with friends without smoking. For others, they might skip a coffee break with coworkers who smoke.  ? Change your daily routine. Take a different route to work or eat a meal in a different place.  ?? Cut down on stress. Calm yourself or release tension by doing an activity you enjoy, such as reading a book, taking a hot bath, or gardening.  ?? Talk to your doctor or pharmacist about nicotine replacement therapy, which replaces the nicotine in your body. You  still get nicotine but you do not use tobacco. Nicotine replacement products help you slowly reduce the amount of nicotine you need. These products come in several forms, many of them available over-the-counter:  ? Nicotine patches  ? Nicotine gum and lozenges  ? Nicotine inhaler  ?? Ask your doctor about bupropion (Wellbutrin) or varenicline (Chantix), which are prescription medicines. They do not contain nicotine. They help you by reducing withdrawal symptoms, such as stress and anxiety.  ?? Some people find hypnosis, acupuncture, and massage helpful for ending the smoking habit.  ?? Eat a healthy diet and get regular exercise. Having healthy habits will help your body move past its craving for  nicotine.  ?? Be prepared to keep trying. Most people are not successful the first few times they try to quit. Do not get mad at yourself if you smoke again. Make a list of things you learned and think about when you want to try again, such as next week, next month, or next year.  Where can you learn more?  Go to [email or web address] and sign in to your MyChart account. Enter Y522 in the Search Health Information box to learn more about "Stopping Smoking: Care Instructions."     If you do not have an account, please click on the "Sign Up Now" link.  Current as of: September 22, 2017  Content Version: 12.1  ?? 2006-2019 [employer]. Care instructions adapted under license by [facility]. If you have questions about a medical condition or this instruction, always ask your healthcare professional. [employer] disclaims any warranty or liability for your use of this information.         What is lung cancer screening?  Lung cancer screening is a way in which doctors check the lungs for early signs of cancer in people who have no symptoms of lung cancer.  A low-dose CT scan uses much less radiation than a normal CT scan and shows a more detailed image of the lungs than a standard X-ray.  The goal of lung cancer screening is to find cancer early, before it has a chance to grow, spread, or cause problems.  One large study found that smokers who were screened with low-dose CT scans were less likely to die of lung cancer than those who were screened with standard X-ray.    Below is a summary of the things you need to know regarding screening for lung cancer with low-dose computed tomography (LDCT).  This is a screening program that involves routine annual screening with LDCT studies of the lung.  The LDCTs are done using low-dose radiation that is not thought to increase your cancer risk.  If you have other serious medical conditions (other cancers, congestive heart failure) that  limit your life expectancy to less than 10 years, you should not undergo lung cancer screening with LDCT.  The chance is 20%-60% that the LDCT result will show abnormalities.  This would require additional testing which could include repeat imaging or even invasive procedures.  Most (about 95%) of "abnormal" LDCT results are false in the sense that no lung cancer is ultimately found.  Additionally, some (about 10%) of the cancers found would not affect your life expectancy, even if undetected and untreated.  If you are still smoking, the single most important thing that you can do to reduce your risk of dying of lung cancer is to quit.    For this screening to be covered by Medicare and most other insurers, strict criteria   must be met.  If you do not meet these criteria, but still wish to undergo LDCT testing, you will be required to sign a waiver indicating your willingness to pay for the scan.

## 2018-09-08 ENCOUNTER — Encounter

## 2018-09-08 LAB — HEMOGLOBIN A1C
Estimated Avg Glucose: 142.7 mg/dL
Hemoglobin A1C: 6.6 %

## 2018-09-08 LAB — CULTURE, URINE: Urine Culture, Routine: <50000

## 2018-09-08 MED ORDER — ROSUVASTATIN CALCIUM 5 MG PO TABS
5 MG | ORAL_TABLET | ORAL | 3 refills | Status: AC
Start: 2018-09-08 — End: 2019-02-02

## 2018-09-08 NOTE — Other (Signed)
ha1c in the range of dm.  Not taking meds.  Work on diet.  Rpt in 4 mo.  Hep c neg. Lipids elevated, but improved from last check. cmp ok except glucose, mild inc in alt.  Cbc normal. Add crestor 5mg  m,w,f.  Cmp, lipids, ha1c 4 mo

## 2018-09-08 NOTE — Other (Signed)
rec CT of abd and pelvis w/o contrast given location of pain and findings on urine. Possible kidney stone

## 2018-09-08 NOTE — Other (Signed)
Mixed flora on urine culture.  Finish cipro.  Feeling better

## 2018-09-20 ENCOUNTER — Inpatient Hospital Stay: Admit: 2018-09-20 | Payer: MEDICARE | Primary: Family Medicine

## 2018-09-20 ENCOUNTER — Encounter

## 2018-09-20 DIAGNOSIS — Z87891 Personal history of nicotine dependence: Secondary | ICD-10-CM

## 2018-09-20 DIAGNOSIS — Z1231 Encounter for screening mammogram for malignant neoplasm of breast: Secondary | ICD-10-CM

## 2018-09-20 NOTE — Other (Signed)
No kidney stone.  Diverticulosis, but no evidence of diverticulitis.  Does not explain pain.  If ongoing, rec f/u appt

## 2018-09-20 NOTE — Other (Signed)
Mild emphysema and small nodules.  Appear benign. Rpt in 1 yr

## 2018-09-20 NOTE — Other (Signed)
No evidence of malignancy.  Repeat mammogram in approximately 1 year

## 2018-10-03 ENCOUNTER — Ambulatory Visit: Admit: 2018-10-03 | Discharge: 2018-10-03 | Payer: MEDICARE | Attending: Family Medicine | Primary: Family Medicine

## 2018-10-03 DIAGNOSIS — M5442 Lumbago with sciatica, left side: Secondary | ICD-10-CM

## 2018-10-03 MED ORDER — IBUPROFEN 800 MG PO TABS
800 MG | ORAL_TABLET | Freq: Three times a day (TID) | ORAL | 0 refills | Status: DC
Start: 2018-10-03 — End: 2018-10-13

## 2018-10-03 MED ORDER — TIZANIDINE HCL 4 MG PO TABS
4 MG | ORAL_TABLET | Freq: Three times a day (TID) | ORAL | 0 refills | Status: DC
Start: 2018-10-03 — End: 2018-10-13

## 2018-10-03 MED ORDER — METHYLPREDNISOLONE 4 MG PO TBPK
4 MG | PACK | ORAL | 0 refills | Status: AC
Start: 2018-10-03 — End: 2018-10-09

## 2018-10-03 NOTE — Other (Signed)
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  This may be contributing to her symptoms.  Consider MRI or Ortho referral if symptoms persist

## 2018-10-03 NOTE — Progress Notes (Signed)
Chief Complaint   Patient presents with   ??? Lower Back Pain       HPI:  Lisa York is a 67 y.o. (DOB: [birth year]) here today   for lower back pain.  Pt is getting worse. Does take Ibuprofen for symptomatic relief.  Ongoing/ worsening pain to left flank.  Has been taking ibu 882m and tylenol to ease pain.  Mild relief.  occas ice, occas heat.  Does help some, but ongoing issues.  No known recent injury to the back.  occas pain down lateral aspect of left leg.  Assoc nausea.  No fevers.  Does feel hot at times.  Some diff controlling bladder, but that not new.  No vaginal numbness.  Pain > weakness.  No numbness noted.    HPI    Patient's medications, allergies, past medical, surgical, social and family histories were reviewed and updated asappropriate.    ROS:  Review of Systems   Constitutional: Negative for fever.   Respiratory: Negative for shortness of breath.    Cardiovascular: Negative for chest pain.   Gastrointestinal: Negative for abdominal pain, blood in stool, constipation and diarrhea.   Genitourinary: Negative for difficulty urinating.   Musculoskeletal: Positive for back pain.   Neurological: Negative for weakness and numbness.           Prior to Visit Medications    Medication Sig Taking? Authorizing Provider   methylPREDNISolone (MEDROL DOSEPACK) 4 MG tablet Take by mouth. Yes TMargaretmary Dys MD   tiZANidine (ZANAFLEX) 4 MG tablet Take 1 tablet by mouth 3 times daily Yes TMargaretmary Dys MD   ibuprofen (ADVIL;MOTRIN) 800 MG tablet Take 1 tablet by mouth 3 times daily (with meals) Yes TMargaretmary Dys MD   rosuvastatin (CRESTOR) 5 MG tablet Take 1 tablet by mouth three times a week Yes TMargaretmary Dys MD   fluticasone ([facility] MCG/ACT nasal spray 1 spray by Nasal route daily Yes TMargaretmary Dys MD   albuterol sulfate HFA (PROAIR HFA) 108 (90 Base) MCG/ACT inhaler INHALE 2 PUFFS INTO THE LUNGS EVERY 6 HOURS AS NEEDED FOR WHEEZING Yes TMargaretmary Dys MD   PARoxetine (PAXIL) 40 MG tablet TAKE 1  TABLET BY MOUTH DAILY Yes SDorise Hiss APRN - CNP   omeprazole (PRILOSEC) 40 MG delayed release capsule TAKE 1 CAPSULE BY MOUTH DAILY Yes SDorise Hiss APRN - CNP       No Known Allergies    OBJECTIVE:    BP (!) 168/100    Pulse 90    Ht 5' 4.96" (1.65 m)    Wt 194 lb (88 kg)    SpO2 97%    BMI 32.32 kg/m??     BP Readings from Last 2 Encounters:   10/03/18 (!) 168/100   09/07/18 (!) 146/90       Wt Readings from Last 3 Encounters:   10/03/18 194 lb (88 kg)   09/07/18 193 lb (87.5 kg)   02/07/18 195 lb 9.6 oz (88.7 kg)       Physical Exam   Constitutional: She is oriented to person, place, and time. She appears well-developed.   HENT:   Head: Normocephalic and atraumatic.   Eyes: EOM are normal.   Neck: No tracheal deviation present.   Cardiovascular: Normal rate and regular rhythm.   Pulmonary/Chest: Effort normal and breath sounds normal.   Abdominal: Soft. There is no tenderness.   Musculoskeletal:        Thoracic back: She exhibits tenderness and spasm.  Back:    Neurological: She is alert and oriented to person, place, and time. No sensory deficit.   Strength normal ble   Psychiatric: She has a normal mood and affect.         ASSESSMENT/PLAN:     1. Acute left-sided low back pain with left-sided sciatica  Trial of steroid pack and muscle relaxer.  Refill ibu.  Reviewed CT abd/pelvis. No stone.  xrays as below.   Consider referral if not better and testing unrevealing  - methylPREDNISolone (MEDROL DOSEPACK) 4 MG tablet; Take by mouth.  Dispense: 1 kit; Refill: 0  - tiZANidine (ZANAFLEX) 4 MG tablet; Take 1 tablet by mouth 3 times daily  Dispense: 30 tablet; Refill: 0  - XR LUMBOSACRAL W OBLIQUES AND FLEXION AND EXTENSION; Future  - XR THORACIC SPINE (MIN 4 VIEWS); Future  - ibuprofen (ADVIL;MOTRIN) 800 MG tablet; Take 1 tablet by mouth 3 times daily (with meals)  Dispense: 90 tablet; Refill: 0    2. Abnormal glucose  Discussed.  Pt plans to monitor diet. Rpt in 4-6 mo.

## 2018-10-03 NOTE — Patient Instructions (Signed)
Patient Education        Stopping Smoking: Care Instructions  Your Care Instructions  Cigarette smokers crave the nicotine in cigarettes. Giving it up is much harder than simply changing a habit. Your body has to stop craving the nicotine. It is hard to quit, but you can do it. There are many tools that people use to quit smoking. You may find that combining tools works best for you.  There are several steps to quitting. First you get ready to quit. Then you get support to help you. After that, you learn new skills and behaviors to become a nonsmoker. For many people, a necessary step is getting and using medicine.  Your doctor will help you set up the plan that best meets your needs. You may want to attend a smoking cessation program to help you quit smoking. When you choose a program, look for one that has proven success. Ask your doctor for ideas. You will greatly increase your chances of success if you take medicine as well as get counseling or join a cessation program.  Some of the changes you feel when you first quit tobacco are uncomfortable. Your body will miss the nicotine at first, and you may feel short-tempered and grumpy. You may have trouble sleeping or concentrating. Medicine can help you deal with these symptoms. You may struggle with changing your smoking habits and rituals. The last step is the tricky one: Be prepared for the smoking urge to continue for a time. This is a lot to deal with, but keep at it. You will feel better.  Follow-up care is a key part of your treatment and safety. Be sure to make and go to all appointments, and call your doctor if you are having problems. It's also a good idea to know your test results and keep a list of the medicines you take.  How can you care for yourself at home?  ?? Ask your family, friends, and coworkers for support. You have a better chance of quitting if you have help and support.  ?? Join a support group, such as Nicotine Anonymous, for people who are  trying to quit smoking.  ?? Consider signing up for a smoking cessation program, such as the American Lung Association's Freedom from Smoking program.  ?? Get text messaging support. Go to the website at www.smokefree.gov to sign up for the SmokefreeTXT program.  ?? Set a quit date. Pick your date carefully so that it is not right in the middle of a big deadline or stressful time. Once you quit, do not even take a puff. Get rid of all ashtrays and lighters after your last cigarette. Clean your house and your clothes so that they do not smell of smoke.  ?? Learn how to be a nonsmoker. Think about ways you can avoid those things that make you reach for a cigarette.  ? Avoid situations that put you at greatest risk for smoking. For some people, it is hard to have a drink with friends without smoking. For others, they might skip a coffee break with coworkers who smoke.  ? Change your daily routine. Take a different route to work or eat a meal in a different place.  ?? Cut down on stress. Calm yourself or release tension by doing an activity you enjoy, such as reading a book, taking a hot bath, or gardening.  ?? Talk to your doctor or pharmacist about nicotine replacement therapy, which replaces the nicotine in your body. You   still get nicotine but you do not use tobacco. Nicotine replacement products help you slowly reduce the amount of nicotine you need. These products come in several forms, many of them available over-the-counter:  ? Nicotine patches  ? Nicotine gum and lozenges  ? Nicotine inhaler  ?? Ask your doctor about bupropion (Wellbutrin) or varenicline (Chantix), which are prescription medicines. They do not contain nicotine. They help you by reducing withdrawal symptoms, such as stress and anxiety.  ?? Some people find hypnosis, acupuncture, and massage helpful for ending the smoking habit.  ?? Eat a healthy diet and get regular exercise. Having healthy habits will help your body move past its craving for  nicotine.  ?? Be prepared to keep trying. Most people are not successful the first few times they try to quit. Do not get mad at yourself if you smoke again. Make a list of things you learned and think about when you want to try again, such as next week, next month, or next year.  Where can you learn more?  Go to [email or web address] and sign in to your MyChart account. Enter Y522 in the Search Health Information box to learn more about "Stopping Smoking: Care Instructions."     If you do not have an account, please click on the "Sign Up Now" link.  Current as of: September 22, 2017  Content Version: 12.1  ?? 2006-2019 [employer]. Care instructions adapted under license by [facility]. If you have questions about a medical condition or this instruction, always ask your healthcare professional. [employer] disclaims any warranty or liability for your use of this information.

## 2018-10-03 NOTE — Other (Signed)
Mild degenerative changes of the thoracic spine.  See lumbar spine imaging as below.  Consider MRI or Ortho referral if symptoms persist

## 2018-10-03 NOTE — Telephone Encounter (Signed)
Last office visit 09/07/18, advised to follow-up 18m, next appointment 0.

## 2018-10-04 ENCOUNTER — Encounter

## 2018-10-13 ENCOUNTER — Encounter

## 2018-10-13 MED ORDER — TIZANIDINE HCL 4 MG PO TABS
4 MG | ORAL_TABLET | Freq: Three times a day (TID) | ORAL | 0 refills | Status: DC
Start: 2018-10-13 — End: 2019-02-02

## 2018-10-13 MED ORDER — IBUPROFEN 800 MG PO TABS
800 MG | ORAL_TABLET | Freq: Three times a day (TID) | ORAL | 0 refills | Status: DC
Start: 2018-10-13 — End: 2018-11-08

## 2018-10-18 ENCOUNTER — Ambulatory Visit: Admit: 2018-10-18 | Discharge: 2018-10-18 | Payer: MEDICARE | Attending: Physician Assistant | Primary: Family Medicine

## 2018-10-18 DIAGNOSIS — M47816 Spondylosis without myelopathy or radiculopathy, lumbar region: Secondary | ICD-10-CM

## 2018-10-18 MED ORDER — TIZANIDINE HCL 4 MG PO TABS
4 MG | ORAL_TABLET | Freq: Every evening | ORAL | 0 refills | Status: AC
Start: 2018-10-18 — End: 2018-11-17

## 2018-10-18 NOTE — Progress Notes (Signed)
New Patient: SPINE    Referring Provider:  Mirna Mires, MD    CHIEF COMPLAINT:    Chief Complaint   Patient presents with   ??? Lower Back Pain     NP, LSP   ??? Leg Pain     Left thigh pain   ??? Hip Pain     Bilateral hip pain       HISTORY OF PRESENT ILLNESS:      ?? The patient is being sent at the request of Mirna Mires, MD in consultation as a new spine patient for low back and mid back pain. She also reports having bilateral hip pain and left thigh pain.  The patient is a 67 y.o. female whom reports symptoms for 1 month. Symptoms progressed over the last  2-3 weeks. Patient reports there was not a significant event to cause the symptoms.  Today discomfort is report at 8 out of 10, describing it as dull, aching. Symptoms are aggravated by: walking, standing, sitting, bending, driving. Patient has undergone recent treatment including, oral medications and x-ray of lumbar spine and thoracic spine.  She does report having CT Abdomen/Pelvis completed prior to x-rays as her PCP thought she may have kidney stones. Patient denies previous lumbar spine surgery.   ?? Patient presents today with mostly mid back and low back pain.  She does report having some bilateral hip pain and radiating left thigh pain that she notes does not fully extend into her knee.  She did not have a specific injury but states pain began mid September and saw her PCP.  She was originally worked up for possible kidney stones.  At that time her pain was mostly around her bra strap area and radiating downward.  She has since been put on 800mg  Ibuprofen, along with oral steroids and a muscle relaxer.  She does feel as though the medications have helped some but overall she does not get any long last relief.  She even reports alternating ibuprofen with tylenol.  She denies any other recent treatments such as physical therapy or chiropractor treatments.  She denies any recent injections.  She does report driving, sitting and bending to be her most  aggravating factors at this time.  Standing and walking are bothersome as well but do not appear to be as aggravating as other factors.        Pain Assessment  Location of Pain: Back(LSP)  Location Modifiers: Posterior, Left, Right  Severity of Pain: 8  Quality of Pain: Dull, Aching  Duration of Pain: Persistent  Frequency of Pain: Constant  Aggravating Factors: Other (Comment), Bending, Standing, Walking(Driving; Sitting)  Limiting Behavior: Yes  Relieving Factors: Rest, Nsaids  Result of Injury: No  Work-Related Injury: No  Are there other pain locations you wish to document?: No      Associated signs and symptoms:   Neurogenic bowel or bladder symptoms:  Yes frequency and incontinence    Perceived weakness:  no   Difficulty walking:  no    Recent Imaging (within past one year)   Xrays: yes   MRI or CT of spine: no    Current/Past Treatment:   ?? Physical Therapy:  none  ?? Chiropractic:  none  ?? Injection:  none  ?? Medications:   NSAIDS:  yes   Muscle relaxer:  yes   Steriods:  yes   Neuropathic medications:  none   Opioids:  none  ?? Previous surgery:  no  ?? Previous surgical consult:  no  ??  Other:  ?? Infection control  ?? Tested positive for MRSA in past 12 months:  no  ?? Tested positive for MSSA "staph infection" in past 12 months: no  ?? Tested positive for VRE (Vancomycin Resistant Enterococci) in past 12 months:   no  ?? Currently on any antibiotics for an infection: no  ?? Anticoagulants:  ?? On a blood thinner:  no   ?? Any history of bleeding disorder: no   ?? MRI Contraindication: no   ?? Previous Pain Management: no                   Past Medical History:   Past Medical History:   Diagnosis Date   ??? Anxiety    ??? PONV (postoperative nausea and vomiting)       Past Surgical History:     Past Surgical History:   Procedure Laterality Date   ??? APPENDECTOMY     ??? CHOLECYSTECTOMY  06/18/2014    Laparoscopic   ??? FOOT SURGERY Left     x 2   ??? HERNIA REPAIR     ??? KNEE SURGERY Left    ??? SHOULDER SURGERY Right    ??? TUBAL  LIGATION       Current Medications:     Current Outpatient Medications:   ???  tiZANidine (ZANAFLEX) 4 MG tablet, Take 1 tablet by mouth 3 times daily, Disp: 30 tablet, Rfl: 0  ???  ibuprofen (ADVIL;MOTRIN) 800 MG tablet, Take 1 tablet by mouth 3 times daily (with meals), Disp: 90 tablet, Rfl: 0  ???  fluticasone (FLONASE) 50 MCG/ACT nasal spray, 1 spray by Nasal route daily, Disp: 1 Bottle, Rfl: 5  ???  albuterol sulfate HFA (PROAIR HFA) 108 (90 Base) MCG/ACT inhaler, INHALE 2 PUFFS INTO THE LUNGS EVERY 6 HOURS AS NEEDED FOR WHEEZING, Disp: 8.5 g, Rfl: 5  ???  PARoxetine (PAXIL) 40 MG tablet, TAKE 1 TABLET BY MOUTH DAILY, Disp: 30 tablet, Rfl: 5  ???  omeprazole (PRILOSEC) 40 MG delayed release capsule, TAKE 1 CAPSULE BY MOUTH DAILY, Disp: 30 capsule, Rfl: 5  ???  rosuvastatin (CRESTOR) 5 MG tablet, Take 1 tablet by mouth three times a week, Disp: 12 tablet, Rfl: 3  Allergies:  Patient has no known allergies.  Social History:    reports that she has been smoking cigarettes. She started smoking about 53 years ago. She has a 53.00 pack-year smoking history. She has never used smokeless tobacco. She reports that she drinks alcohol. She reports that she does not use drugs.  Family History:   Family History   Problem Relation Age of Onset   ??? Cancer Mother         colon   ??? Cancer Sister         colon   ??? Cancer Brother         colon   ??? Cancer Brother         bladder, lung, throat   ??? Cancer Brother         prostate   ??? Cancer Brother         skin         REVIEW OF SYSTEMS: Full ROS reviewed & scanned from 10/18/2018           PHYSICAL EXAM:    Vitals: Blood pressure 129/76, height 5' 4.96" (1.65 m), weight 194 lb 0.1 oz (88 kg).    GENERAL EXAM:  ?? General Apparence: Patient is adequately groomed with no  evidence of malnutrition.  ?? Psychiatric: Orientation: The patient is oriented to time, place and person. The patient's mood and affect are appropriate   ?? Vascular: Examination reveals no swelling and palpation reveals no  tenderness in upper or lower extremities. Good capillary refill.   ?? The lymphatic examination of the neck, axillae and groin reveals all areas to be without enlargement or induration   Sensation is intact without deficit in the upper and lower extremities to light touch and pinprick  ?? Coordination of the upper and lower extremities are normal.    CERVICAL EXAMINATION:  ?? Inspection: Local inspection shows no step-off or bruising.  Cervical alignment is normal. No instability is noted.  ?? Palpation and Percussion: No evidence of tenderness at the midline.  Paraspinal tenderness is not present. There is no paraspinal spasm.  ?? Range of Motion:  pain-free ROM   ?? Strength: 5/5 bilateral upper extremities  ?? Special Tests:   Spurling's and Hoffman's are negative bilaterally.    Hawkins and Impingement tests are negative bilaterally.  ?? Skin:There are no rashes, ulcerations or lesions.  ?? Reflexes: Bilaterally triceps, biceps and brachioradialis are 1+.  Clonus absent bilaterally at the feet. No pathological reflexes are noted.  ?? Gait & station: normal, patient ambulates without assistance  ?? Additional Examinations:  ?? RIGHT UPPER EXTREMITY:  Inspection/examination of the right upper extremity does not show any tenderness, deformity or injury. Range of motion is normal and pain-free. There is no gross instability.  There are no rashes, ulcerations or lesions. Strength and tone are normal. No atrophy or abnormal movements are noted.  ?? LEFT UPPER EXTREMITY: Inspection/examination of the left upper extremity does not show any tenderness, deformity or injury. Range of motion is normal and pain-free. There is no gross instability.  There are no rashes, ulcerations or lesions. Strength and tone are normal. No atrophy or abnormal movements are noted.    LUMBAR/SACRAL EXAMINATION:  ?? Inspection: Local inspection shows no step-off or bruising.  Lumbar alignment is normal. No instability is noted.  ?? Palpation:   No evidence  of tenderness at the midline. Lumbar paraspinal tenderness Mild L4/5 and L5/S1 tenderness  Bursal tenderness No tenderness bilaterally  There is no paraspinal spasm.  ?? Range of Motion: limited by 25% in all planes due to pain  ?? Strength:   Strength testing is 5/5 in all muscle groups tested.  ?? Special Tests:   Straight leg raise and crossed SLR negative. Patrick's testing is negative bilaterally. FADIR's testing is negative bilaterally.  ?? Skin: There are no rashes, ulcerations or lesions.  ?? Reflexes: Reflexes are symmetrically 2+ at the patellar and ankle tendons.  Clonus absent bilaterally at the feet.  ?? Gait & station: normal, patient ambulates without assistance  ?? Additional Examinations:  ?? RIGHT LOWER EXTREMITY: Inspection/examination of the right lower extremity does not show any tenderness, deformity or injury. Range of motion is unremarkable. There is no gross instability.  There are no rashes, ulcerations or lesions. Strength and tone are normal. No atrophy or abnormal movements are noted.  ?? LEFT LOWER EXTREMITY:  Inspection/examination of the left lower extremity does not show any tenderness, deformity or injury. Range of motion is unremarkable. There is no gross instability. There are no rashes, ulcerations or lesions.  Strength and tone are normal. No atrophy or abnormal movements are noted.      Diagnostic Testing:    Xrays:   X-ray of the Thoracic Spine from 10/03/18:  FINDINGS:   On the lateral view, a smooth kyphotic curvature is present. ??Mild narrowing   ??   of the thoracic disc spaces is present with slight degenerative endplate   bony   spurring. ??The paraspinal soft tissues appear normal. ??No fracture or   destructive bony lesion. ??Posterior ribs appear normal.   ??   IMPRESSION:   Mild thoracic spondylosis. ??No acute abnormality.     X-ray of the Lumbar Spine from 10/03/18:  FINDINGS:   On the neutral lateral view, a smooth lordotic curve is seen. ??No fracture   or   destructive bony  lesion present. ??Moderate or severe narrowing of the upper   lumbar interspaces is present. ??Vacuum disc present at L1-L2 with large   anterior bony spurs. ??With flexion and extension, no abnormal movement of   any   lumbar segment seen. ??Moderate to severe facet arthropathy present in the   lower lumbar spine.   ??   IMPRESSION:   Moderate lumbar spondylosis of the upper lumbar spine. ??Severe facet   arthropathy. ??No evident instability. ??No acute abnormality.     MRI or CT:  None  EMG:  None  Results for orders placed or performed in visit on 09/07/18   URINE CULTURE   Result Value Ref Range    Urine Culture, Routine       <50,000 CFU/ml mixed skin/urogenital flora. No further workup   Lipid Panel   Result Value Ref Range    Cholesterol, Total 225 (H) 0 - 199 mg/dL    Triglycerides 829 (H) 0 - 150 mg/dL    HDL 39 (L) 40 - 60 mg/dL    LDL Calculated 562 (H) <100 mg/dL    VLDL Cholesterol Calculated 44 Not Established mg/dL   Hemoglobin Z3Y   Result Value Ref Range    Hemoglobin A1C 6.6 See comment %    eAG 142.7 mg/dL   Comprehensive Metabolic Panel   Result Value Ref Range    Sodium 141 136 - 145 mmol/L    Potassium 5.0 3.5 - 5.1 mmol/L    Chloride 100 99 - 110 mmol/L    CO2 25 21 - 32 mmol/L    Anion Gap 16 3 - 16    Glucose 143 (H) 70 - 99 mg/dL    BUN 7 7 - 20 mg/dL    CREATININE 0.6 0.6 - 1.2 mg/dL    GFR Non-African American >60 >60    GFR African American >60 >60    Calcium 10.1 8.3 - 10.6 mg/dL    Total Protein 7.6 6.4 - 8.2 g/dL    Alb 5.0 3.4 - 5.0 g/dL    Albumin/Globulin Ratio 1.9 1.1 - 2.2    Total Bilirubin 0.8 0.0 - 1.0 mg/dL    Alkaline Phosphatase 102 40 - 129 U/L    ALT 55 (H) 10 - 40 U/L    AST 35 15 - 37 U/L    Globulin 2.6 g/dL   Hepatitis C Antibody   Result Value Ref Range    Hep C Ab Interp Non-reactive Non-reactive   CBC Auto Differential   Result Value Ref Range    WBC 9.6 4.0 - 11.0 K/uL    RBC 4.98 4.00 - 5.20 M/uL    Hemoglobin 15.9 12.0 - 16.0 g/dL    Hematocrit 86.5 78.4 - 48.0 %     MCV 91.5 80.0 - 100.0 fL    MCH 31.9 26.0 - 34.0 pg    MCHC 34.9  31.0 - 36.0 g/dL    RDW 09.8 11.9 - 14.7 %    Platelets 294 135 - 450 K/uL    MPV 8.6 5.0 - 10.5 fL    Neutrophils % 58.9 %    Lymphocytes % 33.3 %    Monocytes % 4.7 %    Eosinophils % 2.4 %    Basophils % 0.7 %    Neutrophils Absolute 5.7 1.7 - 7.7 K/uL    Lymphocytes Absolute 3.2 1.0 - 5.1 K/uL    Monocytes Absolute 0.5 0.0 - 1.3 K/uL    Eosinophils Absolute 0.2 0.0 - 0.6 K/uL    Basophils Absolute 0.1 0.0 - 0.2 K/uL   POCT Urinalysis no Micro   Result Value Ref Range    Color, UA Yellow     Clarity, UA Clear     Glucose, UA POC Negative     Bilirubin, UA Negative     Ketones, UA Negative     Spec Grav, UA 1.015     Blood, UA POC Trace-intact     pH, UA 6.0     Protein, UA POC Negative     Urobilinogen, UA 0.2     Leukocytes, UA Trace     Nitrite, UA Negative        Impression (Medical Decision Making):       1. Spondylosis without myelopathy or radiculopathy, lumbar region    2. DDD (degenerative disc disease), lumbar        Plan (Medical Decision Making):    I discussed the diagnosis and the treatment options with Lisa York today.     In Summary:  The various treatment options were outlined and discussed with Lisa York including:  Conservative care options: physical therapy, ice, medications, bracing, and activity modification. The indications for therapeutic injections. The indications for additional imaging/laboratory studies.  The indications for (possible future) interventions.     After considering the various options discussed, Lisa York elected to pursue a course of treatment that includes the following:      1. Medications: No further recommendations for new medications.    2. PT:  I will start the patient on a trial of PT to work on a lumbar stabilization program to focus on core strengthening, core stabilizing, lumbar stretches, hamstring flexibility, modalities as indicated for 6-8 visits over the next 4-6 weeks.    3. Further  studies:  Setup Lumbar MR without contrast to evaluate for soft tissue pathology or stenosis contributing to the back pain and paresthesia.  The patient has failed a six week trial of a HEP program within the last 6 months.    4. Interventional:  After further imaging is obtained, interventional options will be reviewed and recommended.    5. Healthy Lifestyle Measures:  Patient education material reviewing the following was distributed to Lisa York  Anatomic drawings  Healthy lifestyle education  Osteoporosis prevention,   Back and neck pain educational information   Advanced imaging preparedness    Posture education   Proper lifting and carrying techniques,   Weight management  Quitting smoking and   Minor ways to treat back pain  For further information regarding the spine conditions and to review interventional treatments the patient was directed to [email or web address].    6.  Follow up:  1-2 weeks    Lisa York was instructed to call the office if her symptoms worsen or if new symptoms appear prior to the next scheduled visit. She  is specifically instructed to contact the office between now & her scheduled appointment if she has concerns related to her condition or if she needs assistance in scheduling the above tests. She is welcome to call for an appointment sooner if she has any additional concerns or questions.       I, Stefanie I. Chambers, ATC, am scribing for and in the presence of Armen Pickup. Kaylia Winborne, PA.  10/18/18 11:00 AM Stefanie I. Chambers, ATC.      The physical examination was performed between the patient and Armen Pickup. Miosotis Wetsel, PA.  All counseling during the appointment was performed between the patient and the provider.      Lauralyn Primes. Britzy Graul, PA-C, personally performed the services described in this documentation as scribed by Stefanie I. Chambers, ATC in my presence and it is both accurate and complete.              Babs Bertin, MSBS, PA-C  Board Certified by the [employer] on Certification of Physician Assistants  [facility]  Partner of [facility]       This dictation was performed with a verbal recognition program [facility]) and it was checked for errors. It is possible that there are still dictated errors within this office note. If so, please bring any errors to my attention for an addendum. All efforts were made to ensure that this office note is accurate.

## 2018-10-18 NOTE — Addendum Note (Signed)
Addended by: Leda Gauze on: 10/18/2018 11:10 AM     Modules accepted: Orders

## 2018-10-18 NOTE — Addendum Note (Signed)
Addended by: Manus Gunning on: 10/18/2018 11:09 AM     Modules accepted: Orders

## 2018-11-01 ENCOUNTER — Ambulatory Visit: Admit: 2018-11-01 | Discharge: 2018-11-01 | Payer: MEDICARE | Attending: Physician Assistant | Primary: Family Medicine

## 2018-11-01 DIAGNOSIS — M47816 Spondylosis without myelopathy or radiculopathy, lumbar region: Secondary | ICD-10-CM

## 2018-11-01 NOTE — Progress Notes (Signed)
Follow up: SPINE    Lisa York  Jul 25, 1951  [id number]      CHIEF COMPLAINT:    Chief Complaint   Patient presents with   ??? Lower Back Pain     TR MRI LSP.          HISTORY OF PRESENT ILLNESS:  Lisa York is a 67 y.o. female returns for a follow up visit for multiple medical problems.  Her current presenting problems are   1. Spondylosis without myelopathy or radiculopathy, lumbar region    2. DDD (degenerative disc disease), lumbar    3. Lumbar radiculopathy    .    As per information/history obtained from the PADT(patient assessment and documentation tool) - She complains of pain in the lower back with radiation to the mid back, hips Bilateral and upper leg Left She rates the pain 3/10 and describes it as dull, aching.  Pain is made worse by: walking, standing, sitting, bending, driving.   She denies side effects from the current pain regimen. Patient reports that since the last follow up visit the physical functioning is unchanged, family/social relationships are unchanged, mood is unchanged and sleep patterns are unchanged, and that the overall functioning is unchanged.  Patient denies neurological bowel or bladder.     Patient returns today for follow up evaluation and review of their lumbar spine MRI images. Patient describes that her pain intensity has decreased since her last visit on 10/18/2018, however the frequency has remained the same. The patient describes that she is improving however and does not wish to have any interventions at this time. The patient denies any new injuries or triggers at this time. The patient is not currently ambulating with any assistive devices in the office today.     Associated signs and symptoms:   Neurogenic bowel or bladder symptoms:  yes   Perceived weakness:  no   Difficulty walking:  no              Past Medical History:   Past Medical History:   Diagnosis Date   ??? Anxiety    ??? PONV (postoperative nausea and vomiting)       Past Surgical History:     Past Surgical History:    Procedure Laterality Date   ??? APPENDECTOMY     ??? CHOLECYSTECTOMY  06/18/2014    Laparoscopic   ??? FOOT SURGERY Left     x 2   ??? HERNIA REPAIR     ??? KNEE SURGERY Left    ??? SHOULDER SURGERY Right    ??? TUBAL LIGATION       Current Medications:     Current Outpatient Medications:   ???  tiZANidine (ZANAFLEX) 4 MG tablet, Take 1 tablet by mouth every evening, Disp: 30 tablet, Rfl: 0  ???  tiZANidine (ZANAFLEX) 4 MG tablet, Take 1 tablet by mouth 3 times daily, Disp: 30 tablet, Rfl: 0  ???  ibuprofen (ADVIL;MOTRIN) 800 MG tablet, Take 1 tablet by mouth 3 times daily (with meals), Disp: 90 tablet, Rfl: 0  ???  fluticasone (FLONASE) 50 MCG/ACT nasal spray, 1 spray by Nasal route daily, Disp: 1 Bottle, Rfl: 5  ???  albuterol sulfate HFA (PROAIR HFA) 108 (90 Base) MCG/ACT inhaler, INHALE 2 PUFFS INTO THE LUNGS EVERY 6 HOURS AS NEEDED FOR WHEEZING, Disp: 8.5 g, Rfl: 5  ???  PARoxetine (PAXIL) 40 MG tablet, TAKE 1 TABLET BY MOUTH DAILY, Disp: 30 tablet, Rfl: 5  ???  omeprazole (PRILOSEC) 40  MG delayed release capsule, TAKE 1 CAPSULE BY MOUTH DAILY, Disp: 30 capsule, Rfl: 5  ???  rosuvastatin (CRESTOR) 5 MG tablet, Take 1 tablet by mouth three times a week, Disp: 12 tablet, Rfl: 3  Allergies:  Patient has no known allergies.  Social History:    reports that she has been smoking cigarettes. She started smoking about 53 years ago. She has a 53.00 pack-year smoking history. She has never used smokeless tobacco. She reports that she drinks alcohol. She reports that she does not use drugs.  Family History:   Family History   Problem Relation Age of Onset   ??? Cancer Mother         colon   ??? Cancer Sister         colon   ??? Cancer Brother         colon   ??? Cancer Brother         bladder, lung, throat   ??? Cancer Brother         prostate   ??? Cancer Brother         skin       REVIEW OF SYSTEMS:   CONSTITUTIONAL: Denies unexplained weight loss, fevers, chills or fatigue  NEUROLOGICAL: Denies unsteady gait or progressive weakness  MUSCULOSKELETAL: Denies  joint swelling or redness  GI: Denies nausea, vomiting, diarrhea   GU: Denies bowel or bladder issues       PHYSICAL EXAM:    Vitals: Blood pressure 132/84, pulse 86, height 5' 4.96" (1.65 m), weight 194 lb 0.1 oz (88 kg).    GENERAL EXAM:  ?? General Apparence: Patient is adequately groomed with no evidence of malnutrition.  ?? Psychiatric: Orientation: The patient is oriented to time, place and person. The patient'York mood and affect are appropriate   ?? Vascular: Examination reveals no swelling and palpation reveals no tenderness in upper or lower extremities. Good capillary refill.   ?? The lymphatic examination of the neck, axillae and groin reveals all areas to be without enlargement or induration  ?? Sensation is intact without deficit in the upper and lower extremities to light touch and pinprick  ?? Coordination of the upper and lower extremities are normal.  ?? RIGHT UPPER EXTREMITY:  Inspection/examination of the right upper extremity does not show any tenderness, deformity or injury. Range of motion is unremarkable and pain-free. There is no gross instability.  There are no rashes, ulcerations or lesions. Strength and tone are normal. No atrophy or abnormal movements are noted.  ?? LEFT UPPER EXTREMITY: Inspection/examination of the left upper extremity does not show any tenderness, deformity or injury. Range of motion is unremarkable and pain-free. There is no gross instability.  There are no rashes, ulcerations or lesions. Strength and tone are normal. No atrophy or abnormal movements are noted.    LUMBAR/SACRAL EXAMINATION:  ?? Inspection: Local inspection shows no step-off or bruising.  Lumbar alignment is normal. No instability is noted.  ?? Palpation:   No evidence of tenderness at the midline.  Lumbar paraspinal tenderness: No tenderness to palpation of the lumbar paraspinals  Bursal tenderness No tenderness bilaterally  There is no paraspinal spasm.  ?? Range of Motion: pain-free ROM  ?? Strength:   Strength  testing is 5/5 in all muscle groups tested.  ?? Special Tests:   Straight leg raise and crossed SLR negative. Patrick'York testing is negative bilaterally. FADIR'York testing is negative bilaterally.  ?? Skin: There are no rashes, ulcerations or lesions.  ??  Reflexes: Reflexes are symmetrically 1+ at the patellar and ankle tendons.  Clonus absent bilaterally at the feet.  ?? Gait & station: normal, patient ambulates without assistance  ?? Additional Examinations:  ?? RIGHT LOWER EXTREMITY: Inspection/examination of the right lower extremity does not show any tenderness, deformity or injury. Range of motion is normal and pain-free. There is no gross instability.  There are no rashes, ulcerations or lesions. Strength and tone are normal. No atrophy or abnormal movements are noted.  ?? LEFT LOWER EXTREMITY:  Inspection/examination of the left lower extremity does not show any tenderness, deformity or injury. Range of motion is normal and pain-free. There is no gross instability. There are no rashes, ulcerations or lesions.  Strength and tone are normal. No atrophy or abnormal movements are noted.      Diagnostic Testing:    MR Lumbar spine shows from 10/19/18:  FINDINGS:   BONES/ALIGNMENT: 1-2 mm retrolisthesis at L1-L2. ??The lumbar vertebral   bodies   are normal in height. ??Marrow signal is unremarkable. ??No acute or chronic   compression deformity.   ??   SPINAL CORD: The conus terminates normally.   ??   SOFT TISSUES: No paraspinal mass identified.   ??   L1-L2: Small left foraminal disc protrusion. ??No significant neural   foraminal   narrowing. ??Insert   ??   L2-L3: There is no significant disc herniation, spinal canal stenosis or   neural foraminal narrowing.   ??   L3-L4: There is no significant disc herniation, spinal canal stenosis or   neural foraminal narrowing.   ??   L4-L5: Bilateral facet hypertrophy. ??Mild diffuse disc bulging. ??Mild left   and mild-moderate right neural foraminal narrowing. ??Lateral recess   effacement  is present. ??There is mild spinal canal stenosis.   ??   Legally authenticated by Dewaine Oats 2018-10-19 14:13:09   ??   ??   ??   Patient Name: Lisa York, Lisa York   L5-S1: Bilateral facet hypertrophy. ??Diffuse disc bulging and endplate   spurring. ??Annular fissure. ??Moderate bilateral neural foraminal narrowing.   Mild lateral recess effacement. ??No significant spinal canal stenosis.   ??   IMPRESSION:   Mild-moderate neural foraminal narrowing. ??No significant spinal canal   stenosis.     Results for orders placed or performed in visit on 09/07/18   URINE CULTURE   Result Value Ref Range    Urine Culture, Routine       <50,000 CFU/ml mixed skin/urogenital flora. No further workup   Lipid Panel   Result Value Ref Range    Cholesterol, Total 225 (H) 0 - 199 mg/dL    Triglycerides 562 (H) 0 - 150 mg/dL    HDL 39 (L) 40 - 60 mg/dL    LDL Calculated 130 (H) <100 mg/dL    VLDL Cholesterol Calculated 44 Not Established mg/dL   Hemoglobin Q6V   Result Value Ref Range    Hemoglobin A1C 6.6 See comment %    eAG 142.7 mg/dL   Comprehensive Metabolic Panel   Result Value Ref Range    Sodium 141 136 - 145 mmol/L    Potassium 5.0 3.5 - 5.1 mmol/L    Chloride 100 99 - 110 mmol/L    CO2 25 21 - 32 mmol/L    Anion Gap 16 3 - 16    Glucose 143 (H) 70 - 99 mg/dL    BUN 7 7 - 20 mg/dL    CREATININE 0.6 0.6 - 1.2 mg/dL  GFR Non-African American >60 >60    GFR African American >60 >60    Calcium 10.1 8.3 - 10.6 mg/dL    Total Protein 7.6 6.4 - 8.2 g/dL    Alb 5.0 3.4 - 5.0 g/dL    Albumin/Globulin Ratio 1.9 1.1 - 2.2    Total Bilirubin 0.8 0.0 - 1.0 mg/dL    Alkaline Phosphatase 102 40 - 129 U/L    ALT 55 (H) 10 - 40 U/L    AST 35 15 - 37 U/L    Globulin 2.6 g/dL   Hepatitis C Antibody   Result Value Ref Range    Hep C Ab Interp Non-reactive Non-reactive   CBC Auto Differential   Result Value Ref Range    WBC 9.6 4.0 - 11.0 K/uL    RBC 4.98 4.00 - 5.20 M/uL    Hemoglobin 15.9 12.0 - 16.0 g/dL    Hematocrit 16.1 09.6 - 48.0 %    MCV 91.5  80.0 - 100.0 fL    MCH 31.9 26.0 - 34.0 pg    MCHC 34.9 31.0 - 36.0 g/dL    RDW 04.5 40.9 - 81.1 %    Platelets 294 135 - 450 K/uL    MPV 8.6 5.0 - 10.5 fL    Neutrophils % 58.9 %    Lymphocytes % 33.3 %    Monocytes % 4.7 %    Eosinophils % 2.4 %    Basophils % 0.7 %    Neutrophils Absolute 5.7 1.7 - 7.7 K/uL    Lymphocytes Absolute 3.2 1.0 - 5.1 K/uL    Monocytes Absolute 0.5 0.0 - 1.3 K/uL    Eosinophils Absolute 0.2 0.0 - 0.6 K/uL    Basophils Absolute 0.1 0.0 - 0.2 K/uL   POCT Urinalysis no Micro   Result Value Ref Range    Color, UA Yellow     Clarity, UA Clear     Glucose, UA POC Negative     Bilirubin, UA Negative     Ketones, UA Negative     Spec Grav, UA 1.015     Blood, UA POC Trace-intact     pH, UA 6.0     Protein, UA POC Negative     Urobilinogen, UA 0.2     Leukocytes, UA Trace     Nitrite, UA Negative      Impression:       1. Spondylosis without myelopathy or radiculopathy, lumbar region    2. DDD (degenerative disc disease), lumbar    3. Lumbar radiculopathy        Plan:  Clinical Course: Above diagnoses are improving     I discussed the diagnosis and the treatment options with Lisa York today.     In Summary:  The various treatment options were outlined and discussed with Lisa York including:  Conservative care options: physical therapy, ice, medications, bracing, and activity modification. The indications for therapeutic injections. The indications for additional imaging/laboratory studies.  The indications for (possible future) interventions.     After considering the various options discussed, Lisa York elected to pursue a course of treatment that includes the following:    1. Medications:  No further recommendations for new medications.    2. PT:  Encouraged to continue with HEP.    3. Further studies:  No further studies.    4. Interventional:  The patient was provided information on medial branch blocks today in the office.  The patient does not wish  to move forward with any type of  injections at this time.  If the pain worsens, she will consider the blocks.     5. Follow up:  As needed.       Lisa York was instructed to call the office if her symptoms worsen or if new symptoms appear prior to the next scheduled visit. She is specifically instructed to contact the office between now & her scheduled appointment if she has concerns related to her condition or if she needs assistance in scheduling the above tests. She is welcome to call for an appointment sooner if she has any additional concerns or questions.       Lisa York, ATC, am scribing for and in the presence of Lisa Clifton Kovacic, PA-C.    11/01/18 10:09 AM Sharen Counter, ATC    The physical examination was performed between the patient and Babs Bertin, PA-C All counseling during the appointment was performed between the patient and provider.    Lisa Maw, PA-C, personally performed the services described in this documentation as scribed by Sharen Counter, AT in my presence and it is both accurate and complete.          Babs Bertin, MSBS, PA-C  Board Certified by the [employer] on Certification of Physician Assistants  [facility]  Partner of [facility]         This dictation was performed with a verbal recognition program [facility]) and it was checked for errors. It is possible that there are still dictated errors within this office note. If so, please bring any errors to my attention for an addendum. All efforts were made to ensure that this office note is accurate.

## 2018-11-03 NOTE — Progress Notes (Signed)
Results for the following test have been finalized and entered into the patients chart.

## 2018-11-08 ENCOUNTER — Encounter

## 2018-11-08 MED ORDER — IBUPROFEN 800 MG PO TABS
800 MG | ORAL_TABLET | Freq: Three times a day (TID) | ORAL | 0 refills | Status: DC
Start: 2018-11-08 — End: 2018-12-12

## 2018-11-08 NOTE — Telephone Encounter (Signed)
Patient requesting a medication refill.  Medication ibuprofen (ADVIL;MOTRIN) 800 MG tablet  [facility] [place] [phone] # [phone] - P [phone] [name]- F [phone]  Last office visit: 10/03/18  Next office visit: 02/14/2019

## 2018-11-14 ENCOUNTER — Encounter

## 2018-11-14 MED ORDER — PAROXETINE HCL 40 MG PO TABS
40 MG | ORAL_TABLET | ORAL | 5 refills | Status: AC
Start: 2018-11-14 — End: ?

## 2018-11-14 NOTE — Telephone Encounter (Signed)
Last office visit 10/03/2018, advised to follow-up none, next appointment 02/14/2019 AWV.

## 2018-12-12 ENCOUNTER — Encounter

## 2018-12-12 MED ORDER — IBUPROFEN 800 MG PO TABS
800 MG | ORAL_TABLET | Freq: Three times a day (TID) | ORAL | 1 refills | Status: DC
Start: 2018-12-12 — End: 2019-03-09

## 2018-12-12 NOTE — Telephone Encounter (Signed)
Last office visit 10/7  , advised to follow-up none, next appointment 2/17 AWV.

## 2019-01-26 ENCOUNTER — Ambulatory Visit: Admit: 2019-01-26 | Discharge: 2019-01-26 | Payer: MEDICARE | Attending: Family | Primary: Family Medicine

## 2019-01-26 DIAGNOSIS — M5137 Other intervertebral disc degeneration, lumbosacral region: Secondary | ICD-10-CM

## 2019-01-26 MED ORDER — METHYLPREDNISOLONE 4 MG PO TBPK
4 MG | PACK | ORAL | 0 refills | Status: DC
Start: 2019-01-26 — End: 2019-02-01

## 2019-01-26 NOTE — Progress Notes (Signed)
01/26/2019     Lisa York (DOB:  Jun 29, 1951) is a 68 y.o. female, here for evaluation of the following medical concerns:    She has been seeing [facility] for back pain, they got an MRI and diagnosed her with DDD and spondalosis. She was presented with multiple options and she has denied anything at this point. She states she called to make an appointment to follow up with back pain but they could not get her in until Feb 18-19 th and she is getting eye surgery at that time.     We discussed Pain management and when discussing this and their drug policy she states she smokes "weed daily" and there is "no point to go because she needs her weed". She states she would like pain medication in office today and I informed her I cannot do this at this time and we must go through proper channels but I did think steroids could help her pain. She agreed to try this first.     She will follow up with [facility] about her options for her pain. We also discussed massage therapy to help with her back pain.     Review of Systems   Constitutional: Positive for appetite change. Negative for activity change, chills, diaphoresis, fatigue, fever and unexpected weight change.   HENT: Negative for congestion, ear discharge, ear pain, hearing loss, nosebleeds, postnasal drip, rhinorrhea, sinus pressure, sinus pain, sneezing, sore throat, tinnitus, trouble swallowing and voice change.    Eyes: Negative for photophobia, pain, discharge, redness and itching.   Respiratory: Negative for cough, choking, chest tightness, shortness of breath, wheezing and stridor.    Cardiovascular: Negative for chest pain, palpitations and leg swelling.   Gastrointestinal: Negative for abdominal pain, blood in stool, constipation, diarrhea, nausea and vomiting.   Endocrine: Negative for cold intolerance, heat intolerance, polydipsia and polyuria.   Genitourinary: Negative for difficulty urinating, dysuria, enuresis, flank pain, frequency, hematuria and  urgency.   Musculoskeletal: Positive for arthralgias, back pain and myalgias. Negative for gait problem, joint swelling, neck pain and neck stiffness.   Skin: Negative for color change, pallor, rash and wound.   Allergic/Immunologic: Negative for environmental allergies and food allergies.   Neurological: Negative for dizziness, tremors, syncope, speech difficulty, weakness, light-headedness, numbness and headaches.   Hematological: Negative for adenopathy. Does not bruise/bleed easily.   Psychiatric/Behavioral: Negative for agitation, behavioral problems, confusion, decreased concentration, dysphoric mood, hallucinations, self-injury, sleep disturbance and suicidal ideas. The patient is not nervous/anxious and is not hyperactive.        Prior to Visit Medications    Medication Sig Taking? Authorizing Provider   methylPREDNISolone (MEDROL DOSEPACK) 4 MG tablet Take by mouth. Yes Dorise Hiss, APRN - CNP   ibuprofen (ADVIL;MOTRIN) 800 MG tablet TAKE 1 TABLET BY MOUTH 3 TIMES DAILY (WITH MEALS) Yes Dorise Hiss, APRN - CNP   PARoxetine (PAXIL) 40 MG tablet TAKE 1 TABLET BY MOUTH DAILY Yes Dorise Hiss, APRN - CNP   tiZANidine (ZANAFLEX) 4 MG tablet Take 1 tablet by mouth 3 times daily Yes Margaretmary Dys, MD   rosuvastatin (CRESTOR) 5 MG tablet Take 1 tablet by mouth three times a week Yes Margaretmary Dys, MD   fluticasone [facility]) 50 MCG/ACT nasal spray 1 spray by Nasal route daily Yes Margaretmary Dys, MD   albuterol sulfate HFA (PROAIR HFA) 108 (90 Base) MCG/ACT inhaler INHALE 2 PUFFS INTO THE LUNGS EVERY 6 HOURS AS NEEDED FOR WHEEZING Yes Margaretmary Dys, MD  omeprazole (PRILOSEC) 40 MG delayed release capsule TAKE 1 CAPSULE BY MOUTH DAILY Yes Dorise Hiss, APRN - CNP        Social History     Tobacco Use   ??? Smoking status: Current Every Day Smoker     Packs/day: 1.00     Years: 53.00     Pack years: 53.00     Types: Cigarettes     Start date: 02/07/1965   ??? Smokeless tobacco: Never Used   Substance Use  Topics   ??? Alcohol use: Yes        Vitals:    01/26/19 1349   BP: 134/80   Pulse: 87   SpO2: 97%   Weight: 184 lb (83.5 kg)     Estimated body mass index is 30.66 kg/m?? as calculated from the following:    Height as of 11/01/18: 5' 4.96" (1.65 m).    Weight as of this encounter: 184 lb (83.5 kg).    Physical Exam  Vitals signs reviewed.   Constitutional:       General: She is not in acute distress.     Appearance: Normal appearance. She is well-developed.   HENT:      Head: Normocephalic and atraumatic.      Right Ear: Hearing and external ear normal.      Left Ear: Hearing and external ear normal.      Nose: Nose normal.      Right Sinus: No maxillary sinus tenderness or frontal sinus tenderness.      Left Sinus: No maxillary sinus tenderness or frontal sinus tenderness.      Mouth/Throat:      Pharynx: No oropharyngeal exudate.   Eyes:      General:         Right eye: No discharge.         Left eye: No discharge.      Conjunctiva/sclera: Conjunctivae normal.      Pupils: Pupils are equal, round, and reactive to light.   Neck:      Musculoskeletal: Normal range of motion.      Thyroid: No thyromegaly.      Vascular: No JVD.      Trachea: No tracheal deviation.   Cardiovascular:      Rate and Rhythm: Normal rate and regular rhythm.      Heart sounds: Normal heart sounds. No murmur. No friction rub.   Pulmonary:      Effort: Pulmonary effort is normal.      Breath sounds: Normal breath sounds. No stridor. No decreased breath sounds.   Musculoskeletal: Normal range of motion.         General: Tenderness present.      Thoracic back: She exhibits tenderness and pain.        Back:       Comments: Denies numbness and tingling, denies incontinence   Lymphadenopathy:      Cervical: No cervical adenopathy.   Skin:     General: Skin is warm and dry.      Capillary Refill: Capillary refill takes less than 2 seconds.      Findings: No rash.   Neurological:      Mental Status: She is alert and oriented to person, place, and time.       Sensory: Sensation is intact.      Motor: Motor function is intact.      Coordination: Coordination normal.   Psychiatric:  Attention and Perception: Attention and perception normal.         Mood and Affect: Mood is anxious.         Speech: Speech is rapid and pressured.         Behavior: Behavior is hyperactive. Behavior is cooperative.         Thought Content: Thought content normal.         Cognition and Memory: Cognition normal.         Judgment: Judgment normal.      Comments: Constantly moving and changing positions in the office.        ASSESSMENT/PLAN:  1. DDD (degenerative disc disease), lumbosacral    - methylPREDNISolone (MEDROL DOSEPACK) 4 MG tablet; Take by mouth.  Dispense: 1 kit; Refill: 0  - External Referral To Massage Therapy    2. Acute bilateral low back pain without sciatica    - methylPREDNISolone (MEDROL DOSEPACK) 4 MG tablet; Take by mouth.  Dispense: 1 kit; Refill: 0  - External Referral To Massage Therapy    Return if symptoms worsen or fail to improve.    An electronic signature was used to authenticate this note.    --Dorise Hiss, APRN - CNP on 01/26/2019 at 2:20 PM

## 2019-02-01 ENCOUNTER — Ambulatory Visit: Admit: 2019-02-01 | Discharge: 2019-02-01 | Payer: MEDICARE | Attending: Physician Assistant | Primary: Family Medicine

## 2019-02-01 DIAGNOSIS — M47816 Spondylosis without myelopathy or radiculopathy, lumbar region: Secondary | ICD-10-CM

## 2019-02-01 MED ORDER — TRAMADOL HCL 50 MG PO TABS
50 MG | ORAL_TABLET | Freq: Two times a day (BID) | ORAL | 0 refills | Status: AC
Start: 2019-02-01 — End: 2019-02-08

## 2019-02-01 NOTE — Progress Notes (Signed)
Follow up: SPINE    Lisa FordyceLinda S York  February 05, 1951  [id number]         Chief Complaint   Patient presents with   ??? Lower Back Pain     F/u LSP.  LOV 11/01/18         HISTORY OF PRESENT ILLNESS:  [name] is a 68 y.o. female returns for a follow up visit for multiple medical problems.  Her current presenting problems are   1. Spondylosis without myelopathy or radiculopathy, lumbar region    2. DDD (degenerative disc disease), lumbar    .    As per information/history obtained from the PADT(patient assessment and documentation tool) - She complains of pain in the lower back with radiation to the hips Bilateral and upper leg Left She rates the pain 8/10 and describes it as sharp, dull, aching, throbbing.  Pain is made worse by: movement, walking, standing, bending, all activities.   She denies side effects from the current pain regimen.Patient reports that since the last follow up visit the physical functioning is worse, family/social relationships are worse, mood is worse and sleep patterns are worse, and that the overall functioning is worse.  Patient denies neurological bowel or bladder.       [name] presents today for follow up of her ongoing low back, hip and leg pain.  She was last seen on 11/01/18 and doing okay at that time, only reporting minor pain with certain activities.  She states gradually over the past few months that her pain has drastically worsened and now reports pain level to be 8/10.  She describes pain with any activity and denies relief no matter what she does.  She has not had any new injuries or traumas to her back since her last visit.  She has not undergone any recent treatment other than home remedies for her symptoms.        Associated signs and symptoms:   Neurogenic bowel or bladder symptoms:  yes   Perceived weakness:  no   Difficulty walking:  yes              Past Medical History:   Past Medical History:   Diagnosis Date   ??? Anxiety    ??? PONV (postoperative nausea and vomiting)       Past  Surgical History:     Past Surgical History:   Procedure Laterality Date   ??? APPENDECTOMY     ??? CHOLECYSTECTOMY  06/18/2014    Laparoscopic   ??? FOOT SURGERY Left     x 2   ??? HERNIA REPAIR     ??? KNEE SURGERY Left    ??? SHOULDER SURGERY Right    ??? TUBAL LIGATION       Current Medications:     Current Outpatient Medications:   ???  traMADol (ULTRAM) 50 MG tablet, Take 1 tablet by mouth 2 times daily for 7 days., Disp: 14 tablet, Rfl: 0  ???  ibuprofen (ADVIL;MOTRIN) 800 MG tablet, TAKE 1 TABLET BY MOUTH 3 TIMES DAILY (WITH MEALS), Disp: 90 tablet, Rfl: 1  ???  PARoxetine (PAXIL) 40 MG tablet, TAKE 1 TABLET BY MOUTH DAILY, Disp: 30 tablet, Rfl: 5  ???  tiZANidine (ZANAFLEX) 4 MG tablet, Take 1 tablet by mouth 3 times daily, Disp: 30 tablet, Rfl: 0  ???  fluticasone (FLONASE) 50 MCG/ACT nasal spray, 1 spray by Nasal route daily, Disp: 1 Bottle, Rfl: 5  ???  albuterol sulfate HFA (PROAIR HFA) 108 (  90 Base) MCG/ACT inhaler, INHALE 2 PUFFS INTO THE LUNGS EVERY 6 HOURS AS NEEDED FOR WHEEZING, Disp: 8.5 g, Rfl: 5  ???  omeprazole (PRILOSEC) 40 MG delayed release capsule, TAKE 1 CAPSULE BY MOUTH DAILY, Disp: 30 capsule, Rfl: 5  ???  rosuvastatin (CRESTOR) 5 MG tablet, Take 1 tablet by mouth three times a week, Disp: 12 tablet, Rfl: 3  Allergies:  Patient has no known allergies.  Social History:    reports that she has been smoking cigarettes. She started smoking about 54 years ago. She has a 53.00 pack-year smoking history. She has never used smokeless tobacco. She reports current alcohol use. She reports that she does not use drugs.  Family History:   Family History   Problem Relation Age of Onset   ??? Cancer Mother         colon   ??? Cancer Sister         colon   ??? Cancer Brother         colon   ??? Cancer Brother         bladder, lung, throat   ??? Cancer Brother         prostate   ??? Cancer Brother         skin       REVIEW OF SYSTEMS:   CONSTITUTIONAL: Denies unexplained weight loss, fevers, chills or fatigue  NEUROLOGICAL: Denies unsteady gait  or progressive weakness  MUSCULOSKELETAL: Denies joint swelling or redness  GI: Denies nausea, vomiting, diarrhea   GU: Denies bowel or bladder issues       PHYSICAL EXAM:    Vitals: Blood pressure 132/84, height 5' 4.96" (1.65 m), weight 184 lb 1.4 oz (83.5 kg).    GENERAL EXAM:  ?? General Apparence: Patient is adequately groomed with no evidence of malnutrition.  ?? Psychiatric: Orientation: The patient is oriented to time, place and person. The patient's mood and affect are appropriate   ?? Vascular: Examination reveals no swelling and palpation reveals no tenderness in upper or lower extremities. Good capillary refill.   ?? The lymphatic examination of the neck, axillae and groin reveals all areas to be without enlargement or induration  ?? Sensation is intact without deficit in the upper and lower extremities to light touch and pinprick  ?? Coordination of the upper and lower extremities are normal.  ?? RIGHT UPPER EXTREMITY:  Inspection/examination of the right upper extremity does not show any tenderness, deformity or injury. Range of motion is unremarkable and pain-free. There is no gross instability.  There are no rashes, ulcerations or lesions. Strength and tone are normal. No atrophy or abnormal movements are noted.  ?? LEFT UPPER EXTREMITY: Inspection/examination of the left upper extremity does not show any tenderness, deformity or injury. Range of motion is unremarkable and pain-free. There is no gross instability.  There are no rashes, ulcerations or lesions. Strength and tone are normal. No atrophy or abnormal movements are noted.    LUMBAR/SACRAL EXAMINATION:  ?? Inspection: Local inspection shows no step-off or bruising.  Lumbar alignment is normal. No instability is noted.  ?? Palpation:   No evidence of tenderness at the midline.  Lumbar paraspinal tenderness: Mild L4/5 and L5/S1 tenderness  Bursal tenderness No tenderness bilaterally  There is no paraspinal spasm.  ?? Range of Motion: limited by 50% in  all planes due to pain specifically with extension and facet loading bilaterally.  ?? Strength:   Strength testing is 5/5 in all muscle groups  tested.  ?? Special Tests:   Straight leg raise and crossed SLR negative.Patrick's testing is negative bilaterally. FADIR's testing is negative bilaterally. Bowstring test negative. Slump test negative.   ?? Skin: There are no rashes, ulcerations or lesions.  ?? Reflexes: Reflexes are symmetrically 2+ at the patellar and ankle tendons.  Clonus absent bilaterally at the feet.  ?? Gait & station: normal, patient ambulates without assistance and no ataxia  ?? Additional Examinations:  ?? RIGHT LOWER EXTREMITY: Inspection/examination of the right lower extremity does not show any tenderness, deformity or injury. Range of motion is normal and pain-free. There is no gross instability.  There are no rashes, ulcerations or lesions. Strength and tone are normal. No atrophy or abnormal movements are noted.  ?? LEFT LOWER EXTREMITY:  Inspection/examination of the left lower extremity does not show any tenderness, deformity or injury. Range of motion is normal and pain-free. There is no gross instability. There are no rashes, ulcerations or lesions.  Strength and tone are normal. No atrophy or abnormal movements are noted.      Diagnostic Testing:    No new diagnostics  Results for orders placed or performed in visit on 09/07/18   URINE CULTURE   Result Value Ref Range    Urine Culture, Routine       <50,000 CFU/ml mixed skin/urogenital flora. No further workup   Lipid Panel   Result Value Ref Range    Cholesterol, Total 225 (H) 0 - 199 mg/dL    Triglycerides 972 (H) 0 - 150 mg/dL    HDL 39 (L) 40 - 60 mg/dL    LDL Calculated 820 (H) <100 mg/dL    VLDL Cholesterol Calculated 44 Not Established mg/dL   Hemoglobin U0R   Result Value Ref Range    Hemoglobin A1C 6.6 See comment %    eAG 142.7 mg/dL   Comprehensive Metabolic Panel   Result Value Ref Range    Sodium 141 136 - 145 mmol/L    Potassium  5.0 3.5 - 5.1 mmol/L    Chloride 100 99 - 110 mmol/L    CO2 25 21 - 32 mmol/L    Anion Gap 16 3 - 16    Glucose 143 (H) 70 - 99 mg/dL    BUN 7 7 - 20 mg/dL    CREATININE 0.6 0.6 - 1.2 mg/dL    GFR Non-African American >60 >60    GFR African American >60 >60    Calcium 10.1 8.3 - 10.6 mg/dL    Total Protein 7.6 6.4 - 8.2 g/dL    Alb 5.0 3.4 - 5.0 g/dL    Albumin/Globulin Ratio 1.9 1.1 - 2.2    Total Bilirubin 0.8 0.0 - 1.0 mg/dL    Alkaline Phosphatase 102 40 - 129 U/L    ALT 55 (H) 10 - 40 U/L    AST 35 15 - 37 U/L    Globulin 2.6 g/dL   Hepatitis C Antibody   Result Value Ref Range    Hep C Ab Interp Non-reactive Non-reactive   CBC Auto Differential   Result Value Ref Range    WBC 9.6 4.0 - 11.0 K/uL    RBC 4.98 4.00 - 5.20 M/uL    Hemoglobin 15.9 12.0 - 16.0 g/dL    Hematocrit 56.1 53.7 - 48.0 %    MCV 91.5 80.0 - 100.0 fL    MCH 31.9 26.0 - 34.0 pg    MCHC 34.9 31.0 - 36.0 g/dL    RDW  13.2 12.4 - 15.4 %    Platelets 294 135 - 450 K/uL    MPV 8.6 5.0 - 10.5 fL    Neutrophils % 58.9 %    Lymphocytes % 33.3 %    Monocytes % 4.7 %    Eosinophils % 2.4 %    Basophils % 0.7 %    Neutrophils Absolute 5.7 1.7 - 7.7 K/uL    Lymphocytes Absolute 3.2 1.0 - 5.1 K/uL    Monocytes Absolute 0.5 0.0 - 1.3 K/uL    Eosinophils Absolute 0.2 0.0 - 0.6 K/uL    Basophils Absolute 0.1 0.0 - 0.2 K/uL   POCT Urinalysis no Micro   Result Value Ref Range    Color, UA Yellow     Clarity, UA Clear     Glucose, UA POC Negative     Bilirubin, UA Negative     Ketones, UA Negative     Spec Grav, UA 1.015     Blood, UA POC Trace-intact     pH, UA 6.0     Protein, UA POC Negative     Urobilinogen, UA 0.2     Leukocytes, UA Trace     Nitrite, UA Negative      Impression:       1. Spondylosis without myelopathy or radiculopathy, lumbar region    2. DDD (degenerative disc disease), lumbar        Plan:  Clinical Course: Above diagnoses are worsening    I discussed the diagnosis and the treatment options with Lisa York today.     In Summary:  The  various treatment options were outlined and discussed with Lisa York including:  Conservative care options: physical therapy, ice, medications, bracing, and activity modification. The indications for therapeutic injections. The indications for additional imaging/laboratory studies.  The indications for (possible future) interventions.     After considering the various options discussed, JANISSE GHAN elected to pursue a course of treatment that includes the following:    1. Medications:    OARRS reviewed and appropriate. Low risk on ORT. 4 A's reviewed. Begin current regimen of Tramadol 50 mg 1 po BID #14.    Informed verbal consent was obtained.   Goals of the current treatment regimen include: improvement in pain, improvement in overall in physical performance, ability to perform daily activities, work or disability, emotional distress, health care utilization and decreased medication consumption.     Progress will be monitored towards achieving/maintaining therapeutic goals:    1. Improving perceived interference of pain with ADL's, ability to perform HEP, continue to improve in overall flexibility, ROM, strength and endurance, ability to do household activities indoor and/or outdoor, work and social/leisure activities. Improve psychosocial and physical functioning.    2. Improving sleep to 6-7 hours a night. Improve mood/ anxiety and depression symptoms    3. Reduction of reliance on opioid analgesia/more appropriate opioid use. Utilization of adjuvant medications as appropriate.       Risks and benefits of the medications and alternative treatments have been discussed with the patient. The patient was advised against drinking alcohol with the opioid pain medicines, advised against driving or handling machinery when starting or adjusting the dose of medicines, feeling groggy or drowsy, or if having any cognitive issues related to the current medications. The patient is fully aware of the risk of overdose and  death, if medicines are misused and not taken as prescribed. If the patient develops new symptoms or if the symptoms worsen,  the patient was told to call the office.    RX Monitoring 09/09/2016   Attestation The Prescription Monitoring Report for this patient was reviewed today.   Periodic Controlled Substance Monitoring No signs of potential drug abuse or diversion identified.       2. PT:  Encouraged to continue with Home exercise program.    3. Further studies: No further studies.      4. Interventional:  We discussed pursuing lumbar medial branch blocks for diagnostic purposes. Based on physical exam findings of increased pain with facet loading and radiologic imaging, we will pursue lumbar medial branch blocks at the Bilateral L3, L4, L5 DR levels. Risks, benefits and alternatives were discussed. These include but are not limited to bleeding, infection, increased pain, lack of pain relief and nerve injury. The patient verbalized understanding and would like to proceed. If there is significant pain relief and functional improvement, the patient may be a good candidate for Radiofrequency ablation. MBB #1.    5. Follow up:  2-3 weeks      Lisa York was instructed to call the office if her symptoms worsen or if new symptoms appear prior to the next scheduled visit. She is specifically instructed to contact the office between now & her scheduled appointment if she has concerns related to her condition or if she needs assistance in scheduling the above tests. She is welcome to call for an appointment sooner if she has any additional concerns or questions.       I, Stefanie I. Chambers, ATC, am scribing for and in the presence of Armen Pickup. Paxtyn Wisdom, PA.  02/02/19 10:53 AM Stefanie I. Chambers, ATC.      The physical examination was performed between the patient and Armen Pickup. Johne Buckle, PA.  All counseling during the appointment was performed between the patient and the provider.      Lauralyn Primes. Quinn Bartling, PA-C,  personally performed the services described in this documentation as scribed by Stefanie I. Chambers, ATC in my presence and it is both accurate and complete.            Babs Bertin, MSBS, PA-C  Board Certified by the [employer] on Certification of Physician Assistants  [facility]  Partner of [facility]      This dictation was performed with a verbal recognition program [facility]) and it was checked for errors. It is possible that there are still dictated errors within this office note. If so, please bring any errors to my attention for an addendum. All efforts were made to ensure that this office note is accurate.

## 2019-02-02 ENCOUNTER — Ambulatory Visit: Admit: 2019-02-02 | Discharge: 2019-02-02 | Payer: MEDICARE | Attending: Family Medicine | Primary: Family Medicine

## 2019-02-02 DIAGNOSIS — Z01818 Encounter for other preprocedural examination: Secondary | ICD-10-CM

## 2019-02-02 NOTE — Telephone Encounter (Signed)
DOS   02/07/2019  CPT   64493.50  64494.50   77003.26  [id number]  DX   M47.816   M51.36  OP SX AUTH  NPR    BILATERAL  LEVELS   L3  L4  L5   PROCEDURE   MBB  DR. SINGLA    [place]  INSURANCE:   MEDICARE

## 2019-02-02 NOTE — Progress Notes (Signed)
Lisa York  Date of Birth:  [birth year]    Date of Service:  02/02/2019      Wt Readings from Last 2 Encounters:   02/02/19 176 lb 12.8 oz (80.2 kg)   02/01/19 184 lb 1.4 oz (83.5 kg)       BP Readings from Last 3 Encounters:   02/02/19 124/78   02/01/19 132/84   01/26/19 134/80        Chief Complaint   Patient presents with   ??? Pre-op Exam     [facility]/Eyelid Surgery/02-13-2019       No Known Allergies    Outpatient Medications Marked as Taking for the 02/02/19 encounter (Office Visit) with Margaretmary Dys, MD   Medication Sig Dispense Refill   ??? traMADol (ULTRAM) 50 MG tablet Take 1 tablet by mouth 2 times daily for 7 days. 14 tablet 0   ??? ibuprofen (ADVIL;MOTRIN) 800 MG tablet TAKE 1 TABLET BY MOUTH 3 TIMES DAILY (WITH MEALS) 90 tablet 1   ??? PARoxetine (PAXIL) 40 MG tablet TAKE 1 TABLET BY MOUTH DAILY 30 tablet 5   ??? rosuvastatin (CRESTOR) 5 MG tablet Take 1 tablet by mouth three times a week 12 tablet 3   ??? fluticasone (FLONASE) 50 MCG/ACT nasal spray 1 spray by Nasal route daily 1 Bottle 5   ??? albuterol sulfate HFA (PROAIR HFA) 108 (90 Base) MCG/ACT inhaler INHALE 2 PUFFS INTO THE LUNGS EVERY 6 HOURS AS NEEDED FOR WHEEZING 8.5 g 5   ??? omeprazole (PRILOSEC) 40 MG delayed release capsule TAKE 1 CAPSULE BY MOUTH DAILY 30 capsule 5       This patient presents to the office today for a preoperative consultation at the request of surgeon, Dr. Sophronia Simas, who plans on performing Eyelid Surgery on February 17 at [facility].  The current problem began 2 months ago, and symptoms have been worsening with time.  Conservative therapy: No.    Plans on injection for back pain next week.  Medial branch block lumbar.      Planned anesthesia: Local and MAC   Known anesthesia problems: Nausea   Bleeding risk: No recent or remote history of abnormal bleeding  Personal or FH of DVT/PE: No        Past Medical History:   Diagnosis Date   ??? Anxiety    ??? PONV (postoperative nausea and vomiting)        Past Surgical History:   Procedure Laterality  Date   ??? APPENDECTOMY     ??? CHOLECYSTECTOMY  06/18/2014    Laparoscopic   ??? FOOT SURGERY Left     x 2   ??? HERNIA REPAIR     ??? KNEE SURGERY Left    ??? SHOULDER SURGERY Right    ??? TUBAL LIGATION         Family History   Problem Relation Age of Onset   ??? Cancer Mother         colon   ??? Cancer Sister         colon   ??? Cancer Brother         colon   ??? Cancer Brother         bladder, lung, throat   ??? Cancer Brother         prostate   ??? Cancer Brother         skin       Social History     Socioeconomic History   ??? Marital status: Widowed  Spouse name: Not on file   ??? Number of children: Not on file   ??? Years of education: Not on file   ??? Highest education level: Not on file   Occupational History   ??? Not on file   Social Needs   ??? Financial resource strain: Not on file   ??? Food insecurity:     Worry: Not on file     Inability: Not on file   ??? Transportation needs:     Medical: Not on file     Non-medical: Not on file   Tobacco Use   ??? Smoking status: Current Every Day Smoker     Packs/day: 1.00     Years: 53.00     Pack years: 53.00     Types: Cigarettes     Start date: 02/07/1965   ??? Smokeless tobacco: Never Used   Substance and Sexual Activity   ??? Alcohol use: Yes   ??? Drug use: Never   ??? Sexual activity: Not Currently   Lifestyle   ??? Physical activity:     Days per week: Not on file     Minutes per session: Not on file   ??? Stress: Not on file   Relationships   ??? Social connections:     Talks on phone: Not on file     Gets together: Not on file     Attends religious service: Not on file     Active member of club or organization: Not on file     Attends meetings of clubs or organizations: Not on file     Relationship status: Not on file   ??? Intimate partner violence:     Fear of current or ex partner: Not on file     Emotionally abused: Not on file     Physically abused: Not on file     Forced sexual activity: Not on file   Other Topics Concern   ??? Not on file   Social History Narrative   ??? Not on file       Review of  Systems  Review of Systems   Constitutional: Negative for chills, fatigue and fever.   Respiratory: Negative for chest tightness and shortness of breath.    Cardiovascular: Negative for chest pain and palpitations.   Gastrointestinal: Negative for blood in stool, constipation and diarrhea.   Musculoskeletal: Positive for back pain.   Neurological: Negative for dizziness, light-headedness and headaches.       Vitals:    02/02/19 1301   BP: 124/78   Pulse: 95   SpO2: 98%   Weight: 176 lb 12.8 oz (80.2 kg)   Height: 5' 4.96" (1.65 m)        Physical Exam   Physical Exam  Constitutional:       Appearance: She is well-developed.   HENT:      Head: Normocephalic and atraumatic.      Right Ear: Hearing normal.      Left Ear: Hearing normal.      Ears:        Comments: Ptosis bilat.    Eyes:      Conjunctiva/sclera: Conjunctivae normal.   Neck:      Trachea: No tracheal deviation.   Cardiovascular:      Rate and Rhythm: Normal rate and regular rhythm.   Pulmonary:      Effort: Pulmonary effort is normal.      Breath sounds: Normal breath sounds.   Abdominal:  Palpations: Abdomen is soft.      Tenderness: There is no abdominal tenderness.   Skin:     General: Skin is warm and dry.   Neurological:      Mental Status: She is alert and oriented to person, place, and time.   Psychiatric:         Mood and Affect: Mood normal.         Behavior: Behavior normal.            Assessment:       68 y.o. patient with planned surgery as above.    Known risk factors for perioperative complications: None  Current medications which may produce withdrawal symptoms if withheld perioperatively: none   1. Preop examination  Ok to proceed w/ planned procedure.  Low risk procedure.      2. Ptosis, left eyelid  See above    3. Ptosis, right eyelid  See above    4. Dermatochalasis of both upper eyelids  See above.  Reviewed note from [facility]    5. Brow ptosis, bilateral  See above    6. Visual field defect  See above.         Plan:     1.  Preoperative workup as follows:none  2. Change in medication regimen before surgery: Discontinue NSAIDs (ibu) 5 days before surgery  3. Prophylaxis forcardiac events with perioperative beta-blockers: Not indicated  ACC/AHA indications for pre-operative beta-blocker use:    ?? Vascular surgery with history of postitive stress test  ?? Intermediate or high risk surgery with history of CAD   ?? Intermediate or high risk surgery with multiple clinical predictors of CAD- 2 of the following: history of compensated or prior heart failure, history of cerebrovascular disease, DM, or renal insufficiency    Routine administration of higher-dose, long-acting metoprolol in beta-blocker-na??ve patients on the day of surgery, and in the absence of dose titration is associated with an overall increase in mortality.  Beta-blockers should be started days to weeks prior to surgery and titrated to pulse < 70.  4. Deep vein thrombosis prophylaxis: n/a  5. No contraindications to planned surgery        Lucienne Minks. Megan Salon, MD          Scribe attestation:  I, Lauretta Grill RMA, am scribing for and in the presence of Margaretmary Dys, MD. Electronically signed by Lauretta Grill RMA on 02/02/2019 at 1:45 PM    Physician attestation: Corlis Leak, MD, personally performed the services scribed by the user listed above in my presence, and it is both accurate and complete. I agree with the ROS and Past Histories independently gathered by the clinical support staff and the remaining scribed note accurately describes my personal service to the patient.    _0 @    02/02/2019  1:46 PM

## 2019-02-02 NOTE — Patient Instructions (Signed)
Patient Education        Learning About Benefits From Quitting Smoking  How does quitting smoking make you healthier?    If you're thinking about quitting smoking, you may have a few reasons to be smoke-free. Your health may be one of them.  ?? When you quit smoking, you lower your risks for cancer, lung disease, heart attack, stroke, blood vessel disease, and blindness from macular degeneration.  ?? When you're smoke-free, you get sick less often, and you heal faster. You are less likely to get colds, flu, bronchitis, and pneumonia.  ?? As a nonsmoker, you may find that your mood is better and you are less stressed.  When and how will you feel healthier?  Quitting has real health benefits that start from day 1 of being smoke-free. And the longer you stay smoke-free, the healthier you get and the better you feel.  The first hours  ?? After just 20 minutes, your blood pressure and heart rate go down. That means there's less stress on your heart and blood vessels.  ?? Within 12 hours, the level of carbon monoxide in your blood drops back to normal. That makes room for more oxygen. With more oxygen in your body, you may notice that you have more energy than when you smoked.  After 2 weeks  ?? Your lungs start to work better.  ?? Your risk of heart attack starts to drop.  After 1 month  ?? When your lungs are clear, you cough less and breathe deeper, so it's easier to be active.  ?? Your sense of taste and smell return. That means you can enjoy food more than you have since you started smoking.  Over the years  ?? After 1 year, your risk of heart disease is half what it would be if you kept smoking.  ?? After 5 years, your risk of stroke starts to shrink. Within a few years after that, it's about the same as if you'd never smoked.  ?? After 10 years, your risk of dying from lung cancer is cut by about half. And your risk for many other types of cancer is lower too.  How would quitting help others in your life?  When you quit  smoking, you improve the health of everyone who now breathes in your smoke.  ?? Their heart, lung, and cancer risks drop, much like yours.  ?? They are sick less. For babies and small children, living smoke-free means they're less likely to have ear infections, pneumonia, and bronchitis.  ?? If you're a woman who is or will be pregnant someday, quitting smoking means a healthier newborn.  ?? Children who are close to you are less likely to become adult smokers.  Where can you learn more?  Go to [email or web address] and sign in to your MyChart account. Enter O319 in the Search Health Information box to learn more about "Learning About Benefits From Quitting Smoking."     If you do not have an account, please click on the "Sign Up Now" link.  Current as of: June 30, 2018  Content Version: 12.3  ?? 2006-2019 [employer]. Care instructions adapted under license by [facility]. If you have questions about a medical condition or this instruction, always ask your healthcare professional. [employer] disclaims any warranty or liability for your use of this information.

## 2019-02-03 ENCOUNTER — Encounter

## 2019-02-03 MED ORDER — OMEPRAZOLE 40 MG PO CPDR
40 MG | ORAL_CAPSULE | Freq: Every day | ORAL | 4 refills | Status: AC
Start: 2019-02-03 — End: ?

## 2019-02-03 NOTE — Telephone Encounter (Signed)
Last office visit 02/02/2019, advised to follow-up 0, next appointment 02/14/2019.

## 2019-02-07 NOTE — Telephone Encounter (Signed)
Patient no showed to mbb 1 on 02/07/19. She can not r/s procedure. She will need an additional office appt.

## 2019-02-07 NOTE — Telephone Encounter (Signed)
S/w patient who no showed to her injection. She confused her injection date with her follow up date. Patient has been r/s for her injection. She has been scheduled for an office appt for medication request and r/s her f/u a injection

## 2019-02-07 NOTE — Telephone Encounter (Signed)
PATIENT IS CALLING REGARDING MISSED INJ APPT/NEEDS TO RESCHEDULE

## 2019-02-08 ENCOUNTER — Ambulatory Visit: Admit: 2019-02-08 | Discharge: 2019-02-08 | Payer: MEDICARE | Attending: Physician Assistant | Primary: Family Medicine

## 2019-02-08 DIAGNOSIS — M47816 Spondylosis without myelopathy or radiculopathy, lumbar region: Secondary | ICD-10-CM

## 2019-02-08 MED ORDER — HYDROCODONE-ACETAMINOPHEN 5-325 MG PO TABS
5-325 MG | ORAL_TABLET | Freq: Three times a day (TID) | ORAL | 0 refills | Status: AC
Start: 2019-02-08 — End: 2019-02-15

## 2019-02-08 NOTE — Progress Notes (Signed)
Follow up: Lisa York    Lisa York  [birth year]  [id number]         Chief Complaint   Patient presents with   ??? Lower Back Pain     F/u LSP.  ESI r/s to 02/21/19         HISTORY OF PRESENT ILLNESS:  [name] is a 68 y.o. female returns for a follow up visit for multiple medical problems.  Her current presenting problems are   1. Spondylosis without myelopathy or radiculopathy, lumbar region    2. DDD (degenerative disc disease), lumbar    3. Lumbar radiculopathy    .    As per information/history obtained from the PADT(patient assessment and documentation tool) - She complains of pain in the lower back with radiation to the hips Bilateral and upper leg Left She rates the pain 8/10 and describes it as aching, throbbing.  Pain is made worse by: movement, walking, standing, bending, all activities.   She denies side effects from the current pain regimen.Patient reports that since the last follow up visit the physical functioning is unchanged, family/social relationships are unchanged, mood is unchanged and sleep patterns are unchanged, and that the overall functioning is unchanged.  Patient denies neurological bowel or bladder.       [name] presents today for follow up of her ongoing low back and bilateral hip pain, along with left leg pain.  She was scheduled to undergo an ESI on 02/07/19 but states getting confused about her procedure and arrival time and missed her injection.  She has been rescheduled to 02/21/19 at this time.  She states since her last visit, no significant changes overall in her symptoms or pain level.  She denies any new injuries or traumas to her back or hips since her last visit.        Associated signs and symptoms:   Neurogenic bowel or bladder symptoms:  yes   Perceived weakness:  no   Difficulty walking:  yes              Past Medical History:   Past Medical History:   Diagnosis Date   ??? Anxiety    ??? PONV (postoperative nausea and vomiting)       Past Surgical History:     Past Surgical History:    Procedure Laterality Date   ??? APPENDECTOMY     ??? CHOLECYSTECTOMY  06/18/2014    Laparoscopic   ??? FOOT SURGERY Left     x 2   ??? HERNIA REPAIR     ??? KNEE SURGERY Left    ??? SHOULDER SURGERY Right    ??? TUBAL LIGATION       Current Medications:     Current Outpatient Medications:   ???  HYDROcodone-acetaminophen (NORCO) 5-325 MG per tablet, Take 1 tablet by mouth 3 times daily for 7 days., Disp: 21 tablet, Rfl: 0  ???  omeprazole (PRILOSEC) 40 MG delayed release capsule, TAKE 1 CAPSULE BY MOUTH DAILY, Disp: 30 capsule, Rfl: 4  ???  traMADol (ULTRAM) 50 MG tablet, Take 1 tablet by mouth 2 times daily for 7 days., Disp: 14 tablet, Rfl: 0  ???  ibuprofen (ADVIL;MOTRIN) 800 MG tablet, TAKE 1 TABLET BY MOUTH 3 TIMES DAILY (WITH MEALS), Disp: 90 tablet, Rfl: 1  ???  PARoxetine (PAXIL) 40 MG tablet, TAKE 1 TABLET BY MOUTH DAILY, Disp: 30 tablet, Rfl: 5  ???  fluticasone (FLONASE) 50 MCG/ACT nasal spray, 1 spray by Nasal route daily,  Disp: 1 Bottle, Rfl: 5  ???  albuterol sulfate HFA (PROAIR HFA) 108 (90 Base) MCG/ACT inhaler, INHALE 2 PUFFS INTO THE LUNGS EVERY 6 HOURS AS NEEDED FOR WHEEZING, Disp: 8.5 g, Rfl: 5  ???  rosuvastatin (CRESTOR) 5 MG tablet, Take 1 tablet by mouth three times a week, Disp: 12 tablet, Rfl: 3  Allergies:  Patient has no known allergies.  Social History:    reports that she has been smoking cigarettes. She started smoking about 54 years ago. She has a 53.00 pack-year smoking history. She has never used smokeless tobacco. She reports current alcohol use. She reports that she does not use drugs.  Family History:   Family History   Problem Relation Age of Onset   ??? Cancer Mother         colon   ??? Cancer Sister         colon   ??? Cancer Brother         colon   ??? Cancer Brother         bladder, lung, throat   ??? Cancer Brother         prostate   ??? Cancer Brother         skin       REVIEW OF SYSTEMS:   CONSTITUTIONAL: Denies unexplained weight loss, fevers, chills or fatigue  NEUROLOGICAL: Denies unsteady gait or progressive  weakness  MUSCULOSKELETAL: Denies joint swelling or redness  GI: Denies nausea, vomiting, diarrhea   GU: Denies bowel or bladder issues       PHYSICAL EXAM:    Vitals: Blood pressure (!) 148/103, pulse 93, height 5' 4.37" (1.635 m), weight 176 lb 12.9 oz (80.2 kg).    GENERAL EXAM:  ?? General Apparence: Patient is adequately groomed with no evidence of malnutrition.  ?? Psychiatric: Orientation: The patient is oriented to time, place and person. The patient's mood and affect are appropriate   ?? Vascular: Examination reveals no swelling and palpation reveals no tenderness in upper or lower extremities. Good capillary refill.   ?? The lymphatic examination of the neck, axillae and groin reveals all areas to be without enlargement or induration  ?? Sensation is intact without deficit in the upper and lower extremities to light touch and pinprick  ?? Coordination of the upper and lower extremities are normal.  ?? RIGHT UPPER EXTREMITY:  Inspection/examination of the right upper extremity does not show any tenderness, deformity or injury. Range of motion is unremarkable and pain-free. There is no gross instability.  There are no rashes, ulcerations or lesions. Strength and tone are normal. No atrophy or abnormal movements are noted.  ?? LEFT UPPER EXTREMITY: Inspection/examination of the left upper extremity does not show any tenderness, deformity or injury. Range of motion is unremarkable and pain-free. There is no gross instability.  There are no rashes, ulcerations or lesions. Strength and tone are normal. No atrophy or abnormal movements are noted.  ??  LUMBAR/SACRAL EXAMINATION:  ?? Inspection: Local inspection shows no step-off or bruising.  Lumbar alignment is normal. No instability is noted.  ?? Palpation:   No evidence of tenderness at the midline.  Lumbar paraspinal tenderness: Mild L4/5 and L5/S1 tenderness  Bursal tenderness No tenderness bilaterally  There is no paraspinal spasm.  ?? Range of Motion: limited by 50% in  all planes due to pain specifically with extension and facet loading bilaterally.  ?? Strength:   Strength testing is 5/5 in all muscle groups tested.  ?? Special  Tests:   Straight leg raise and crossed SLR negative.Patrick's testing is negative bilaterally. FADIR's testing is negative bilaterally. Bowstring test negative. Slump test negative.   ?? Skin: There are no rashes, ulcerations or lesions.  ?? Reflexes: Reflexes are symmetrically 2+ at the patellar and ankle tendons.  Clonus absent bilaterally at the feet.  ?? Gait & station: normal, patient ambulates without assistance and no ataxia  ?? Additional Examinations:  ?? RIGHT LOWER EXTREMITY: Inspection/examination of the right lower extremity does not show any tenderness, deformity or injury. Range of motion is normal and pain-free. There is no gross instability.  There are no rashes, ulcerations or lesions. Strength and tone are normal. No atrophy or abnormal movements are noted.  ?? LEFT LOWER EXTREMITY:  Inspection/examination of the left lower extremity does not show any tenderness, deformity or injury. Range of motion is normal and pain-free. There is no gross instability. There are no rashes, ulcerations or lesions.  Strength and tone are normal. No atrophy or abnormal movements are noted.      Diagnostic Testing:    No new diagnostics  Results for orders placed or performed in visit on 09/07/18   URINE CULTURE   Result Value Ref Range    Urine Culture, Routine       <50,000 CFU/ml mixed skin/urogenital flora. No further workup   Lipid Panel   Result Value Ref Range    Cholesterol, Total 225 (H) 0 - 199 mg/dL    Triglycerides 546 (H) 0 - 150 mg/dL    HDL 39 (L) 40 - 60 mg/dL    LDL Calculated 568 (H) <100 mg/dL    VLDL Cholesterol Calculated 44 Not Established mg/dL   Hemoglobin L2X   Result Value Ref Range    Hemoglobin A1C 6.6 See comment %    eAG 142.7 mg/dL   Comprehensive Metabolic Panel   Result Value Ref Range    Sodium 141 136 - 145 mmol/L    Potassium  5.0 3.5 - 5.1 mmol/L    Chloride 100 99 - 110 mmol/L    CO2 25 21 - 32 mmol/L    Anion Gap 16 3 - 16    Glucose 143 (H) 70 - 99 mg/dL    BUN 7 7 - 20 mg/dL    CREATININE 0.6 0.6 - 1.2 mg/dL    GFR Non-African American >60 >60    GFR African American >60 >60    Calcium 10.1 8.3 - 10.6 mg/dL    Total Protein 7.6 6.4 - 8.2 g/dL    Alb 5.0 3.4 - 5.0 g/dL    Albumin/Globulin Ratio 1.9 1.1 - 2.2    Total Bilirubin 0.8 0.0 - 1.0 mg/dL    Alkaline Phosphatase 102 40 - 129 U/L    ALT 55 (H) 10 - 40 U/L    AST 35 15 - 37 U/L    Globulin 2.6 g/dL   Hepatitis C Antibody   Result Value Ref Range    Hep C Ab Interp Non-reactive Non-reactive   CBC Auto Differential   Result Value Ref Range    WBC 9.6 4.0 - 11.0 K/uL    RBC 4.98 4.00 - 5.20 M/uL    Hemoglobin 15.9 12.0 - 16.0 g/dL    Hematocrit 51.7 00.1 - 48.0 %    MCV 91.5 80.0 - 100.0 fL    MCH 31.9 26.0 - 34.0 pg    MCHC 34.9 31.0 - 36.0 g/dL    RDW 74.9 44.9 - 67.5 %  Platelets 294 135 - 450 K/uL    MPV 8.6 5.0 - 10.5 fL    Neutrophils % 58.9 %    Lymphocytes % 33.3 %    Monocytes % 4.7 %    Eosinophils % 2.4 %    Basophils % 0.7 %    Neutrophils Absolute 5.7 1.7 - 7.7 K/uL    Lymphocytes Absolute 3.2 1.0 - 5.1 K/uL    Monocytes Absolute 0.5 0.0 - 1.3 K/uL    Eosinophils Absolute 0.2 0.0 - 0.6 K/uL    Basophils Absolute 0.1 0.0 - 0.2 K/uL   POCT Urinalysis no Micro   Result Value Ref Range    Color, UA Yellow     Clarity, UA Clear     Glucose, UA POC Negative     Bilirubin, UA Negative     Ketones, UA Negative     Spec Grav, UA 1.015     Blood, UA POC Trace-intact     pH, UA 6.0     Protein, UA POC Negative     Urobilinogen, UA 0.2     Leukocytes, UA Trace     Nitrite, UA Negative      Impression:       1. Spondylosis without myelopathy or radiculopathy, lumbar region    2. DDD (degenerative disc disease), lumbar    3. Lumbar radiculopathy        Plan:  Clinical Course: shows no change    I discussed the diagnosis and the treatment options with Lisa York today.     In  Summary:  The various treatment options were outlined and discussed with Lisa York including:  Conservative care options: physical therapy, ice, medications, bracing, and activity modification. The indications for therapeutic injections. The indications for additional imaging/laboratory studies.  The indications for (possible future) interventions.     After considering the various options discussed, Lisa York elected to pursue a course of treatment that includes the following:    1. Medications:    OARRS reviewed and appropriate. Low risk on ORT. 4 A's reviewed. Begin Norco 5-325 mg 1 po TID #21.  Opioid agreement was signed.   Informed verbal consent was obtained.   Goals of the current treatment regimen include: improvement in pain, improvement in overall in physical performance, ability to perform daily activities, work or disability, emotional distress, health care utilization and decreased medication consumption.     Progress will be monitored towards achieving/maintaining therapeutic goals:    1. Improving perceived interference of pain with ADL's, ability to perform HEP, continue to improve in overall flexibility, ROM, strength and endurance, ability to do household activities indoor and/or outdoor, work and social/leisure activities. Improve psychosocial and physical functioning.    2. Improving sleep to 6-7 hours a night. Improve mood/ anxiety and depression symptoms    3. Reduction of reliance on opioid analgesia/more appropriate opioid use. Utilization of adjuvant medications as appropriate.       Risks and benefits of the medications and alternative treatments have been discussed with the patient. The patient was advised against drinking alcohol with the opioid pain medicines, advised against driving or handling machinery when starting or adjusting the dose of medicines, feeling groggy or drowsy, or if having any cognitive issues related to the current medications. The patient is fully aware of  the risk of overdose and death, if medicines are misused and not taken as prescribed. If the patient develops new symptoms or if the symptoms worsen, the patient  was told to call the office.    RX Monitoring 09/09/2016   Attestation The Prescription Monitoring Report for this patient was reviewed today.   Periodic Controlled Substance Monitoring No signs of potential drug abuse or diversion identified.       2. PT:  Encouraged to continue with Home exercise program.    3. Further studies: No further studies.      4. Interventional:  The patient is scheduled for an ESI on 02/21/19.    5. Follow up:  4-6 weeks      Lisa FordyceLinda S York was instructed to call the office if her symptoms worsen or if new symptoms appear prior to the next scheduled visit. She is specifically instructed to contact the office between now & her scheduled appointment if she has concerns related to her condition or if she needs assistance in scheduling the above tests. She is welcome to call for an appointment sooner if she has any additional concerns or questions.       I, Lisa York, ATC, am scribing for and in the presence of Lisa Pickupllen M. Shelonda Saxe, PA.  02/08/19 4:54 PM Lisa York, ATC.      The physical examination was performed between the patient and Lisa Pickupllen M. Shacarra Choe, PA.  All counseling during the appointment was performed between the patient and the provider.      Lisa York, Lisa Geraci M. Dru Primeau, PA-C, personally performed the services described in this documentation as scribed by Lisa York, ATC in my presence and it is both accurate and complete.            Lisa York, MSBS, PA-C  Board Certified by the [employer] Committee on Certification of Physician Assistants  [facility]  [facility]         This dictation was performed with a verbal recognition program [facility](DRAGON) and it was checked for errors. It is possible that there are still dictated errors within this office note. If so,  please bring any errors to my attention for an addendum. All efforts were made to ensure that this office note is accurate.

## 2019-02-14 ENCOUNTER — Encounter: Payer: MEDICARE | Primary: Family Medicine

## 2019-02-14 ENCOUNTER — Encounter: Attending: Physician Assistant | Primary: Family Medicine

## 2019-02-20 NOTE — Telephone Encounter (Signed)
PT WANTS TO KNOW IF JOGGING PAINTS OS OK TO WEAR TO PROCEDURE TOMMORROW

## 2019-02-21 ENCOUNTER — Encounter: Payer: MEDICARE | Primary: Family Medicine

## 2019-02-21 ENCOUNTER — Inpatient Hospital Stay: Payer: MEDICARE

## 2019-02-21 ENCOUNTER — Ambulatory Visit: Admit: 2019-02-21 | Discharge: 2019-02-21 | Payer: MEDICARE | Attending: Physician Assistant | Primary: Family Medicine

## 2019-02-21 DIAGNOSIS — M47816 Spondylosis without myelopathy or radiculopathy, lumbar region: Secondary | ICD-10-CM

## 2019-02-21 MED ORDER — IOPAMIDOL 61 % IJ SOLN
61 | INTRAMUSCULAR | Status: AC
Start: 2019-02-21 — End: 2019-02-21

## 2019-02-21 MED ORDER — MIDAZOLAM HCL 2 MG/2ML IJ SOLN
2 | INTRAMUSCULAR | Status: AC
Start: 2019-02-21 — End: 2019-02-21

## 2019-02-21 MED ORDER — OXYCODONE-ACETAMINOPHEN 5-325 MG PO TABS
5-325 MG | ORAL_TABLET | Freq: Three times a day (TID) | ORAL | 0 refills | Status: AC
Start: 2019-02-21 — End: 2019-02-28

## 2019-02-21 MED ORDER — BUPIVACAINE HCL (PF) 0.5 % IJ SOLN
0.5 % | INTRAMUSCULAR | Status: DC | PRN
Start: 2019-02-21 — End: 2019-02-21
  Administered 2019-02-21: 12:00:00 4 via EPIDURAL

## 2019-02-21 MED ORDER — MIDAZOLAM HCL 2 MG/2ML IJ SOLN
2 MG/ML | INTRAMUSCULAR | Status: DC | PRN
Start: 2019-02-21 — End: 2019-02-21
  Administered 2019-02-21: 12:00:00 2 via INTRAVENOUS

## 2019-02-21 MED ORDER — TIZANIDINE HCL 4 MG PO TABS
4 MG | ORAL_TABLET | Freq: Every evening | ORAL | 0 refills | Status: DC
Start: 2019-02-21 — End: 2019-03-09

## 2019-02-21 MED ORDER — IOHEXOL 240 MG/ML IJ SOLN
240 MG/ML | INTRAMUSCULAR | Status: DC | PRN
Start: 2019-02-21 — End: 2019-02-21
  Administered 2019-02-21: 12:00:00 1 via EPIDURAL

## 2019-02-21 MED ORDER — BUPIVACAINE HCL (PF) 0.5 % IJ SOLN
0.5 | INTRAMUSCULAR | Status: AC
Start: 2019-02-21 — End: 2019-02-21

## 2019-02-21 MED FILL — MARCAINE PRESERVATIVE FREE 0.5 % IJ SOLN: 0.5 % | INTRAMUSCULAR | Qty: 10

## 2019-02-21 MED FILL — ISOVUE-M 300 61 % IJ SOLN: 61 % | INTRAMUSCULAR | Qty: 15

## 2019-02-21 MED FILL — MIDAZOLAM HCL 2 MG/2ML IJ SOLN: 2 mg/mL | INTRAMUSCULAR | Qty: 2

## 2019-02-21 NOTE — Progress Notes (Signed)
Follow up: SPINE    Lisa York  April 06, 1951  [id number]         Chief Complaint   Patient presents with   ??? Follow-up     med request          HISTORY OF PRESENT ILLNESS:  [name] is a 68 y.o. female returns for a follow up visit for multiple medical problems.  Her current presenting problems are   1. Spondylosis without myelopathy or radiculopathy, lumbar region    2. DDD (degenerative disc disease), lumbar    3. Facet hypertrophy of lumbar region    .    As per information/history obtained from the PADT(patient assessment and documentation tool) - She complains of pain in the lower back with radiation to the lower back She rates the pain 10/10 and describes it as sharp, dull, aching.  Pain is made worse by: everything.   She denies side effects from the current pain regimen.Patient reports that since the last follow up visit the physical functioning is worse, family/social relationships are worse, mood is worse and sleep patterns are worse, and that the overall functioning is worse.  Patient denies neurological bowel or bladder.     The patient presents today in follow-up for lower back pain.  The patient had a medial branch block completed today.  The patient is in a considerable amount of pain in the lower back but it does not radiate down either leg.  The patient describes that her pain is currently 10/10 in severity. The patient describes that she has tried many different medications including Tramadol and Norco, which do not help with her pain.  She describes that she is not sleeping well due to the pain.       Associated signs and symptoms:   Neurogenic bowel or bladder symptoms:  no   Perceived weakness:  no   Difficulty walking:  no              Past Medical History:   Past Medical History:   Diagnosis Date   ??? Anxiety    ??? PONV (postoperative nausea and vomiting)       Past Surgical History:     Past Surgical History:   Procedure Laterality Date   ??? APPENDECTOMY     ??? CHOLECYSTECTOMY  06/18/2014     Laparoscopic   ??? FOOT SURGERY Left     x 2   ??? HERNIA REPAIR     ??? KNEE SURGERY Left    ??? SHOULDER SURGERY Right    ??? TUBAL LIGATION       Current Medications:     Current Outpatient Medications:   ???  trimethoprim-polymyxin b (POLYTRIM) 10000-0.1 UNIT/ML-% ophthalmic solution, , Disp: , Rfl:   ???  PARoxetine (PAXIL) 10 MG tablet, Take by mouth, Disp: , Rfl:   ???  tiZANidine (ZANAFLEX) 4 MG tablet, Take 1 tablet by mouth nightly, Disp: 30 tablet, Rfl: 0  ???  oxyCODONE-acetaminophen (PERCOCET) 5-325 MG per tablet, Take 1 tablet by mouth 3 times daily for 7 days., Disp: 21 tablet, Rfl: 0  ???  omeprazole (PRILOSEC) 40 MG delayed release capsule, TAKE 1 CAPSULE BY MOUTH DAILY, Disp: 30 capsule, Rfl: 4  ???  ibuprofen (ADVIL;MOTRIN) 800 MG tablet, TAKE 1 TABLET BY MOUTH 3 TIMES DAILY (WITH MEALS), Disp: 90 tablet, Rfl: 1  ???  PARoxetine (PAXIL) 40 MG tablet, TAKE 1 TABLET BY MOUTH DAILY, Disp: 30 tablet, Rfl: 5  ???  fluticasone (FLONASE)  50 MCG/ACT nasal spray, 1 spray by Nasal route daily, Disp: 1 Bottle, Rfl: 5  ???  albuterol sulfate HFA (PROAIR HFA) 108 (90 Base) MCG/ACT inhaler, INHALE 2 PUFFS INTO THE LUNGS EVERY 6 HOURS AS NEEDED FOR WHEEZING, Disp: 8.5 g, Rfl: 5  ???  rosuvastatin (CRESTOR) 5 MG tablet, Take 1 tablet by mouth three times a week, Disp: 12 tablet, Rfl: 3  Allergies:  No known allergies  Social History:    reports that she has been smoking cigarettes. She started smoking about 54 years ago. She has a 53.00 pack-year smoking history. She has never used smokeless tobacco. She reports current alcohol use. She reports that she does not use drugs.  Family History:   Family History   Problem Relation Age of Onset   ??? Cancer Mother         colon   ??? Cancer Sister         colon   ??? Cancer Brother         colon   ??? Cancer Brother         bladder, lung, throat   ??? Cancer Brother         prostate   ??? Cancer Brother         skin       REVIEW OF SYSTEMS:   CONSTITUTIONAL: Denies unexplained weight loss, fevers, chills or  fatigue  NEUROLOGICAL: Denies unsteady gait or progressive weakness  MUSCULOSKELETAL: Denies joint swelling or redness  GI: Denies nausea, vomiting, diarrhea   GU: Denies bowel or bladder issues       PHYSICAL EXAM:    Vitals: Blood pressure (!) 136/96, pulse 100, height  (1.651 m), weight 169 lb 15.6 oz (77.1 kg).    GENERAL EXAM:  ?? General Apparence: Patient is adequately groomed with no evidence of malnutrition.  ?? Psychiatric: Orientation: The patient is oriented to time, place and person. The patient'York mood and affect are appropriate   ?? Vascular: Examination reveals no swelling and palpation reveals no tenderness in upper or lower extremities. Good capillary refill.   ?? The lymphatic examination of the neck, axillae and groin reveals all areas to be without enlargement or induration  ?? Sensation is intact without deficit in the upper and lower extremities to light touch and pinprick  ?? Coordination of the upper and lower extremities are normal.   ?? RIGHT UPPER EXTREMITY:  Inspection/examination of the right upper extremity does not show any tenderness, deformity or injury. Range of motion is unremarkable and pain-free. There is no gross instability.  There are no rashes, ulcerations or lesions. Strength and tone are normal. No atrophy or abnormal movements are noted.  ?? LEFT UPPER EXTREMITY: Inspection/examination of the left upper extremity does not show any tenderness, deformity or injury. Range of motion is unremarkable and pain-free. There is no gross instability.  There are no rashes, ulcerations or lesions. Strength and tone are normal. No atrophy or abnormal movements are noted.    LUMBAR/SACRAL EXAMINATION:  ?? Inspection: Local inspection shows no step-off or bruising.  Lumbar alignment is normal. No instability is noted.  ?? Palpation:   No evidence of tenderness at the midline.  Lumbar paraspinal tenderness: Very limited  Bursal tenderness No tenderness bilaterally  There is no paraspinal  spasm.  ?? Range of Motion: very limited due to pain  ?? Strength:   Strength testing is 5/5 in all muscle groups tested.  ?? Special Tests:   Straight leg  raise and crossed SLR negative. Patrick'York testing is negative bilaterally. FADIR'York testing is negative bilaterally. Bowstring test negative. Slump test negative.   ?? Skin: There are no rashes, ulcerations or lesions.  ?? Reflexes: Reflexes are symmetrically 2+ at the patellar and ankle tendons.  Clonus absent bilaterally at the feet.  ?? Gait & station: normal, patient ambulates without assistance and no ataxia  ?? Additional Examinations:  ?? RIGHT LOWER EXTREMITY: Inspection/examination of the right lower extremity does not show any tenderness, deformity or injury. Range of motion is normal and pain-free. There is no gross instability.  There are no rashes, ulcerations or lesions. Strength and tone are normal. No atrophy or abnormal movements are noted.  ?? LEFT LOWER EXTREMITY:  Inspection/examination of the left lower extremity does not show any tenderness, deformity or injury. Range of motion is normal and pain-free. There is no gross instability. There are no rashes, ulcerations or lesions.  Strength and tone are normal. No atrophy or abnormal movements are noted.      Diagnostic Testing:    MR Lumbar spine shows from 10/19/18:   FINDINGS:   BONES/ALIGNMENT: 1-2 mm retrolisthesis at L1-L2. ??The lumbar vertebral   bodies   are normal in height. ??Marrow signal is unremarkable. ??No acute or chronic   compression deformity.   ??   SPINAL CORD: The conus terminates normally.   ??   SOFT TISSUES: No paraspinal mass identified.   ??   L1-L2: Small left foraminal disc protrusion. ??No significant neural   foraminal   narrowing. ??Insert   ??   L2-L3: There is no significant disc herniation, spinal canal stenosis or   neural foraminal narrowing.   ??   L3-L4: There is no significant disc herniation, spinal canal stenosis or   neural foraminal narrowing.   ??   L4-L5: Bilateral  facet hypertrophy. ??Mild diffuse disc bulging. ??Mild left   and mild-moderate right neural foraminal narrowing. ??Lateral recess   effacement is present. ??There is mild spinal canal stenosis.   ??   Legally authenticated by Dewaine Oats 2018-10-19 14:13:09   ??   ??   ??   Patient Name: Lisa York, Lisa York   L5-S1: Bilateral facet hypertrophy. ??Diffuse disc bulging and endplate   spurring. ??Annular fissure. ??Moderate bilateral neural foraminal narrowing.   Mild lateral recess effacement. ??No significant spinal canal stenosis.   ??   IMPRESSION:   Mild-moderate neural foraminal narrowing. ??No significant spinal canal   stenosis.      Results for orders placed or performed in visit on 09/07/18   URINE CULTURE   Result Value Ref Range    Urine Culture, Routine       <50,000 CFU/ml mixed skin/urogenital flora. No further workup   Lipid Panel   Result Value Ref Range    Cholesterol, Total 225 (H) 0 - 199 mg/dL    Triglycerides 818 (H) 0 - 150 mg/dL    HDL 39 (L) 40 - 60 mg/dL    LDL Calculated 299 (H) <100 mg/dL    VLDL Cholesterol Calculated 44 Not Established mg/dL   Hemoglobin B7J   Result Value Ref Range    Hemoglobin A1C 6.6 See comment %    eAG 142.7 mg/dL   Comprehensive Metabolic Panel   Result Value Ref Range    Sodium 141 136 - 145 mmol/L    Potassium 5.0 3.5 - 5.1 mmol/L    Chloride 100 99 - 110 mmol/L    CO2 25 21 - 32  mmol/L    Anion Gap 16 3 - 16    Glucose 143 (H) 70 - 99 mg/dL    BUN 7 7 - 20 mg/dL    CREATININE 0.6 0.6 - 1.2 mg/dL    GFR Non-African American >60 >60    GFR African American >60 >60    Calcium 10.1 8.3 - 10.6 mg/dL    Total Protein 7.6 6.4 - 8.2 g/dL    Alb 5.0 3.4 - 5.0 g/dL    Albumin/Globulin Ratio 1.9 1.1 - 2.2    Total Bilirubin 0.8 0.0 - 1.0 mg/dL    Alkaline Phosphatase 102 40 - 129 U/L    ALT 55 (H) 10 - 40 U/L    AST 35 15 - 37 U/L    Globulin 2.6 g/dL   Hepatitis C Antibody   Result Value Ref Range    Hep C Ab Interp Non-reactive Non-reactive   CBC Auto Differential   Result Value Ref  Range    WBC 9.6 4.0 - 11.0 K/uL    RBC 4.98 4.00 - 5.20 M/uL    Hemoglobin 15.9 12.0 - 16.0 g/dL    Hematocrit 42.3 53.6 - 48.0 %    MCV 91.5 80.0 - 100.0 fL    MCH 31.9 26.0 - 34.0 pg    MCHC 34.9 31.0 - 36.0 g/dL    RDW 14.4 31.5 - 40.0 %    Platelets 294 135 - 450 K/uL    MPV 8.6 5.0 - 10.5 fL    Neutrophils % 58.9 %    Lymphocytes % 33.3 %    Monocytes % 4.7 %    Eosinophils % 2.4 %    Basophils % 0.7 %    Neutrophils Absolute 5.7 1.7 - 7.7 K/uL    Lymphocytes Absolute 3.2 1.0 - 5.1 K/uL    Monocytes Absolute 0.5 0.0 - 1.3 K/uL    Eosinophils Absolute 0.2 0.0 - 0.6 K/uL    Basophils Absolute 0.1 0.0 - 0.2 K/uL   POCT Urinalysis no Micro   Result Value Ref Range    Color, UA Yellow     Clarity, UA Clear     Glucose, UA POC Negative     Bilirubin, UA Negative     Ketones, UA Negative     Spec Grav, UA 1.015     Blood, UA POC Trace-intact     pH, UA 6.0     Protein, UA POC Negative     Urobilinogen, UA 0.2     Leukocytes, UA Trace     Nitrite, UA Negative      Impression:       1. Spondylosis without myelopathy or radiculopathy, lumbar region    2. DDD (degenerative disc disease), lumbar    3. Facet hypertrophy of lumbar region        Plan:  Clinical Course: Above diagnoses are worsening    I discussed the diagnosis and the treatment options with Lisa York today.     In Summary:  The various treatment options were outlined and discussed with Lisa York including:  Conservative care options: physical therapy, ice, medications, bracing, and activity modification. The indications for therapeutic injections. The indications for additional imaging/laboratory studies.  The indications for (possible future) interventions.     After considering the various options discussed, Lisa York elected to pursue a course of treatment that includes the following:    1. Medications:    OARRS reviewed and appropriate. Low risk on  ORT. 4 A'York reviewed. Begin Percocet 5-325 mg 1 po TID #21.  The patient was also provided Zanaflex  4 mg 1 po qhs #30. Oral swab testing was completed today in the office.   Informed verbal consent was obtained.   Goals of the current treatment regimen include: improvement in pain, improvement in overall in physical performance, ability to perform daily activities, work or disability, emotional distress, health care utilization and decreased medication consumption.     Progress will be monitored towards achieving/maintaining therapeutic goals:    1. Improving perceived interference of pain with ADL'York, ability to perform HEP, continue to improve in overall flexibility, ROM, strength and endurance, ability to do household activities indoor and/or outdoor, work and social/leisure activities. Improve psychosocial and physical functioning.    2. Improving sleep to 6-7 hours a night. Improve mood/ anxiety and depression symptoms    3. Reduction of reliance on opioid analgesia/more appropriate opioid use. Utilization of adjuvant medications as appropriate.       Risks and benefits of the medications and alternative treatments have been discussed with the patient. The patient was advised against drinking alcohol with the opioid pain medicines, advised against driving or handling machinery when starting or adjusting the dose of medicines, feeling groggy or drowsy, or if having any cognitive issues related to the current medications. The patient is fully aware of the risk of overdose and death, if medicines are misused and not taken as prescribed. If the patient develops new symptoms or if the symptoms worsen, the patient was told to call the office.    RX Monitoring 09/09/2016   Attestation The Prescription Monitoring Report for this patient was reviewed today.   Periodic Controlled Substance Monitoring No signs of potential drug abuse or diversion identified.         2. PT:  No PT at this time.    3. Further studies: No further studies.      4. Interventional: The patient will follow-up in 1 week to determine if a lumbar  medial branch block was successful as this was completed today.     5. Follow up:  One week.      Lisa York was instructed to call the office if her symptoms worsen or if new symptoms appear prior to the next scheduled visit. She is specifically instructed to contact the office between now & her scheduled appointment if she has concerns related to her condition or if she needs assistance in scheduling the above tests. She is welcome to call for an appointment sooner if she has any additional concerns or questions.             Babs Bertin, MSBS, PA-C  Board Certified by the [employer] on Certification of Physician Assistants  [facility]  Partner of [facility]               This dictation was performed with a verbal recognition program [facility]) and it was checked for errors. It is possible that there are still dictated errors within this office note. If so, please bring any errors to my attention for an addendum. All efforts were made to ensure that this office note is accurate.

## 2019-02-21 NOTE — Progress Notes (Signed)
Discharge instructions given to pt/family and verbalized understanding, states pain is at a un-tolerable level and pt has been instructed to visit the [place] office for an appt with Alvino Chapel, no numbness or weakness noted, pt wheeled out to car without complications

## 2019-02-21 NOTE — Discharge Instructions (Signed)
Pain Therapy Discharge Instructions  Media branch block- Dr Singla          Resume normal activities at home and monitor pain levels with activity.     . Do not drive today for 8 hours (24 hours if sedated)  Wear your seatbelt home.    . If you received sedation during your procedure, be advised for the next 24 hours:  o Do not drink alcohol  o Do not operate machinery  o Do not make any important decisions or sign any legal documents  o Have a responsible adult available to assist you  o You may experience light headedness, dizziness or sleepiness    . May apply ice to site for 15 minutes 3 times a day for 48 hours..  Avoid heat for 48 hours.    . Follow up as instructed.    . Keep site dry for 12 hours, then remove band-aid    . Use pain diary as directed    . Call if:  o Your symptoms worsen severely  o You develop a fever greater than 101?F    . Call [phone]  for follow-up appointments, questions or problems

## 2019-02-21 NOTE — H&P (Signed)
HISTORY AND PHYSICAL/PRE-SEDATION ASSESSMENT    Patient:  Lisa York, H[place]   DOB:  12/25/1951  Medical Record No.:  [id number]   Date:  02/21/2019  Physician:  Lura Em, M.D.  Facility: [facility]    HISTORY OF PRESENT ILLNESS:                 The patient is a 68 y.o. female whom presents with lower back pain. Review of the imaging and physical exam of the patient confirmed the pre-procedure diagnosis.  After a thorough discussion of risks, benefits and alternatives informed consent was obtained.                Past Medical History:   Past Medical History:   Diagnosis Date   ??? Anxiety    ??? PONV (postoperative nausea and vomiting)       Past Surgical History:     Past Surgical History:   Procedure Laterality Date   ??? APPENDECTOMY     ??? CHOLECYSTECTOMY  06/18/2014    Laparoscopic   ??? FOOT SURGERY Left     x 2   ??? HERNIA REPAIR     ??? KNEE SURGERY Left    ??? SHOULDER SURGERY Right    ??? TUBAL LIGATION       Current Medications:   Prior to Admission medications    Medication Sig Start Date End Date Taking? Authorizing Provider   omeprazole (PRILOSEC) 40 MG delayed release capsule TAKE 1 CAPSULE BY MOUTH DAILY 02/03/19  Yes Mirna Mires, MD   ibuprofen (ADVIL;MOTRIN) 800 MG tablet TAKE 1 TABLET BY MOUTH 3 TIMES DAILY (WITH MEALS) 12/12/18  Yes Daine Floras, APRN - CNP   PARoxetine (PAXIL) 40 MG tablet TAKE 1 TABLET BY MOUTH DAILY 11/14/18  Yes Daine Floras, APRN - CNP   fluticasone Aleda Grana) 50 MCG/ACT nasal spray 1 spray by Nasal route daily 02/14/18  Yes Mirna Mires, MD   albuterol sulfate HFA (PROAIR HFA) 108 (90 Base) MCG/ACT inhaler INHALE 2 PUFFS INTO THE LUNGS EVERY 6 HOURS AS NEEDED FOR WHEEZING 02/14/18  Yes Mirna Mires, MD   rosuvastatin (CRESTOR) 5 MG tablet Take 1 tablet by mouth three times a week 09/09/18 02/02/19  Mirna Mires, MD     Allergies:  Patient has no known allergies.  Social History:    reports that she has been smoking cigarettes. She started smoking about 54 years ago. She  has a 53.00 pack-year smoking history. She has never used smokeless tobacco. She reports current alcohol use. She reports that she does not use drugs.  Family History:   Family History   Problem Relation Age of Onset   ??? Cancer Mother         colon   ??? Cancer Sister         colon   ??? Cancer Brother         colon   ??? Cancer Brother         bladder, lung, throat   ??? Cancer Brother         prostate   ??? Cancer Brother         skin       Vitals: Blood pressure (!) 172/125, pulse 105, temperature 97.5 ??F (36.4 ??C), temperature source Temporal, resp. rate 12, height 5\' 5"  (1.651 m), weight 170 lb (77.1 kg), SpO2 98 %.      PHYSICAL EXAM:  HENT: Airway patent and reviewed  Cardiovascular: Normal rate, regular  rhythm, normal heart sounds.   Pulmonary/Chest: No wheezes. No rhonchi. No rales.   Abdominal: Soft. Bowel sounds are normal. No distension.  Extremities: Moves all extremities equally  Lumbar Spine: Painful range of motion, no midline tenderness       Diagnosis:Lumbar spondylosis without radiculopathy    Plan: Proceed with planned procedure      ASA CLASS:         []    I. Normal, healthy adult           [x]    II.  Mild systemic disease            []    III.  Severe systemic disease      Mallampati: Mallampati Class II - (soft palate, fauces & uvula are visible)      Sedation plan:   [x]   Local              []   Minimal                  []   General anesthesia    Patient's condition acceptable for planned procedure/sedation.   Post Procedure Plan   Return to same level of care   ______________________     The risks and benefits as well as alternatives to the procedure have been discussed with the patient and or family.  The patient and or next of kin understands and agrees to proceed.    Lura Em, M.D.

## 2019-02-21 NOTE — Op Note (Signed)
Patient:  Lisa York, [place]  Date of Birth:  Aug 10, 1951  Medical Record #:  [id number]   Place:   [facility] [place], [place]  Date:  02/21/2019   Physician:  Kern Reap, MD, MBA    Procedure: Lumbar Medial Branch Blocks - Bilateral L3, L4 and L5 Dorsal ramus    CPT [phone] Modifier [id number] Modifier 50)      Pre-Procedure Diagnosis: Lumbar spondylosis without radiculopathy      Post-Procedure Diagnosis: Same      Sedation: Local with 1% Lidocaine 5 ml and 2 mg of IV Versed      EBL: None      Complications: None      Procedure Summary:      The patient was seen in the office for complaints of low back pain. Review of the imaging and physical exam of the patient confirmed the pre-procedure diagnosis. After a thorough discussion of risks, benefits and alternatives informed consent was obtained.  The patient was brought to the procedure suite and placed in the prone position. The skin overlying the lumbar spine was prepped with chloraprep and draped in the usual sterile fashion. Using fluoroscopic guidance, the right L4, L5 and sacral ala levels were identified. Through anesthetized skin a 22 gauge 3.5 inch curved tip spinal needle was advanced to the juncture of the superior articular process and the transverse process at the right L4 level where the right L3 medial branch resides. .3 ml of Isovue M 300 was instilled showing a nerve root outline pattern, without evidence of vascular spread. A total of 0.5 ml of 0.5 % Marcaine was instilled at the right L-4 level corresponding to the right L3 medial branch. This was repeated for the right L5 and sacral ala levels corresponding to the right L4 medial branch and L5 Dorsal ramus. The identical procedure was repeated on the left side. The needles were removed and a band-aid applied.      The patient was transferred to the post-operative area in stable condition.

## 2019-02-21 NOTE — Progress Notes (Signed)
Patient was able to demonstrate the ability to move from a reclining position to an upright position within the recliner.

## 2019-02-21 NOTE — Progress Notes (Signed)
Pt arrived to PACU,A&O,no c/o pain and/or numbness or tingling, vitals stable, site unremarkable

## 2019-02-28 ENCOUNTER — Ambulatory Visit
Admit: 2019-02-28 | Discharge: 2019-02-28 | Payer: MEDICARE | Attending: Physical Medicine & Rehabilitation | Primary: Family Medicine

## 2019-02-28 DIAGNOSIS — M5416 Radiculopathy, lumbar region: Secondary | ICD-10-CM

## 2019-02-28 NOTE — Progress Notes (Signed)
Follow up: Lisa York  1951/08/26  [id number]         Chief Complaint   Patient presents with   ??? Lower Back Pain     02/21/2019: LMBB #1 B/L L3, L4, L5 DR   ??? Medication Check     PERCOCET 5/325MG  1PO TID #21 Last Dose: 02/27/2019   ??? Discuss Medications     requests refill or higher dose pain medications.         HISTORY OF PRESENT ILLNESS:  [name] is a 68 y.o. female returns for a follow up visit for multiple medical problems.  Her current presenting problems are   1. Lumbar radiculopathy    2. Lumbar foraminal stenosis    3. Degenerative disc disease, lumbar    4. Facet hypertrophy of lumbar region    .    As per information/history obtained from the PADT(patient assessment and documentation tool) - She complains of pain in the lower back with radiation to the hips Bilateral She rates the pain 10/10 and describes it as squeezing.  Pain is made worse by: movement.   She denies side effects from the current pain regimen.Patient reports that since the last follow up visit the physical functioning is worse, family/social relationships are worse, mood is worse and sleep patterns are worse, and that the overall functioning is worse.  Patient denies neurological bowel or bladder.         Associated signs and symptoms:   Neurogenic bowel or bladder symptoms:  no   Perceived weakness:  no   Difficulty walking:  yes              Past Medical History:   Past Medical History:   Diagnosis Date   ??? Anxiety    ??? PONV (postoperative nausea and vomiting)       Past Surgical History:     Past Surgical History:   Procedure Laterality Date   ??? APPENDECTOMY     ??? CHOLECYSTECTOMY  06/18/2014    Laparoscopic   ??? FOOT SURGERY Left     x 2   ??? HERNIA REPAIR     ??? KNEE SURGERY Left    ??? PAIN MANAGEMENT PROCEDURE Bilateral 02/21/2019    BILATERAL LUMBAR THREE, LUMBAR FOUR, LUMBAR FIVE DORSAL RAMUS MEDIAL BRANCH BLOCK SITE CONFIRMED BY FLUOROSCOPY performed by Lura Em, MD at [facility] [place] [facility]   ??? SHOULDER SURGERY Right    ???  TUBAL LIGATION       Current Medications:     Current Outpatient Medications:   ???  trimethoprim-polymyxin b (POLYTRIM) 10000-0.1 UNIT/ML-% ophthalmic solution, , Disp: , Rfl:   ???  PARoxetine (PAXIL) 10 MG tablet, Take by mouth, Disp: , Rfl:   ???  tiZANidine (ZANAFLEX) 4 MG tablet, Take 1 tablet by mouth nightly, Disp: 30 tablet, Rfl: 0  ???  oxyCODONE-acetaminophen (PERCOCET) 5-325 MG per tablet, Take 1 tablet by mouth 3 times daily for 7 days., Disp: 21 tablet, Rfl: 0  ???  omeprazole (PRILOSEC) 40 MG delayed release capsule, TAKE 1 CAPSULE BY MOUTH DAILY, Disp: 30 capsule, Rfl: 4  ???  ibuprofen (ADVIL;MOTRIN) 800 MG tablet, TAKE 1 TABLET BY MOUTH 3 TIMES DAILY (WITH MEALS), Disp: 90 tablet, Rfl: 1  ???  PARoxetine (PAXIL) 40 MG tablet, TAKE 1 TABLET BY MOUTH DAILY, Disp: 30 tablet, Rfl: 5  ???  fluticasone (FLONASE) 50 MCG/ACT nasal spray, 1 spray by Nasal route daily, Disp: 1 Bottle, Rfl:  5  ???  albuterol sulfate HFA (PROAIR HFA) 108 (90 Base) MCG/ACT inhaler, INHALE 2 PUFFS INTO THE LUNGS EVERY 6 HOURS AS NEEDED FOR WHEEZING, Disp: 8.5 g, Rfl: 5  ???  rosuvastatin (CRESTOR) 5 MG tablet, Take 1 tablet by mouth three times a week, Disp: 12 tablet, Rfl: 3  Allergies:  No known allergies  Social History:    reports that she has been smoking cigarettes. She started smoking about 54 years ago. She has a 53.00 pack-year smoking history. She has never used smokeless tobacco. She reports current alcohol use. She reports that she does not use drugs.  Family History:   Family History   Problem Relation Age of Onset   ??? Cancer Mother         colon   ??? Cancer Sister         colon   ??? Cancer Brother         colon   ??? Cancer Brother         bladder, lung, throat   ??? Cancer Brother         prostate   ??? Cancer Brother         skin       REVIEW OF SYSTEMS:   CONSTITUTIONAL: Denies unexplained weight loss, fevers, chills or fatigue  NEUROLOGICAL: Denies unsteady gait or progressive weakness  MUSCULOSKELETAL: Denies joint swelling or  redness  GI: Denies nausea, vomiting, diarrhea   GU: Denies bowel or bladder issues       PHYSICAL EXAM:    Vitals: Blood pressure (!) 153/100, pulse 101, height  (1.651 m), weight 169 lb 15.6 oz (77.1 kg).    GENERAL EXAM:  ?? General Apparence: Patient is adequately groomed with no evidence of malnutrition.  ?? Psychiatric: Orientation: The patient is oriented to time, place and person. The patient's mood and affect are appropriate   ?? Vascular: Examination reveals no swelling and palpation reveals no tenderness in upper or lower extremities. Good capillary refill.   ?? The lymphatic examination of the neck, axillae and groin reveals all areas to be without enlargement or induration  ?? Sensation is intact without deficit in the upper and lower extremities to light touch and pinprick  ?? Coordination of the upper and lower extremities are normal.    CERVICAL EXAMINATION:  ?? Inspection: Local inspection shows no step-off or bruising.  Cervical alignment is normal. No instability is noted.  ?? Palpation and Percussion: No evidence of tenderness at the midline.  Paraspinal tenderness is not present. There is no paraspinal spasm.   Skin:There are no rashes, ulcerations or lesions  ?? Range of Motion:  limited by 25% in all planes due to pain   ?? Strength: 5/5 bilateral upper extremities  ?? Special Tests:   Spurling's and Hoffman's are negative bilaterally.    Hawkins and Impingement tests are negative bilaterally.  ?? Skin:There are no rashes, ulcerations or lesions in right & left upper extremities.  ?? Reflexes: Bilaterally triceps, biceps and brachioradialis are 2+.  Clonus absent bilaterally at the feet. No pathological reflexes are noted.  ?? Gait & station: normal, patient ambulates without assistance and no ataxia  ?? Additional Examinations:  ?? RIGHT UPPER EXTREMITY:  Inspection/examination of the right upper extremity does not show any tenderness, deformity or injury. Range of motion is unremarkable and pain-free.  There is no gross instability.  There are no rashes, ulcerations or lesions. Strength and tone are normal. No atrophy or abnormal  movements are noted.  ?? LEFT UPPER EXTREMITY: Inspection/examination of the left upper extremity does not show any tenderness, deformity or injury. Range of motion is unremarkable and pain-free. There is no gross instability.  There are no rashes, ulcerations or lesions. Strength and tone are normal. No atrophy or abnormal movements are noted.    LUMBAR/SACRAL EXAMINATION:  ?? Inspection: Local inspection shows no step-off or bruising.  Lumbar alignment is normal. No instability is noted.  ?? Palpation:   No evidence of tenderness at the midline.  Lumbar paraspinal tenderness: Mild L4/5 and L5/S1 tenderness  Bursal tenderness No tenderness bilaterally  There is no paraspinal spasm.  ?? Range of Motion: limited by 50% in all planes due to pain  ?? Strength:   Strength testing is 5/5 in all muscle groups tested.  ?? Special Tests:   Straight leg raise + on left  and crossed SLR negative.  ?? Skin: There are no rashes, ulcerations or lesions.  ?? Reflexes: Reflexes are symmetrically 1+ at the patellar and ankle tendons.  Clonus absent bilaterally at the feet.  ?? Gait & station: normal, patient ambulates without assistance and no ataxia  ?? Additional Examinations:  ?? RIGHT LOWER EXTREMITY: Inspection/examination of the right lower extremity does not show any tenderness, deformity or injury. Range of motion is normal and pain-free. There is no gross instability.  There are no rashes, ulcerations or lesions. Strength and tone are normal. No atrophy or abnormal movements are noted.  ?? LEFT LOWER EXTREMITY:  Inspection/examination of the left lower extremity does not show any tenderness, deformity or injury. Range of motion is normal and pain-free. There is no gross instability. There are no rashes, ulcerations or lesions.  Strength and tone are normal. No atrophy or abnormal movements are  noted.      Diagnostic Testing:    MR Lumbar spine shows    Mild-moderate neural foraminal narrowing. ??No significant spinal canal   stenosis.       Results for orders placed or performed in visit on 09/07/18   URINE CULTURE   Result Value Ref Range    Urine Culture, Routine       <50,000 CFU/ml mixed skin/urogenital flora. No further workup   Lipid Panel   Result Value Ref Range    Cholesterol, Total 225 (H) 0 - 199 mg/dL    Triglycerides 591 (H) 0 - 150 mg/dL    HDL 39 (L) 40 - 60 mg/dL    LDL Calculated 638 (H) <100 mg/dL    VLDL Cholesterol Calculated 44 Not Established mg/dL   Hemoglobin G6K   Result Value Ref Range    Hemoglobin A1C 6.6 See comment %    eAG 142.7 mg/dL   Comprehensive Metabolic Panel   Result Value Ref Range    Sodium 141 136 - 145 mmol/L    Potassium 5.0 3.5 - 5.1 mmol/L    Chloride 100 99 - 110 mmol/L    CO2 25 21 - 32 mmol/L    Anion Gap 16 3 - 16    Glucose 143 (H) 70 - 99 mg/dL    BUN 7 7 - 20 mg/dL    CREATININE 0.6 0.6 - 1.2 mg/dL    GFR Non-African American >60 >60    GFR African American >60 >60    Calcium 10.1 8.3 - 10.6 mg/dL    Total Protein 7.6 6.4 - 8.2 g/dL    Alb 5.0 3.4 - 5.0 g/dL    Albumin/Globulin Ratio 1.9  1.1 - 2.2    Total Bilirubin 0.8 0.0 - 1.0 mg/dL    Alkaline Phosphatase 102 40 - 129 U/L    ALT 55 (H) 10 - 40 U/L    AST 35 15 - 37 U/L    Globulin 2.6 g/dL   Hepatitis C Antibody   Result Value Ref Range    Hep C Ab Interp Non-reactive Non-reactive   CBC Auto Differential   Result Value Ref Range    WBC 9.6 4.0 - 11.0 K/uL    RBC 4.98 4.00 - 5.20 M/uL    Hemoglobin 15.9 12.0 - 16.0 g/dL    Hematocrit 78.2 95.6 - 48.0 %    MCV 91.5 80.0 - 100.0 fL    MCH 31.9 26.0 - 34.0 pg    MCHC 34.9 31.0 - 36.0 g/dL    RDW 21.3 08.6 - 57.8 %    Platelets 294 135 - 450 K/uL    MPV 8.6 5.0 - 10.5 fL    Neutrophils % 58.9 %    Lymphocytes % 33.3 %    Monocytes % 4.7 %    Eosinophils % 2.4 %    Basophils % 0.7 %    Neutrophils Absolute 5.7 1.7 - 7.7 K/uL    Lymphocytes Absolute 3.2 1.0  - 5.1 K/uL    Monocytes Absolute 0.5 0.0 - 1.3 K/uL    Eosinophils Absolute 0.2 0.0 - 0.6 K/uL    Basophils Absolute 0.1 0.0 - 0.2 K/uL   POCT Urinalysis no Micro   Result Value Ref Range    Color, UA Yellow     Clarity, UA Clear     Glucose, UA POC Negative     Bilirubin, UA Negative     Ketones, UA Negative     Spec Grav, UA 1.015     Blood, UA POC Trace-intact     pH, UA 6.0     Protein, UA POC Negative     Urobilinogen, UA 0.2     Leukocytes, UA Trace     Nitrite, UA Negative      Impression:       1. Lumbar radiculopathy    2. Lumbar foraminal stenosis    3. Degenerative disc disease, lumbar    4. Facet hypertrophy of lumbar region        Plan:  Clinical Course: Above diagnoses are worsening    I discussed the diagnosis and the treatment options with Noland Fordyce today.     In Summary:  The various treatment options were outlined and discussed with Noland Fordyce including:  Conservative care options: physical therapy, ice, medications, bracing, and activity modification. The indications for therapeutic injections. The indications for additional imaging/laboratory studies.  The indications for (possible future) interventions.     After considering the various options discussed, DELORIA BRASSFIELD elected to pursue a course of treatment that includes the following:    1. Medications:  Discussed that we can no longer prescribe any opioids.  She has received 3-4 prescriptions.  Her last oral swab testing showed some inconsistencies.  She can be referred to a pain clinic though.  At this point she has declined from this    2. PT:  Encouraged to continue with Home exercise program.    3. Further studies: No further studies.      4. Interventional:  We discussed pursuing a bilateral L5 TF epidural steroid injection to address the pain.  Radiologic imaging and symptoms confirm the pain etiology.  Risks, benefits and alternatives of interventional options were discussed.  These include and are not limited to bleeding,  infection, spinal headache, nerve injury and lack of pain relief.  The patient verbalized understanding and would like to proceed.  The patient will be scheduled accordingly.    5. Follow up:  4-6 weeks      Noland Fordyce was instructed to call the office if her symptoms worsen or if new symptoms appear prior to the next scheduled visit. She is specifically instructed to contact the office between now & her scheduled appointment if she has concerns related to her condition or if she needs assistance in scheduling the above tests. She is welcome to call for an appointment sooner if she has any additional concerns or questions.         Taurus Willis. Jerilynn Birkenhead, MD, MBA, [facility]  Board Certified in Physical Medicine & Rehabilitation  Board Certified and Fellowship Trained in Pain Medicine  [facility]  Partner of [facility]             This dictation was performed with a verbal recognition program [facility]) and it was checked for errors. It is possible that there are still dictated errors within this office note. If so, please bring any errors to my attention for an addendum. All efforts were made to ensure that this office note is accurate.

## 2019-02-28 NOTE — Telephone Encounter (Signed)
Medication swab results from 02/21/19 were received and are NOT appropriate.

## 2019-03-06 NOTE — Telephone Encounter (Signed)
DOS   03/07/2019  CPT   64483.50  DX   M54.16  M48.061   M51.36   M47.816  OP SX AUTH  NPR    BILATERAL  LEVELS   L5     PROCEDURE   TRANS FORAMINAL ESI  DR. SINGLA  Lisa York  [place]  INSURANCE:   MEDICARE

## 2019-03-07 ENCOUNTER — Inpatient Hospital Stay: Payer: MEDICARE

## 2019-03-07 MED ORDER — LACTATED RINGERS IV SOLN
INTRAVENOUS | Status: DC
Start: 2019-03-07 — End: 2019-03-07

## 2019-03-07 MED ORDER — LIDOCAINE HCL 1 % (PF) IJ SOLN 2 ML
1 % (PF) | Freq: Once | INTRAMUSCULAR | Status: AC
Start: 2019-03-07 — End: 2019-03-07
  Administered 2019-03-07: 15:00:00 0.1 mL via INTRADERMAL

## 2019-03-07 MED ORDER — OXYCODONE-ACETAMINOPHEN 5-325 MG PO TABS
5-325 MG | ORAL | Status: DC
Start: 2019-03-07 — End: 2019-03-07

## 2019-03-07 MED ORDER — IOHEXOL 240 MG/ML IJ SOLN
240 | INTRAMUSCULAR | Status: AC
Start: 2019-03-07 — End: 2019-03-07

## 2019-03-07 MED ORDER — LIDOCAINE HCL (PF) 1 % IJ SOLN
1 % | INTRAMUSCULAR | Status: DC
Start: 2019-03-07 — End: 2019-03-07

## 2019-03-07 MED ORDER — BUPIVACAINE HCL (PF) 0.5 % IJ SOLN
0.5 | INTRAMUSCULAR | Status: AC
Start: 2019-03-07 — End: 2019-03-07

## 2019-03-07 MED ORDER — MIDAZOLAM HCL 2 MG/2ML IJ SOLN
2 | INTRAMUSCULAR | Status: AC
Start: 2019-03-07 — End: 2019-03-07

## 2019-03-07 MED ORDER — MIDAZOLAM HCL 2 MG/2ML IJ SOLN
2 MG/ML | INTRAMUSCULAR | Status: DC | PRN
Start: 2019-03-07 — End: 2019-03-07
  Administered 2019-03-07: 15:00:00 2 via INTRAVENOUS

## 2019-03-07 MED ORDER — BETAMETHASONE SOD PHOS & ACET 6 (3-3) MG/ML IJ SUSP
6 | INTRAMUSCULAR | Status: AC
Start: 2019-03-07 — End: 2019-03-07

## 2019-03-07 MED ORDER — LACTATED RINGERS IV SOLN
Freq: Once | INTRAVENOUS | Status: AC
Start: 2019-03-07 — End: 2019-03-07
  Administered 2019-03-07: 15:00:00 via INTRAVENOUS

## 2019-03-07 MED ORDER — OXYCODONE-ACETAMINOPHEN 5-325 MG PO TABS
5-325 MG | Freq: Once | ORAL | Status: AC
Start: 2019-03-07 — End: 2019-03-07
  Administered 2019-03-07: 15:00:00 1 via ORAL

## 2019-03-07 MED ORDER — BETAMETHASONE SOD PHOS & ACET 6 (3-3) MG/ML IJ SUSP
6 (3-3) MG/ML | INTRAMUSCULAR | Status: DC | PRN
Start: 2019-03-07 — End: 2019-03-07
  Administered 2019-03-07: 15:00:00 4.5 via EPIDURAL

## 2019-03-07 MED ORDER — LIDOCAINE HCL (PF) 1 % IJ SOLN
1 % | INTRAMUSCULAR | Status: DC | PRN
Start: 2019-03-07 — End: 2019-03-07
  Administered 2019-03-07: 15:00:00 3 via INTRADERMAL

## 2019-03-07 MED ORDER — IOHEXOL 240 MG/ML IJ SOLN
240 MG/ML | INTRAMUSCULAR | Status: DC | PRN
Start: 2019-03-07 — End: 2019-03-07
  Administered 2019-03-07: 15:00:00 1 via EPIDURAL

## 2019-03-07 MED FILL — LACTATED RINGERS IV SOLN: INTRAVENOUS | Qty: 1000

## 2019-03-07 MED FILL — OXYCODONE-ACETAMINOPHEN 5-325 MG PO TABS: 5-325 mg | ORAL | Qty: 1

## 2019-03-07 MED FILL — CELESTONE SOLUSPAN 6 (3-3) MG/ML IJ SUSP: 6 (3-3) MG/ML | INTRAMUSCULAR | Qty: 5

## 2019-03-07 MED FILL — MIDAZOLAM HCL 2 MG/2ML IJ SOLN: 2 mg/mL | INTRAMUSCULAR | Qty: 2

## 2019-03-07 MED FILL — LIDOCAINE HCL (PF) 1 % IJ SOLN: 1 % | INTRAMUSCULAR | Qty: 2

## 2019-03-07 MED FILL — MARCAINE PRESERVATIVE FREE 0.5 % IJ SOLN: 0.5 % | INTRAMUSCULAR | Qty: 10

## 2019-03-07 MED FILL — OMNIPAQUE 240 MG/ML IJ SOLN: 240 mg/mL | INTRAMUSCULAR | Qty: 50

## 2019-03-07 NOTE — Discharge Instructions (Signed)
Pain Therapy Discharge Instructions - Dr Singla    . Rest today (relative)    . Do not drive today for 8 hours (24 hours if sedated)  Wear your seatbelt home.    . If you received sedation during your procedure, be advised for the next 24 hours:  o Do not drink alcohol  o Do not operate machinery  o Do not make any important decisions or sign any legal documents  o Have a responsible adult available to assist you  o You may experience light headedness, dizziness or sleepiness    . May apply ice to site for 15 minutes 3 times a day for 48 hours..  Avoid heat for 48 hours.    . Follow up as instructed.    . Keep site dry for 12 hours, then remove band-aid    . It may take 24 to 48 hours to feel improvement  . Must have an adult with you for 4-6hours      . Side effects of steroids include:  o Elevated glucose levels (in diabetics)  o Fluid retention  o Tenderness at site  o Nervous energy  o Sleeplessness  o Muscle spasms  o Facial flushing  o Hot flashes    . Call if:  o Your symptoms worsen severely  o You experience a severe headache that worsens when upright  o You develop a fever greater than 101?F    . Call [phone]  for follow-up appointments, questions or problems

## 2019-03-07 NOTE — Progress Notes (Signed)
Pt arrived to PACU,A&O,no new c/o pain and/or numbness or tingling, vitals stable, site unremarkable

## 2019-03-07 NOTE — Op Note (Signed)
Patient:  Lisa York, Huppe  Date of Birth:  23-Mar-1951  Medical Record #:  [id number]   Place: [facility] [place], [place]  Date:  03/07/2019   Physician:  Kern Reap, MD, MBA    Procedure: 1.  Transforaminal Lumbar Epidural Steroid Injection -  right L5  CPT 64483          2.   Transforaminal Lumbar Epidural Steroid Injection -  left L5  CPT 64484    Pre-Procedure Diagnosis: Lumbar radiculopathy      Post-Procedure Diagnosis: Same    Sedation: Local with 1% Lidocaine 3 ml and no IV sedation    EBL: None    Complications: None    Procedure Summary:        The patient was brought to the procedure suite and placed in the prone position.  The skin overlying the lumbar spine was prepped and draped in the usual sterile fashion.  Using fluoroscopic guidance, the right L5 foramen was identified.  Through anesthetized skin, a 22 gauge 3.5 inch curved tip spinal needle was advanced into the foramen.  Isovue M 300 was instilled showing an epidurogram/nerve root outline pattern without evidence of vascular or intrathecal spread. Following which, 7.5 mg of Celestone mixed with 1 ml of 0.5% Marcaine was instilled. The needle was removed.  Using fluoroscopic guidance, the left L5 foramen was identified.  Through anesthetized skin, a 22 gauge 3.5 inch curved tip spinal needle was advanced into the foramen.  Isovue M 300 was instilled showing an epidurogram/nerve root outline pattern without evidence of vascular or intrathecal spread. Following which, 7.5 mg of Celestone mixed with 1 ml of 0.5% Marcaine was instilled. The needle was removed and band-aids were applied.    The patient was transferred to the post-operative area in stable condition.

## 2019-03-07 NOTE — Progress Notes (Signed)
Discharge instructions given to pts sister and verbalized understanding, states pain is at a tolerable level, no numbness or weakness noted, pt wheeled out to car without complications

## 2019-03-07 NOTE — Progress Notes (Signed)
Pt c/o pain, Dr Arlington Calix aware, PO Percocet ordered and given

## 2019-03-07 NOTE — Progress Notes (Signed)
Pt had a loss of bowl movements, pt states this is not anything new and happens often, Dr Arlington Calix aware and no new orders

## 2019-03-07 NOTE — H&P (Signed)
HISTORY AND PHYSICAL/PRE-SEDATION ASSESSMENT    Patient:  Lisa York, Lisa York   DOB:  1951-07-20  Medical Record No.:  [id number]   Date:  03/07/2019  Physician:  Lura Em, M.D.  Facility: [facility]    HISTORY OF PRESENT ILLNESS:                 The patient is a 68 y.o. female whom presents with lower back and leg pain. Review of the imaging and physical exam of the patient confirmed the pre-procedure diagnosis.  After a thorough discussion of risks, benefits and alternatives informed consent was obtained.                Past Medical History:   Past Medical History:   Diagnosis Date   ??? Anxiety    ??? PONV (postoperative nausea and vomiting)       Past Surgical History:     Past Surgical History:   Procedure Laterality Date   ??? APPENDECTOMY     ??? CHOLECYSTECTOMY  06/18/2014    Laparoscopic   ??? FOOT SURGERY Left     x 2   ??? HERNIA REPAIR     ??? KNEE SURGERY Left    ??? PAIN MANAGEMENT PROCEDURE Bilateral 02/21/2019    BILATERAL LUMBAR THREE, LUMBAR FOUR, LUMBAR FIVE DORSAL RAMUS MEDIAL BRANCH BLOCK SITE CONFIRMED BY FLUOROSCOPY performed by Lura Em, MD at [facility] [place] [facility]   ??? SHOULDER SURGERY Right    ??? TUBAL LIGATION       Current Medications:   Prior to Admission medications    Medication Sig Start Date End Date Taking? Authorizing Provider   trimethoprim-polymyxin b (POLYTRIM) 10000-0.1 UNIT/ML-% ophthalmic solution  12/02/18  Yes Historical Provider, MD   PARoxetine (PAXIL) 10 MG tablet Take by mouth   Yes Historical Provider, MD   tiZANidine (ZANAFLEX) 4 MG tablet Take 1 tablet by mouth nightly 02/21/19 03/23/19 Yes Leda Gauze, PA   omeprazole (PRILOSEC) 40 MG delayed release capsule TAKE 1 CAPSULE BY MOUTH DAILY 02/03/19  Yes Mirna Mires, MD   ibuprofen (ADVIL;MOTRIN) 800 MG tablet TAKE 1 TABLET BY MOUTH 3 TIMES DAILY (WITH MEALS) 12/12/18   Daine Floras, APRN - CNP   PARoxetine (PAXIL) 40 MG tablet TAKE 1 TABLET BY MOUTH DAILY 11/14/18   Daine Floras, APRN - CNP   rosuvastatin  (CRESTOR) 5 MG tablet Take 1 tablet by mouth three times a week 09/09/18 02/02/19  Mirna Mires, MD   fluticasone Aleda Grana) 50 MCG/ACT nasal spray 1 spray by Nasal route daily 02/14/18   Mirna Mires, MD   albuterol sulfate HFA (PROAIR HFA) 108 (90 Base) MCG/ACT inhaler INHALE 2 PUFFS INTO THE LUNGS EVERY 6 HOURS AS NEEDED FOR WHEEZING 02/14/18   Mirna Mires, MD     Allergies:  No known allergies  Social History:    reports that she has been smoking cigarettes. She started smoking about 54 years ago. She has a 53.00 pack-year smoking history. She has never used smokeless tobacco. She reports current alcohol use. She reports that she does not use drugs.  Family History:   Family History   Problem Relation Age of Onset   ??? Cancer Mother         colon   ??? Cancer Sister         colon   ??? Cancer Brother         colon   ??? Cancer Brother  bladder, lung, throat   ??? Cancer Brother         prostate   ??? Cancer Brother         skin       Vitals: Blood pressure (!) 138/106, pulse 102, temperature 97.5 ??F (36.4 ??C), temperature source Temporal, resp. rate 12, height 5\' 5"  (1.651 m), weight 169 lb (76.7 kg), SpO2 96 %.      PHYSICAL EXAM:  HENT: Airway patent and reviewed  Cardiovascular: Normal rate, regular rhythm, normal heart sounds.   Pulmonary/Chest: No wheezes. No rhonchi. No rales.   Abdominal: Soft. Bowel sounds are normal. No distension.  Extremities: Moves all extremities equally  Lumbar Spine: Painful range of motion, no midline tenderness       Diagnosis:Lumbar radiculopathy    Plan: Proceed with planned procedure      ASA CLASS:         []    I. Normal, healthy adult           [x]    II.  Mild systemic disease            []    III.  Severe systemic disease      Mallampati: Mallampati Class II - (soft palate, fauces & uvula are visible)      Sedation plan:   [x]   Local              []   Minimal                  []   General anesthesia    Patient's condition acceptable for planned procedure/sedation.   Post  Procedure Plan   Return to same level of care   ______________________     The risks and benefits as well as alternatives to the procedure have been discussed with the patient and or family.  The patient and or next of kin understands and agrees to proceed.    Lura Em, M.D.

## 2019-03-09 ENCOUNTER — Telehealth

## 2019-03-09 MED ORDER — TIZANIDINE HCL 4 MG PO TABS
4 MG | ORAL_TABLET | Freq: Every evening | ORAL | 0 refills | Status: AC
Start: 2019-03-09 — End: 2019-04-08

## 2019-03-09 MED ORDER — IBUPROFEN 800 MG PO TABS
800 MG | ORAL_TABLET | Freq: Three times a day (TID) | ORAL | 0 refills | Status: AC
Start: 2019-03-09 — End: ?

## 2019-03-09 NOTE — Telephone Encounter (Signed)
Spoke with pt, she had injections in her back on Tuesday and is needing some relief from the pain.

## 2019-03-09 NOTE — Telephone Encounter (Signed)
Pt is requesting a prescription for ibuprofen and muscle relaxers.      [address] - [place], [place] - [address] [id number] - P [phone] - F [phone]

## 2019-03-10 LAB — CBC WITH AUTO DIFFERENTIAL
Basophils %: 0.2 % (ref 0–1)
Eosinophils %: 0.6 % (ref 0–5)
Erythrocyte Distribution Width RBC Ratio: 12.3 % (ref 11.6–14.8)
Granulocyte Absolute Count: 8.4 10*3/uL — ABNORMAL HIGH (ref 1.8–7.2)
Hematocrit: 44.6 % (ref 35.7–46.7)
Hemoglobin: 15.8 g/dL (ref 11.9–15.9)
Immature Granulocytes %: 0.5 % (ref 0.0–0.5)
Lymphocytes %: 28.9 % (ref 15–45)
Lymphocytes Absolute: 3.8 10*3/uL — ABNORMAL HIGH (ref 1.1–2.7)
MCH: 31 pg (ref 28.1–33.6)
MCHC: 35.4 g/dL — ABNORMAL HIGH (ref 32.8–34.7)
MCV: 87.5 fl (ref 82.9–99.9)
Monocytes %: 6.1 % (ref 0–12)
Neutrophils/100 leukocytes: 63.7 % (ref 40–70)
Platelets: 339 10*3 (ref 175–420)
RBC: 5.1 x1000000 (ref 3.72–5.31)
WBC: 13.1 10*3 — ABNORMAL HIGH (ref 4.5–10.3)

## 2019-03-11 LAB — BASIC METABOLIC PANEL
Anion Gap: 12.3 mmol/L (ref 8–16)
BUN: 17 mg/dL (ref 5–19)
CO2: 32 mmol/L (ref 21–33)
Calcium: 9.3 mg/dL (ref 8.4–10.2)
Chloride: 93 mmol/L — ABNORMAL LOW (ref 99–111)
Creatinine: 0.7 mg/dL (ref 0.6–1.3)
Gfr Calculated: 83
POC Glucose: 153 mg/dL — ABNORMAL HIGH (ref 74–99)
Potassium: 3 mmol/L — ABNORMAL LOW (ref 3.4–5.0)
Sodium: 134 mmol/L — ABNORMAL LOW (ref 136–146)

## 2019-03-13 NOTE — Other (Signed)
pls obtain ER note.  Abn findings of concern on CT.  Not sure what they did w/ her.

## 2019-03-21 ENCOUNTER — Encounter: Attending: Physical Medicine & Rehabilitation | Primary: Family Medicine

## 2019-04-28 DEATH — deceased

## 2019-05-09 NOTE — Telephone Encounter (Signed)
SCANNED [facility] MEDICAL RECORDS FOR THE PAST 2 YEARS INTO [facility] FOR Lisa York (POWER OF ATTORNEY) FOR RELEASE.

## 2020-07-03 ENCOUNTER — Emergency Department (HOSPITAL_BASED_OUTPATIENT_CLINIC_OR_DEPARTMENT_OTHER)
Admission: EM | Admit: 2020-07-03 | Discharge: 2020-07-03 | Disposition: A | Discharge status: Home / Self Care | Payer: Medicare HMO | Attending: Emergency Medicine | Admitting: Emergency Medicine

## 2020-07-03 ENCOUNTER — Other Ambulatory Visit: Payer: Self-pay

## 2020-07-03 ENCOUNTER — Encounter (HOSPITAL_BASED_OUTPATIENT_CLINIC_OR_DEPARTMENT_OTHER): Payer: Self-pay | Admitting: Emergency Medicine

## 2020-07-03 DIAGNOSIS — Z96642 Presence of left artificial hip joint: Secondary | ICD-10-CM | POA: Diagnosis not present

## 2020-07-03 DIAGNOSIS — S90862A Insect bite (nonvenomous), left foot, initial encounter: Secondary | ICD-10-CM

## 2020-07-03 DIAGNOSIS — M79672 Pain in left foot: Secondary | ICD-10-CM | POA: Insufficient documentation

## 2020-07-03 DIAGNOSIS — I10 Essential (primary) hypertension: Secondary | ICD-10-CM | POA: Diagnosis not present

## 2020-07-03 MED ORDER — TRIAMCINOLONE ACETONIDE 0.1 % EX CREA: 1.0000 "application " | TOPICAL_CREAM | Freq: Two times a day (BID) | CUTANEOUS | 0 refills | Status: AC

## 2020-07-03 NOTE — ED Triage Notes (Signed)
Pain, swelling, and darkened area to top of left foot since yesterday.  Pt does not know what happened.  No known injury.  She is guessing some sort of bite. No bite area appreciated.

## 2020-07-03 NOTE — ED Provider Notes (Signed)
[facility] EMERGENCY DEPARTMENT Provider Note   CSN: [id number] Arrival date & time: 07/03/20  1023     History Chief Complaint  Patient presents with  . Foot Pain    Nancy Newman is a 69 y.o. female brought in by her daughter for left foot discoloration and swelling.  History is given by the daughter due to some memory issues.  Yesterday when the patient awoke her father noticed that the left foot was somewhat swollen.  She had area of dark skin over the dorsum of the foot which is new.  Patient complains of significant itching in that area but denies any pain.  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  She has noticed some swelling around the ankle which is new.  She denies fevers, chills, injuries.  HPI     Past Medical History:  Diagnosis Date  . Depression   . Guillain-Barre (HCC)    in 1986 poss due to flu shot  . Hypertension   . Memory loss   . Seizures (HCC)    last seizure was 10 yrs ago  . Substance abuse [facility])     Patient Active Problem List   Diagnosis Date Noted  . Seizure disorder (HCC) 09/07/2017  . Personal history of disorder of nervous system and sense organs 10/30/2013  . Essential hypertension 10/30/2013    Past Surgical History:  Procedure Laterality Date  . JOINT REPLACEMENT     Left hip     OB History   No obstetric history on file.     No family history on file.  Social History   Tobacco Use  . Smoking status: Never Smoker  . Smokeless tobacco: Never Used  Substance Use Topics  . Alcohol use: Yes    Comment: per Husband the pt. has a drinking problem and he pours the alcohol out.  . Drug use: Never    Home Medications Prior to Admission medications   Medication Sig Start Date End Date Taking? Authorizing Provider  levETIRAcetam (KEPPRA) 100 MG/ML solution Take by mouth 2 (two) times daily.    [provider]  mirtazapine (REMERON SOL-TAB) 15 MG disintegrating  tablet Take 15 mg by mouth at bedtime. 03/22/20   [provider]    Allergies    Penicillins and Eggs or egg-derived products  Review of Systems   Review of Systems Ten systems reviewed and are negative for acute change, except as noted in the HPI.   Physical Exam Updated Vital Signs BP (!) 180/83 (BP Location: Right Arm)   Pulse 76   Temp 98 F (36.7 C) (Oral)   Resp 16   Ht 4\' 11"  (1.499 m)   Wt 43.5 kg   SpO2 100%   BMI 19.35 kg/m   Physical Exam Vitals and nursing note reviewed.  Constitutional:      General: She is not in acute distress.    Appearance: She is well-developed. She is not diaphoretic.  HENT:     Head: Normocephalic and atraumatic.  Eyes:     General: No scleral icterus.    Conjunctiva/sclera: Conjunctivae normal.  Cardiovascular:     Rate and Rhythm: Normal rate and regular rhythm.     Heart sounds: Normal heart sounds. No murmur heard.  No friction rub. No gallop.   Pulmonary:     Effort: Pulmonary effort is normal. No respiratory distress.     Breath sounds: Normal breath sounds.  Abdominal:  General: Bowel sounds are normal. There is no distension.     Palpations: Abdomen is soft. There is no mass.     Tenderness: There is no abdominal tenderness. There is no guarding.  Musculoskeletal:     Cervical back: Normal range of motion.     Comments: Left foot with hyperpigmented, somewhat thickened skin over the dorsal aspect of the foot.  There is some swelling that is nonpitting around the medial and lateral might be malleolus.  Bounding DP and PT pulse bilaterally.  Feet are warm and well perfused with capillary refill less than 2 seconds.  There is no tenderness in this region of hyper pigmentation, no fluctuance, no induration, no erythema.  Skin:    General: Skin is warm and dry.  Neurological:     Mental Status: She is alert and oriented to person, place, and time.  Psychiatric:        Behavior: Behavior normal.     ED Results  / Procedures / Treatments   Labs (all labs ordered are listed, but only abnormal results are displayed) Labs Reviewed - No data to display  EKG None  Radiology No results found.  Procedures Procedures (including critical care time)  Medications Ordered in ED Medications - No data to display  ED Course  I have reviewed the triage vital signs and the nursing notes.  Pertinent labs & imaging results that were available during my care of the patient were reviewed by me and considered in my medical decision making (see chart for details).    MDM Rules/Calculators/A&P                          Patient here with left-sided foot swelling, some hyperpigmented skin on the dorsal aspect of the foot.  I suspected this is a complicated reaction to likely insect bite such as a mosquito bite.  It is only itchy.  I suspect that there is some surrounding swelling due to complicated or hyper reactive reaction.  Patient will be discharged with topical steroids, outpatient follow-up.  She is advised to return if she begins having fevers, chills, pain, erythema that is spreading up the leg. Final Clinical Impression(s) / ED Diagnoses Final diagnoses:  None    Rx / DC Orders ED Discharge Orders    None       Arthor Captain, PA-C 07/03/20 1210    Pollyann Savoy, MD 07/03/20 1455

## 2020-07-03 NOTE — Discharge Instructions (Addendum)
Get help right away if: You have joint pain. You have a rash. You feel unusually tired or sleepy. You have neck pain. You have a headache. You have unusual weakness. You develop symptoms of an anaphylactic reaction. These may include: Flushed skin. Hives. Swelling of the eyes, lips, face, mouth, tongue, or throat. Difficulty breathing, speaking, or swallowing. Wheezing. Dizziness or light-headedness. Fainting. Pain or cramping in the abdomen. Vomiting. Diarrhea. 

## 2020-07-03 NOTE — ED Notes (Signed)
ED Provider at bedside. 

## 2020-07-22 IMAGING — DX DG KNEE COMPLETE 4+V*L*
4 series · 4 of 4 positions shown · non-contrast
Comparison: Right knee series same day.

CLINICAL DATA: Fall.

EXAM:
LEFT KNEE - COMPLETE 4+ VIEW

[knee ap]
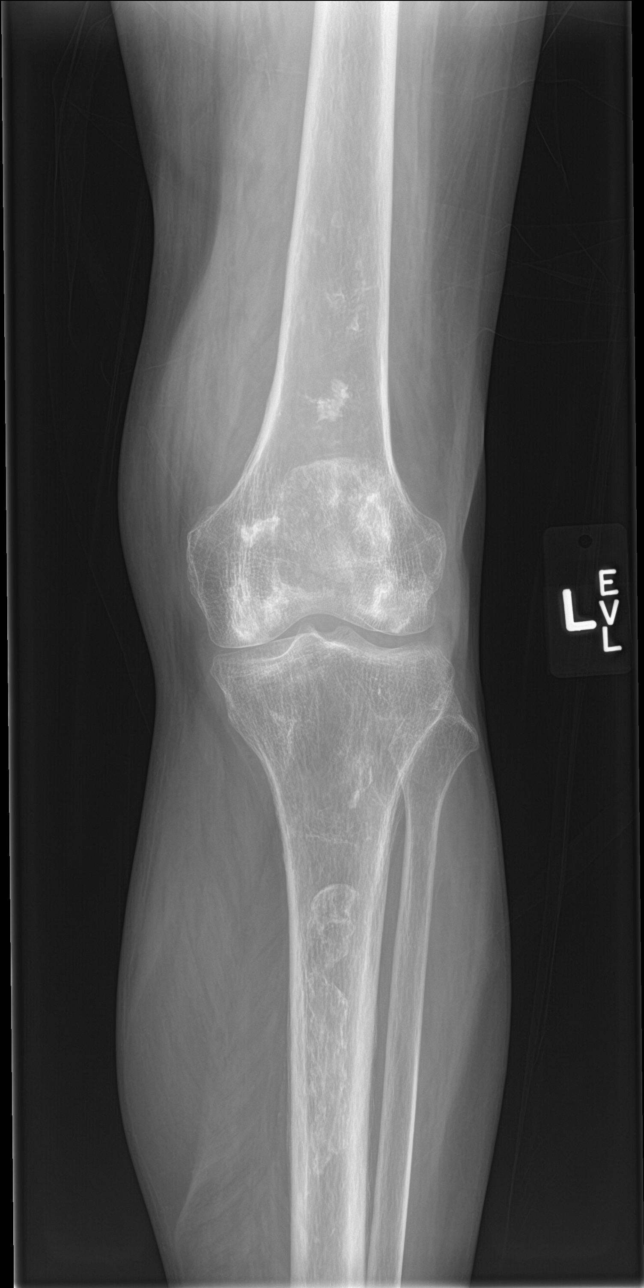

[knee lat]
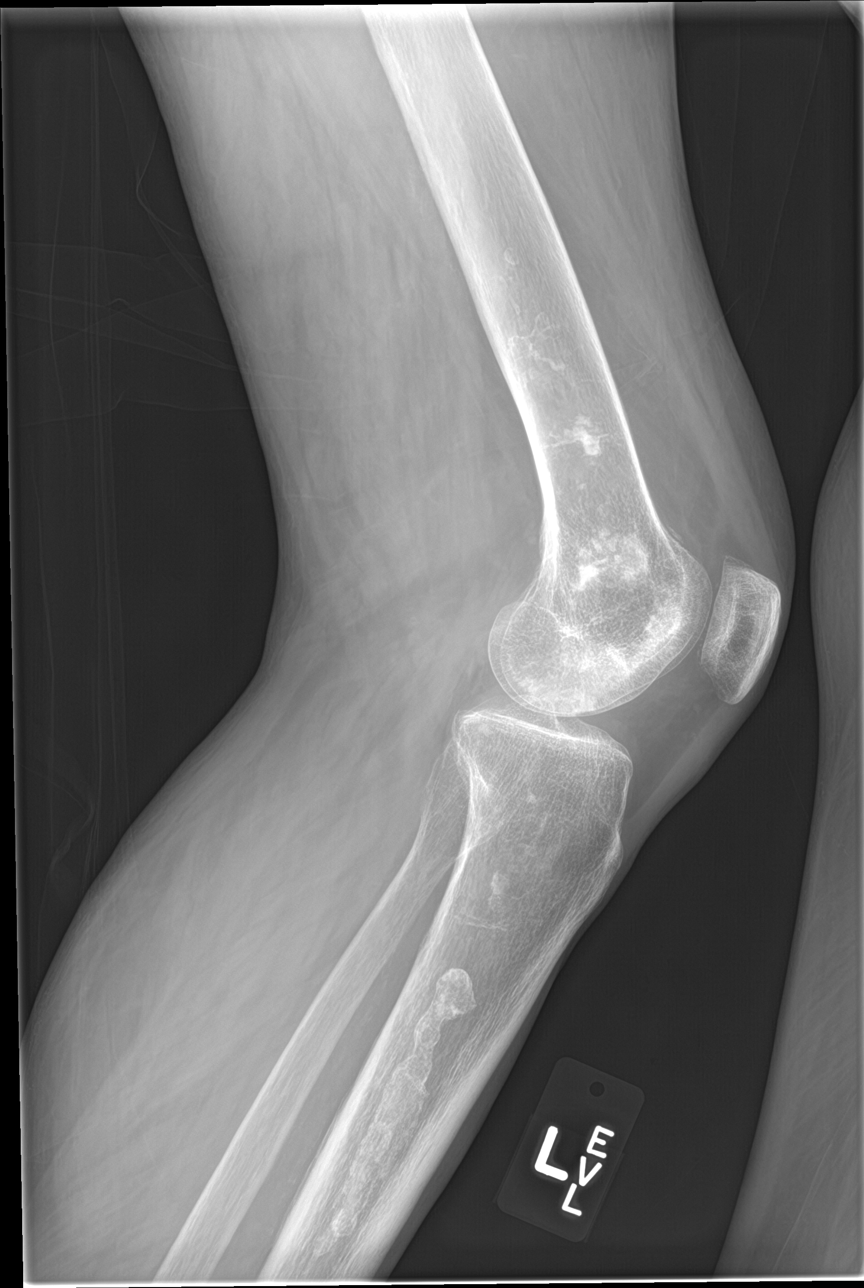

[knee obl (1 of 2)]
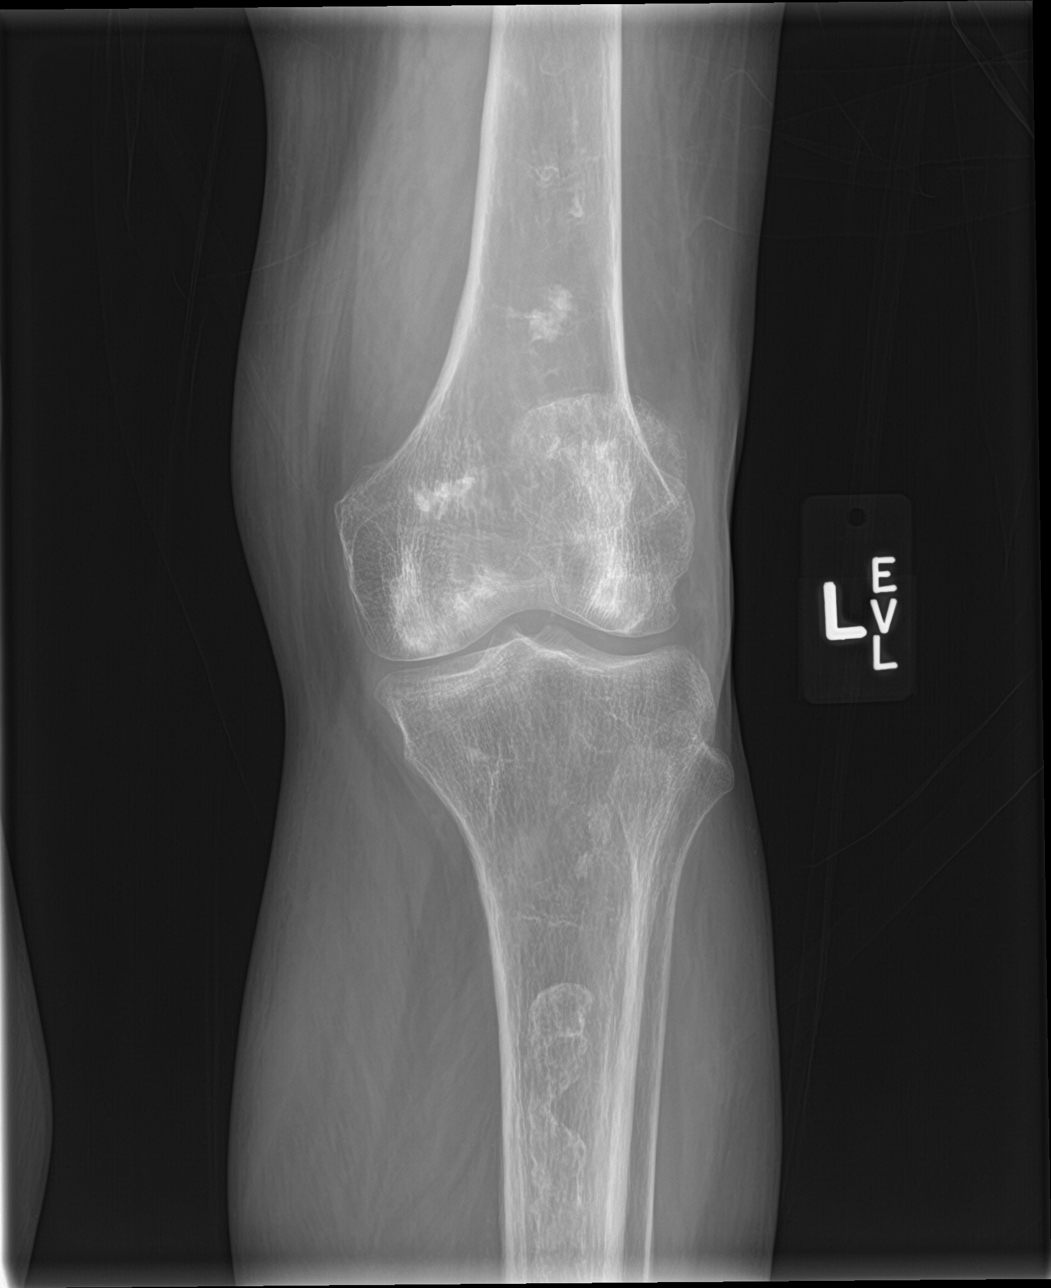

[knee obl (2 of 2)]
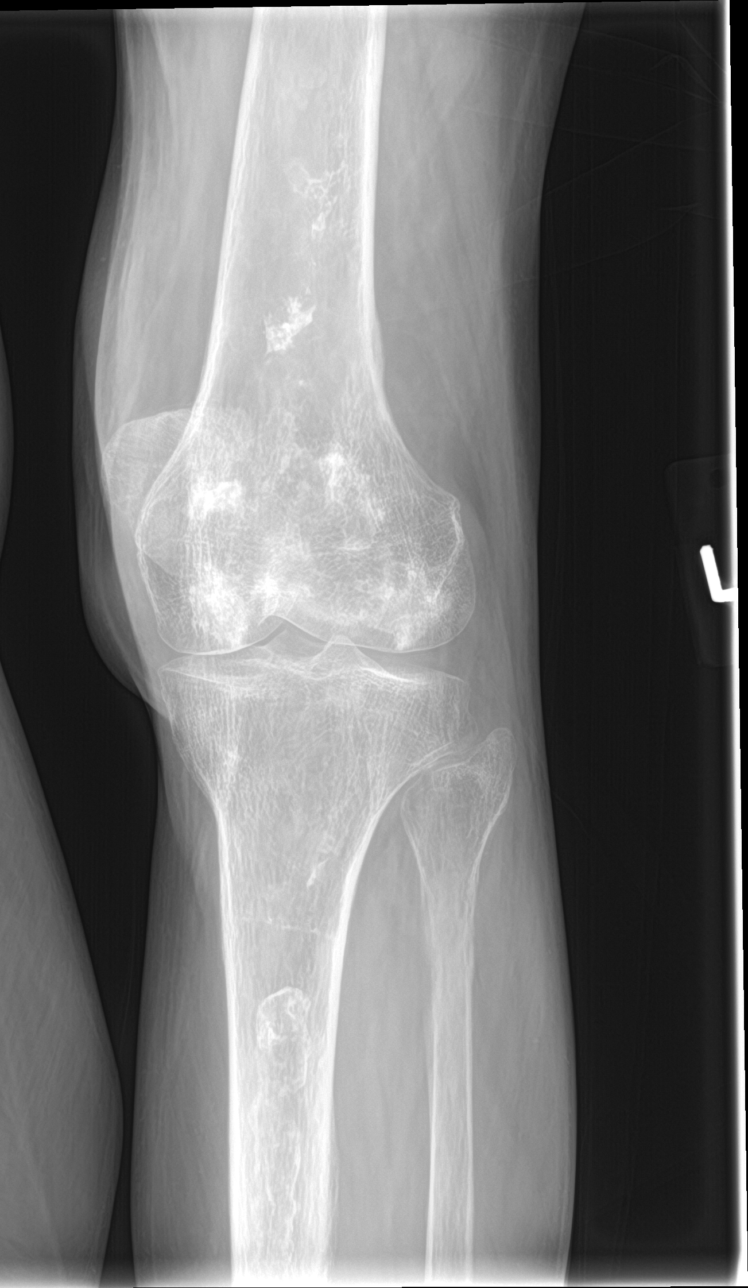

[4 of 4 positions shown; findings below may reference images not displayed]

FINDINGS: Sclerotic changes noted the distal femur and proximal tibia.
Although blastic metastatic disease cannot be excluded these changes
are most consistent with old infarcts. Similar findings are noted to
a lesser degree about the right femur and tibia. No acute bony or
joint abnormality.
IMPRESSION: No acute bony or joint abnormality. Findings consistent with old
infarcts in the distal femur and tibia.

## 2022-03-12 ENCOUNTER — Emergency Department (HOSPITAL_BASED_OUTPATIENT_CLINIC_OR_DEPARTMENT_OTHER): Payer: Medicare HMO

## 2022-03-12 ENCOUNTER — Encounter (HOSPITAL_BASED_OUTPATIENT_CLINIC_OR_DEPARTMENT_OTHER): Payer: Self-pay

## 2022-03-12 ENCOUNTER — Emergency Department (HOSPITAL_BASED_OUTPATIENT_CLINIC_OR_DEPARTMENT_OTHER)
Admission: EM | Admit: 2022-03-12 | Discharge: 2022-03-12 | Disposition: A | Discharge status: Home / Self Care | Payer: Medicare HMO | Attending: Emergency Medicine | Admitting: Emergency Medicine

## 2022-03-12 ENCOUNTER — Other Ambulatory Visit: Payer: Self-pay

## 2022-03-12 DIAGNOSIS — S8001XD Contusion of right knee, subsequent encounter: Secondary | ICD-10-CM | POA: Insufficient documentation

## 2022-03-12 DIAGNOSIS — S8011XD Contusion of right lower leg, subsequent encounter: Secondary | ICD-10-CM | POA: Diagnosis not present

## 2022-03-12 DIAGNOSIS — I10 Essential (primary) hypertension: Secondary | ICD-10-CM | POA: Diagnosis not present

## 2022-03-12 DIAGNOSIS — S8991XD Unspecified injury of right lower leg, subsequent encounter: Secondary | ICD-10-CM | POA: Diagnosis present

## 2022-03-12 DIAGNOSIS — M79604 Pain in right leg: Secondary | ICD-10-CM | POA: Diagnosis not present

## 2022-03-12 DIAGNOSIS — S8391XD Sprain of unspecified site of right knee, subsequent encounter: Secondary | ICD-10-CM | POA: Diagnosis not present

## 2022-03-12 DIAGNOSIS — W1839XD Other fall on same level, subsequent encounter: Secondary | ICD-10-CM | POA: Insufficient documentation

## 2022-03-12 MED ORDER — OXYCODONE-ACETAMINOPHEN 5-325 MG PO TABS: 1.0000 | ORAL_TABLET | Freq: Four times a day (QID) | ORAL | 0 refills | Status: AC | PRN

## 2022-03-12 NOTE — ED Provider Notes (Signed)
?[facility] EMERGENCY DEPARTMENT ?Provider Note ? ? ?CSN: [id number] ?Arrival date & time: 03/12/22  1307 ? ?  ? ?History ? ?Chief Complaint  ?Patient presents with  ? Fall  ?  Rt hip pain  ? ? ?Nancy Newman is a 71 y.o. female. ? ?Patient is a 71 year old female with a history of hypertension, prior seizures, recent revision of right hip replacement in January who had gained about 75% weight bearing with still very weak quadricep muscles to had a fall on Tuesday when she tried to get up and use her walker without help and fell down to the floor.  She reports her knee bent and twisted in and then she fell to the ground.  She had no head injury or loss of consciousness.  However since that time she is complained of significant leg pain that is only minimally improved with oxycodone that she had leftover from her surgery.  They did go to [facility] urgent [facility] on Tuesday when she was complaining of a lot of pain.  Her family member is present at bedside who reports they x-rayed the leg and said everything with her hip looked normal and her replacement and hardware looked okay.  She was discharged home however she continues to have significant pain around her right knee and swelling in her lower leg.  Patient denies any pain in her hip but reports when she tries to stand her knee in her proximal tib-fib is very painful.  Also family member has noticed she has had worsening pain and swelling in that area as well as ecchymosis.  Patient does take 81 mg aspirin but no other anticoagulants. ? ?The history is provided by the patient.  ?Fall ? ? ?  ? ?Home Medications ?Prior to Admission medications   ?Medication Sig Start Date End Date Taking? Authorizing Provider  ?oxyCODONE-acetaminophen (PERCOCET/ROXICET) 5-325 MG tablet Take 1 tablet by mouth every 6 (six) hours as needed for severe pain. 03/12/22  Yes Gwyneth Sprout, MD  ?levETIRAcetam (KEPPRA) 100 MG/ML solution Take by mouth 2 (two) times daily.    [provider]  ?mirtazapine (REMERON SOL-TAB) 15 MG disintegrating tablet Take 15 mg by mouth at bedtime. 03/22/20   [provider]  ?triamcinolone cream (KENALOG) 0.1 % Apply 1 application topically 2 (two) times daily. 07/03/20   Arthor Captain, PA-C  ?   ? ?Allergies    ?Penicillins and Eggs or egg-derived products   ? ?Review of Systems   ?Review of Systems ? ?Physical Exam ?Updated Vital Signs ?BP (!) 174/77 (BP Location: Right Arm)   Pulse 72   Temp 98.5 ?F (36.9 ?C) (Oral)   Resp 18   Ht 4\' 11"  (1.499 m)   Wt 44 kg   SpO2 98%   BMI 19.59 kg/m?  ?Physical Exam ?Vitals and nursing note reviewed.  ?Constitutional:   ?   General: She is not in acute distress. ?   Appearance: She is well-developed.  ?HENT:  ?   Head: Normocephalic and atraumatic.  ?Eyes:  ?   Pupils: Pupils are equal, round, and reactive to light.  ?Cardiovascular:  ?   Rate and Rhythm: Normal rate.  ?Pulmonary:  ?   Effort: Pulmonary effort is normal. No respiratory distress.  ?Abdominal:  ?   General: Bowel sounds are normal. There is no distension.  ?   Palpations: Abdomen is soft.  ?   Tenderness: There is no abdominal tenderness. There is no guarding or rebound.  ?Musculoskeletal:     ?  General: Tenderness present.  ?   Right knee: Swelling, effusion, ecchymosis and bony tenderness present. Decreased range of motion. Tenderness present over the lateral joint line, MCL and LCL.  ?   Right lower leg: Swelling, tenderness and bony tenderness present.  ?     Legs: ? ?   Comments: No edema.  Notable nonpitting edema noted to the right lower leg.  Pulse is 1+ in the DP.  ?Skin: ?   General: Skin is warm and dry.  ?   Findings: No rash.  ?Neurological:  ?   Mental Status: She is alert and oriented to person, place, and time.  ?   Cranial Nerves: No cranial nerve deficit.  ?Psychiatric:     ?   Behavior: Behavior normal.  ? ? ?ED Results / Procedures / Treatments   ?Labs ?(all labs ordered are listed, but only abnormal results are  displayed) ?Labs Reviewed - No data to display ? ?EKG ?None ? ?Radiology ?DG Tibia/Fibula Right ? ?Result Date: 03/12/2022 ?CLINICAL DATA:  swelling and pain EXAM: RIGHT FEMUR 2 VIEWS; RIGHT TIBIA AND FIBULA - 2 VIEW COMPARISON:  Multiple priors FINDINGS: Right femur: Status post right hip arthroplasty. Additional external fixation hardware expands the length of the femur with multiple cerclage wires. Unchanged appearance of a fracture line of the mid femur adjacent to the distal aspect of the femoral stem. Unchanged alignment. No findings of hardware fracture. No new acute fracture identified. Right tibia and fibula: Normal alignment. No acute fracture. Osteopenia. Multiple well-defined sclerotic intramedullary lesions of the tibia, favored benign process and/or infarct. IMPRESSION: Right femur, right tibia and fibula: Stable appearance of left hip arthroplasty and extensive femoral fixation hardware, which spans an unhealed fracture of the mid femur, unchanged. No new fractures identified. Electronically Signed   By: Olive Bass M.D.   On: 03/12/2022 15:14  ? ?US Venous Img Lower Right (DVT Study) ? ?Result Date: 03/12/2022 ?CLINICAL DATA:  Right leg pain and swelling. Recent fall on 03/10/2022. EXAM: RIGHT LOWER EXTREMITY VENOUS DOPPLER ULTRASOUND TECHNIQUE: Gray-scale sonography with compression, as well as color and duplex ultrasound, were performed to evaluate the deep venous system(s) from the level of the common femoral vein through the popliteal and proximal calf veins. COMPARISON:  None. FINDINGS: VENOUS Normal compressibility of the common femoral, superficial femoral, and popliteal veins, as well as the visualized calf veins. Visualized portions of profunda femoral vein and great saphenous vein unremarkable. No filling defects to suggest DVT on grayscale or color Doppler imaging. Doppler waveforms show normal direction of venous flow, normal respiratory plasticity and response to augmentation. Limited  views of the contralateral common femoral vein are unremarkable. OTHER None. Limitations: none IMPRESSION: Negative. Electronically Signed   By: Malachy Moan M.D.   On: 03/12/2022 14:29  ? ?DG Femur Min 2 Views Right ? ?Result Date: 03/12/2022 ?CLINICAL DATA:  swelling and pain EXAM: RIGHT FEMUR 2 VIEWS; RIGHT TIBIA AND FIBULA - 2 VIEW COMPARISON:  Multiple priors FINDINGS: Right femur: Status post right hip arthroplasty. Additional external fixation hardware expands the length of the femur with multiple cerclage wires. Unchanged appearance of a fracture line of the mid femur adjacent to the distal aspect of the femoral stem. Unchanged alignment. No findings of hardware fracture. No new acute fracture identified. Right tibia and fibula: Normal alignment. No acute fracture. Osteopenia. Multiple well-defined sclerotic intramedullary lesions of the tibia, favored benign process and/or infarct. IMPRESSION: Right femur, right tibia and fibula: Stable appearance of left hip  arthroplasty and extensive femoral fixation hardware, which spans an unhealed fracture of the mid femur, unchanged. No new fractures identified. Electronically Signed   By: Olive BassYasser  El-Abd M.D.   On: 03/12/2022 15:14   ? ?Procedures ?Procedures  ? ? ?Medications Ordered in ED ?Medications - No data to display ? ?ED Course/ Medical Decision Making/ A&P ?  ?                        ?Medical Decision Making ?Amount and/or Complexity of Data Reviewed ?Radiology: ordered and independent interpretation performed. Decision-making details documented in ED Course. ? ?Risk ?Prescription drug management. ? ? ?Elderly female with prior hip replacement status post revision in January with surgical incision from her hip all the way down to her knee who was still undergoing rehab and fell on Tuesday.  Since that time she has had significant pain in her knee and proximal tib-fib area.  Patient did have x-rays at an urgent care that reportedly were negative however  failure member reports she still in significant pain and they were concerned with all the swelling.  Patient does take aspirin 81 mg daily but no other anticoagulation.  No prior history of blood clots.  Patien

## 2022-03-12 NOTE — ED Triage Notes (Signed)
Took percocet about an hour ago ?

## 2022-03-12 NOTE — ED Triage Notes (Signed)
Pt fell while ambulating Tuesday, s/p right hip revision, had xrays, stated reported no fx, hardware intact at [facility] urgent [facility]. Pt continues to be unable to put weight on right leg, swelling right lower leg below knee ?

## 2022-03-12 NOTE — Discharge Instructions (Addendum)
All the hardware and images today look normal.  Dr. Hilda Lias should be able to see all these the reads of these images through epic.  She may have just sprained her knee badly and bruised it when she fell.  She can wear the knee brace for the next week.  Elevate and ice it.  Also he can try Voltaren gel for pain control.  She may need to use her wheelchair more regularly for the next week take it easy with physical therapy.  No evidence of blood clot today. ?

## 2024-09-28 ENCOUNTER — Emergency Department (HOSPITAL_BASED_OUTPATIENT_CLINIC_OR_DEPARTMENT_OTHER)
Admission: EM | Admit: 2024-09-28 | Discharge: 2024-09-28 | Disposition: A | Discharge status: Home / Self Care | Attending: Emergency Medicine | Admitting: Emergency Medicine

## 2024-09-28 ENCOUNTER — Emergency Department (HOSPITAL_BASED_OUTPATIENT_CLINIC_OR_DEPARTMENT_OTHER)

## 2024-09-28 ENCOUNTER — Other Ambulatory Visit: Payer: Self-pay

## 2024-09-28 ENCOUNTER — Encounter (HOSPITAL_BASED_OUTPATIENT_CLINIC_OR_DEPARTMENT_OTHER): Payer: Self-pay

## 2024-09-28 DIAGNOSIS — F0390 Unspecified dementia without behavioral disturbance: Secondary | ICD-10-CM | POA: Diagnosis not present

## 2024-09-28 DIAGNOSIS — R9431 Abnormal electrocardiogram [ECG] [EKG]: Secondary | ICD-10-CM | POA: Insufficient documentation

## 2024-09-28 DIAGNOSIS — I10 Essential (primary) hypertension: Secondary | ICD-10-CM | POA: Diagnosis not present

## 2024-09-28 DIAGNOSIS — Z79899 Other long term (current) drug therapy: Secondary | ICD-10-CM | POA: Insufficient documentation

## 2024-09-28 DIAGNOSIS — R479 Unspecified speech disturbances: Secondary | ICD-10-CM

## 2024-09-28 DIAGNOSIS — R4789 Other speech disturbances: Secondary | ICD-10-CM | POA: Insufficient documentation

## 2024-09-28 DIAGNOSIS — R7309 Other abnormal glucose: Secondary | ICD-10-CM | POA: Insufficient documentation

## 2024-09-28 LAB — CBC WITH DIFFERENTIAL/PLATELET
Abs Immature Granulocytes: 0.01 K/uL (ref 0.00–0.07)
Basophils Absolute: 0 K/uL (ref 0.0–0.1)
Basophils Relative: 0 %
Eosinophils Absolute: 0.1 K/uL (ref 0.0–0.5)
Eosinophils Relative: 1 %
HCT: 32.8 % — ABNORMAL LOW (ref 36.0–46.0)
Hemoglobin: 10.7 g/dL — ABNORMAL LOW (ref 12.0–15.0)
Immature Granulocytes: 0 %
Lymphocytes Relative: 25 %
Lymphs Abs: 1.7 K/uL (ref 0.7–4.0)
MCH: 31.4 pg (ref 26.0–34.0)
MCHC: 32.6 g/dL (ref 30.0–36.0)
MCV: 96.2 fL (ref 80.0–100.0)
Monocytes Absolute: 0.6 K/uL (ref 0.1–1.0)
Monocytes Relative: 9 %
Neutro Abs: 4.3 K/uL (ref 1.7–7.7)
Neutrophils Relative %: 65 %
Platelets: 174 K/uL (ref 150–400)
RBC: 3.41 MIL/uL — ABNORMAL LOW (ref 3.87–5.11)
RDW: 13.5 % (ref 11.5–15.5)
WBC: 6.7 K/uL (ref 4.0–10.5)
nRBC: 0 % (ref 0.0–0.2)

## 2024-09-28 LAB — BASIC METABOLIC PANEL WITH GFR
Anion gap: 12 (ref 5–15)
BUN: 27 mg/dL — ABNORMAL HIGH (ref 8–23)
CO2: 16 mmol/L — ABNORMAL LOW (ref 22–32)
Calcium: 8.5 mg/dL — ABNORMAL LOW (ref 8.9–10.3)
Chloride: 114 mmol/L — ABNORMAL HIGH (ref 98–111)
Creatinine, Ser: 1.07 mg/dL — ABNORMAL HIGH (ref 0.44–1.00)
GFR, Estimated: 54 mL/min — ABNORMAL LOW (ref >60)
Glucose, Bld: 104 mg/dL — ABNORMAL HIGH (ref 70–99)
Potassium: 5.1 mmol/L (ref 3.5–5.1)
Sodium: 142 mmol/L (ref 135–145)

## 2024-09-28 LAB — COMPREHENSIVE METABOLIC PANEL WITH GFR
ALT: 21 U/L (ref 0–44)
AST: 35 U/L (ref 15–41)
Albumin: 3.6 g/dL (ref 3.5–5.0)
Alkaline Phosphatase: 187 U/L — ABNORMAL HIGH (ref 38–126)
Anion gap: 13 (ref 5–15)
BUN: 30 mg/dL — ABNORMAL HIGH (ref 8–23)
CO2: 15 mmol/L — ABNORMAL LOW (ref 22–32)
Calcium: 8.9 mg/dL (ref 8.9–10.3)
Chloride: 112 mmol/L — ABNORMAL HIGH (ref 98–111)
Creatinine, Ser: 1.17 mg/dL — ABNORMAL HIGH (ref 0.44–1.00)
GFR, Estimated: 48 mL/min — ABNORMAL LOW (ref >60)
Glucose, Bld: 135 mg/dL — ABNORMAL HIGH (ref 70–99)
Potassium: 5.7 mmol/L — ABNORMAL HIGH (ref 3.5–5.1)
Sodium: 140 mmol/L (ref 135–145)
Total Bilirubin: 0.3 mg/dL (ref 0.0–1.2)
Total Protein: 6.6 g/dL (ref 6.5–8.1)

## 2024-09-28 LAB — PROTIME-INR
INR: 1.1 (ref 0.8–1.2)
Prothrombin Time: 14.6 s (ref 11.4–15.2)

## 2024-09-28 LAB — CBG MONITORING, ED: Glucose-Capillary: 135 mg/dL — ABNORMAL HIGH (ref 70–99)

## 2024-09-28 MED ORDER — SODIUM CHLORIDE 0.9 % IV BOLUS
1000.0000 mL | Freq: Once | INTRAVENOUS | Status: AC
  Administered 2024-09-28: 1000 mL via INTRAVENOUS

## 2024-09-28 NOTE — ED Notes (Signed)
 Pt. Is at baseline per the Pt. Daughter with her speech and her reactions.  Pt. Speech is clear and she has to search for some words at time but the family reports this is her normal.

## 2024-09-28 NOTE — ED Triage Notes (Addendum)
 LKW: 2300 on 09/28/24. Pt's husband reports pt having slurred speech and being lethargic around 1000 after waking up today. Symptoms resolved quickly after once she took her morning medications and ate breakfast. Pt is mildly confused at baseline with difficulty remembering the date. Pt has hx seizures (last seizure >7 years ago) and denies missing medications. Denies hx diabetes. Denies all symptoms at this time. Denies blood thinners.

## 2024-09-28 NOTE — ED Notes (Signed)
 Slurred speech this am, speech clear presently

## 2024-09-28 NOTE — ED Provider Notes (Signed)
 [place] [facility]ERG[facility] DEPARTMENT AT [facility] Provider Note   CSN: [id number] Arrival date & time: 09/28/24  1348     Patient presents with: Aphasia   Nancy Newman is a 73 y.o. female.  {Add pertinent medical, surgical, social history, OB history to HPI:32947} HPI         Prior to Admission medications   Medication Sig Start Date End Date Taking? Authorizing Provider  levETIRAcetam (KEPPRA) 100 MG/ML solution Take by mouth 2 (two) times daily.    [provider]  mirtazapine (REMERON SOL-TAB) 15 MG disintegrating tablet Take 15 mg by mouth at bedtime. 03/22/20   [provider]  oxyCODONE -acetaminophen  (PERCOCET/ROXICET) 5-325 MG tablet Take 1 tablet by mouth every 6 (six) hours as needed for severe pain. 03/12/22   Doretha Folks, MD  triamcinolone  cream (KENALOG ) 0.1 % Apply 1 application topically 2 (two) times daily. 07/03/20   Harris, Abigail, PA-C    Allergies: Penicillins and Egg-derived products    Review of Systems  Updated Vital Signs BP (!) 140/57   Pulse 68   Temp 98.1 F (36.7 C) (Oral)   Resp 20   Ht 4' 11 (1.499 m)   Wt 55.3 kg   SpO2 100%   BMI 24.64 kg/m   Physical Exam Vitals and nursing note reviewed.  Constitutional:      General: She is not in acute distress. HENT:     Head: Normocephalic and atraumatic.     Nose: Nose normal.  Eyes:     General: No scleral icterus. Cardiovascular:     Rate and Rhythm: Normal rate and regular rhythm.     Pulses: Normal pulses.     Heart sounds: Normal heart sounds.  Pulmonary:     Effort: Pulmonary effort is normal. No respiratory distress.     Breath sounds: No wheezing.  Abdominal:     Palpations: Abdomen is soft.     Tenderness: There is no abdominal tenderness.  Musculoskeletal:     Cervical back: Normal range of motion.     Right lower leg: No edema.     Left lower leg: No edema.  Skin:    General: Skin is warm and dry.     Capillary Refill: Capillary refill  takes less than 2 seconds.  Neurological:     Mental Status: She is alert. Mental status is at baseline.     Comments: Oriented to self which is baseline for patient  Alert and oriented to self, place, time and event.   Speech is fluent, clear without dysarthria or dysphasia.   Strength 5/5 in upper/lower extremities   Sensation intact in upper/lower extremities   Normal gait.  CN I not tested  CN II grossly intact visual fields bilaterally. Did not visualize posterior eye.  CN III, IV, VI PERRLA and EOMs intact bilaterally  CN V Intact sensation to sharp and light touch to the face  CN VII facial movements symmetric  CN VIII not tested  CN IX, X no uvula deviation, symmetric rise of soft palate  CN XI 5/5 SCM and trapezius strength bilaterally  CN XII Midline tongue protrusion, symmetric L/R movements   Psychiatric:        Mood and Affect: Mood normal.        Behavior: Behavior normal.     (all labs ordered are listed, but only abnormal results are displayed) Labs Reviewed  CBC WITH DIFFERENTIAL/PLATELET - Abnormal; Notable for the following components:      Result  Value   RBC 3.41 (*)    Hemoglobin 10.7 (*)    HCT 32.8 (*)    All other components within normal limits  COMPREHENSIVE METABOLIC PANEL WITH GFR - Abnormal; Notable for the following components:   Potassium 5.7 (*)    Chloride 112 (*)    CO2 15 (*)    Glucose, Bld 135 (*)    BUN 30 (*)    Creatinine, Ser 1.17 (*)    Alkaline Phosphatase 187 (*)    GFR, Estimated 48 (*)    All other components within normal limits  CBG MONITORING, ED - Abnormal; Notable for the following components:   Glucose-Capillary 135 (*)    All other components within normal limits  PROTIME-INR  BASIC METABOLIC PANEL WITH GFR    EKG: EKG Interpretation Date/Time:  Thursday September 28 2024 14:28:30 EDT Ventricular Rate:  70 PR Interval:  152 QRS Duration:  81 QT Interval:  381 QTC Calculation: 412 R Axis:   22  Text  Interpretation: Sinus rhythm Low voltage, precordial leads Borderline T abnormalities, anterior leads Minimal ST elevation, inferior leads Confirmed by Towana Sharper [phone]) on 09/28/2024 2:34:53 PM  Radiology: CT Head Wo Contrast Result Date: 09/28/2024 EXAM: CT HEAD WITHOUT CONTRAST 09/28/2024 03:13:16 PM TECHNIQUE: CT of the head was performed without the administration of intravenous contrast. Automated exposure control, iterative reconstruction, and/or weight based adjustment of the mA/kV was utilized to reduce the radiation dose to as low as reasonably achievable. COMPARISON: 03/19/2015 CLINICAL HISTORY: Neuro deficit, acute, stroke suspected; brief episode earlier of slurred speech no sx now. Pt's husband reports pt having slurred speech and being lethargic around 1000 after waking up today. Symptoms resolved quickly after once she took her morning medications and ate breakfast. Pt is mildly confused at baseline with difficulty remembering the date. Pt has hx seizures (last seizure >7 years ago). FINDINGS: BRAIN AND VENTRICLES: No acute hemorrhage. No evidence of acute infarct. 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 No hydrocephalus. No extra-axial collection. No mass effect or midline shift. ORBITS: No acute abnormality. SINUSES: No acute abnormality. SOFT TISSUES AND SKULL: No acute soft tissue abnormality. No skull fracture. IMPRESSION: 1. No acute intracranial abnormality. Electronically signed by: Sharper Daring MD 09/28/2024 03:31 PM EDT RP Workstation: [id number]    {Document cardiac monitor, telemetry assessment procedure when appropriate:32947} .Ultrasound ED Peripheral IV (Provider)  Date/Time: 09/28/2024 4:24 PM  Performed by: Neldon Hamp RAMAN, PA Authorized by: Neldon Hamp RAMAN, PA   Procedure details:    Indications: multiple failed IV attempts     Skin Prep: chlorhexidine gluconate     Location:  Right AC   Angiocath:  20 G   Bedside Ultrasound  Guided: Yes     Images: not archived     Patient tolerated procedure without complications: Yes     Dressing applied: Yes      Medications Ordered in the ED  sodium chloride  0.9 % bolus 1,000 mL (has no administration in time range)      {Click here for ABCD2, HEART and other calculators REFRESH Note before signing:1}                              Medical Decision Making Amount and/or Complexity of Data Reviewed Labs: ordered. Radiology: ordered.   ***  {Document critical care time when appropriate  Document review of labs and clinical decision tools ie CHADS2VASC2, etc  Document your independent  review of radiology images and any outside records  Document your discussion with family members, caretakers and with consultants  Document social determinants of health affecting pt's care  Document your decision making why or why not admission, treatments were needed:32947:::1}   Final diagnoses:  None    ED Discharge Orders     None

## 2024-09-28 NOTE — Discharge Instructions (Addendum)
 Please follow-up with your primary care provider.  Let them know that you were seen in the emergency department and that while your symptoms did not sound like you had a stroke/TIA they are concerning enough that we recommend that you have a TIA workup which includes an ultrasound of your carotid arteries in your heart.  Please continue taking your daily aspirin.  Return to the emergency room/call 911 if you have any slurring of your speech atrial droop limb weakness vision changes otherwise continue monitoring your symptoms and see your primary care provider.

## 2024-09-28 NOTE — ED Notes (Signed)
 IV attempt x1, left forearm unsuccessful, patient tol well, PA at bedside to place USIV.
